# Patient Record
Sex: Male | Born: 1965 | Race: White | Hispanic: No | Marital: Married | Smoking: Current every day smoker
Health system: Southern US, Community
[De-identification: ages and names within clinical notes are randomized; demographics above are authoritative.]

## PROBLEM LIST (undated history)

## (undated) DIAGNOSIS — G8929 Other chronic pain: Secondary | ICD-10-CM

## (undated) DIAGNOSIS — Z87442 Personal history of urinary calculi: Secondary | ICD-10-CM

## (undated) DIAGNOSIS — Z720 Tobacco use: Secondary | ICD-10-CM

## (undated) DIAGNOSIS — M5432 Sciatica, left side: Secondary | ICD-10-CM

## (undated) DIAGNOSIS — I10 Essential (primary) hypertension: Secondary | ICD-10-CM

## (undated) DIAGNOSIS — E785 Hyperlipidemia, unspecified: Secondary | ICD-10-CM

## (undated) DIAGNOSIS — E669 Obesity, unspecified: Secondary | ICD-10-CM

## (undated) DIAGNOSIS — K5790 Diverticulosis of intestine, part unspecified, without perforation or abscess without bleeding: Secondary | ICD-10-CM

## (undated) DIAGNOSIS — IMO0001 Reserved for inherently not codable concepts without codable children: Secondary | ICD-10-CM

## (undated) DIAGNOSIS — M549 Dorsalgia, unspecified: Secondary | ICD-10-CM

## (undated) HISTORY — DX: Diverticulosis of intestine, part unspecified, without perforation or abscess without bleeding: K57.90

## (undated) HISTORY — PX: KNEE ARTHROSCOPY: SUR90

## (undated) HISTORY — DX: Hyperlipidemia, unspecified: E78.5

## (undated) HISTORY — PX: WISDOM TOOTH EXTRACTION: SHX21

## (undated) HISTORY — DX: Essential (primary) hypertension: I10

## (undated) HISTORY — DX: Tobacco use: Z72.0

## (undated) HISTORY — DX: Obesity, unspecified: E66.9

## (undated) HISTORY — PX: PARTIAL COLECTOMY: SHX5273

## (undated) HISTORY — PX: COLOSTOMY REVERSAL: SHX5782

---

## 2000-08-27 ENCOUNTER — Ambulatory Visit (HOSPITAL_COMMUNITY): Admission: RE | Admit: 2000-08-27 | Discharge: 2000-08-27 | Payer: Self-pay | Admitting: Family Medicine

## 2000-08-27 ENCOUNTER — Encounter: Payer: Self-pay | Admitting: Family Medicine

## 2000-10-22 ENCOUNTER — Ambulatory Visit (HOSPITAL_COMMUNITY): Admission: RE | Admit: 2000-10-22 | Discharge: 2000-10-22 | Payer: Self-pay | Admitting: Orthopaedic Surgery

## 2000-10-22 ENCOUNTER — Encounter: Payer: Self-pay | Admitting: Orthopaedic Surgery

## 2003-03-10 ENCOUNTER — Ambulatory Visit (HOSPITAL_COMMUNITY): Admission: RE | Admit: 2003-03-10 | Discharge: 2003-03-10 | Payer: Self-pay | Admitting: Internal Medicine

## 2003-04-16 ENCOUNTER — Ambulatory Visit (HOSPITAL_COMMUNITY): Admission: RE | Admit: 2003-04-16 | Discharge: 2003-04-16 | Payer: Self-pay | Admitting: Internal Medicine

## 2003-04-29 ENCOUNTER — Ambulatory Visit (HOSPITAL_COMMUNITY): Admission: RE | Admit: 2003-04-29 | Discharge: 2003-04-29 | Payer: Self-pay | Admitting: Internal Medicine

## 2003-04-30 ENCOUNTER — Inpatient Hospital Stay (HOSPITAL_COMMUNITY): Admission: AD | Admit: 2003-04-30 | Discharge: 2003-05-13 | Payer: Self-pay | Admitting: General Surgery

## 2007-07-01 ENCOUNTER — Ambulatory Visit (HOSPITAL_COMMUNITY): Admission: RE | Admit: 2007-07-01 | Discharge: 2007-07-01 | Payer: Self-pay | Admitting: Family Medicine

## 2007-07-01 ENCOUNTER — Encounter: Payer: Self-pay | Admitting: Orthopedic Surgery

## 2007-08-05 ENCOUNTER — Ambulatory Visit: Payer: Self-pay | Admitting: Orthopedic Surgery

## 2007-10-03 ENCOUNTER — Ambulatory Visit (HOSPITAL_COMMUNITY): Admission: RE | Admit: 2007-10-03 | Discharge: 2007-10-03 | Payer: Self-pay | Admitting: Family Medicine

## 2010-02-19 ENCOUNTER — Encounter (INDEPENDENT_AMBULATORY_CARE_PROVIDER_SITE_OTHER): Payer: Self-pay | Admitting: Internal Medicine

## 2010-06-17 NOTE — H&P (Signed)
NAME:  Brent Nguyen, Brent Nguyen                          ACCOUNT NO.:  [id number]   MEDICAL RECORD NO.:  [id number]                   PATIENT TYPE:  OUT   LOCATION:  RAD                                  FACILITY:  [facility]   PHYSICIAN:  Lionel December, M.D.                 DATE OF BIRTH:  11-Jun-1965   DATE OF ADMISSION:  04/16/2003  DATE OF DISCHARGE:  04/16/2003                                HISTORY & PHYSICAL   CHIEF COMPLAINT:  Follow for left lower quadrant abdominal pain and  thickened sigmoid colon.   HISTORY OF PRESENT ILLNESS:  Brent Nguyen is a 45 year old Caucasian male who was  initially seen in our office on March 12, 2003, for lower-abdominal pain.  He had an abdominal pelvic CT which showed thickening and edema to the  sigmoid colon with diverticula and a focal area of edema felt to be  contained perforation as well as thickening of adjacent bladder wall  posterosuperiorly.  He was treated with Cipro and Flagyl.  By the time I saw  him March 12, 2003, he was feeling a lot better.  He was advised to  continue antibiotic therapy for a total of two weeks.  He states that he did  fine until one week ago when he developed pain in his left lower-quadrant.  That evening, he also had multiple bowel movements.  He passed bits and  pieces of stool along with some blood.  His pain persisted for three days.  On day #3, he took Vicodin and felt better.  He states by the weekend, he  was completely pain free, and he went on a trip with his son.  As planned,  he had an abdominal CT on March 17, 200, the results of which are reviewed  below.  He is now back to having normal bowel movements.  He has a good  appetite.  He has not experienced any fever, chills, nausea or vomiting.  His weight remains unchanged.  He is on Vicodin 5/500 t.i.d. p.r.n. but he  does not take it daily.   PAST MEDICAL HISTORY:  He was evaluated in December of 1999 with colonoscopy  and ileoscopy.  At that time, he presented  with an episode of abdominal  pain, nausea, vomiting, fever, and rectal bleeding.  Colonoscopy was normal.  He had focal areas of edema and petechia of the sigmoid colon felt to be  insignificant.  Biopsy revealed mild increase in chronic inflammatory cells,  but no granulomas or crypt abscesses.  TI appeared to be unusual.  Biopsies  revealed intact villous ____, mild increase in chronic inflammatory cells.  It was decided to just watch him.  He did not have any problem until  recently.   Left knee arthroscopy.   FAMILY HISTORY:  Negative for colorectal carcinoma.  Mother had hypertension  and diabetes mellitus.  Father died of brain cancer at age 90.  SOCIAL HISTORY:  He is married.  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  He smokes 1-1/2 to 2 packs a day.  He drinks alcohol very  occasionally.  He has one daughter.   PHYSICAL EXAMINATION:  VITAL SIGNS:  Weight 213 pounds.  He is 73 inches  tall.  Pulse 88 per minute.  Blood pressure 100/72.  He is afebrile.  HEENT:  Conjunctivae is pink.  Sclerae is nonicteric.  No neck masses are  noted.  CARDIAC:  Regular rhythm, normal S1, S2.  No murmur or gallop noted.  LUNGS:  Clear to auscultation.  ABDOMEN:  Symmetrical.  Bowel sounds are normal.  Palpation reveals soft  abdomen with palpable, mildly-tender sigmoid colon.  No hepatosplenomegaly  noted.  RECTAL:  Exam is deferred.  EXTREMITIES:  He does not have clubbing or gross edema.   STUDIES:  CT from April 16, 2003, shows marked persistent bowel wall  thickening associated with sigmoid colon in the midst of diverticula.  This  has not changed since previous study of February.  The bladder wall remains  thickened.   ASSESSMENT:  Brent Nguyen is a 45 year old Caucasian male who has complicated  diverticulitis.  He is mildly tender in his left lower-quadrant, and he had  a three-day episode of left lower quadrant abdominal pain last week.  I  suspect he had wall perforation  last month.  He certainly could have Crohn's  disease in this area.  I feel he may need sigmoid colon resection unless he  has inflammatory bowel disease.  He needs to have colonoscopy in the near  future.   RECOMMENDATIONS:  We will restart him on Cipro 500 mg p.o. b.i.d. and Flagyl  500 mg p.o. b.i.d.  A prescription was given for 30 of each, along with  Vicodin 5/500 one t.i.d. p.r.n. #20, without refill.  He will undergo total  colonoscopy early part of next week.  I have reviewed the procedure and  risks with the patient, and he is agreeable.     ___________________________________________                                         Lionel December, M.D.   NR/MEDQ  D:  04/20/2003  T:  04/21/2003  Job:  [id number]   cc:   Angus G. Renard Matter, M.D.  [address]  [place]  [place] [address]  Fax: [phone]

## 2010-06-17 NOTE — Discharge Summary (Signed)
NAME:  Brent Nguyen, Brent Nguyen                          ACCOUNT NO.:  [id number]   MEDICAL RECORD NO.:  [id number]                   PATIENT TYPE:  INP   LOCATION:  IC08                                 FACILITY:  [facility]   PHYSICIAN:  Barbaraann Barthel, M.D.              DATE OF BIRTH:  Jan 09, 1966   DATE OF ADMISSION:  04/30/2003  DATE OF DISCHARGE:  05/13/2003                                 DISCHARGE SUMMARY   DIAGNOSES:  Diverticular disease.   PROCEDURE:  On May 01, 2003 sigmoid resection (low anterior resection with  end-to-end anastomosis) and on May 06, 2003 exploratory laparotomy with  sigmoid resection and colostomy and rectal stump and peritoneal lavage.   COMPLICATIONS:  Anastomotic leak.   NOTE:  This is a 45 year old white male with severe diverticular disease.  Prior to surgery he has had at least 6 episodes of recurrent diverticulitis  with bleeding and he had undergone a colonoscopy on April 29, 2003 and the  GI service had consulted [place] and we had planned for an elective sigmoid  resection due to the severity of his diverticular problems.   He was taken to surgery after appropriate antibiotic and mechanical prep and  an end-to-end anastomosis was carried out using the EEA.  This appeared  uneventful during surgery; however, on the sixth postoperative day when he  was endeavoring to have a bowel movement he became hypotensive and slightly  distended and I was fearful that he possibly had a leak.  This was confirmed  by a Gastrografin enema which showed an anastomotic leak.  He was taken to  surgery and a diverting colostomy and lavage and a resection of his area of  anastomosis was carried out.  He has essentially an end-colostomy, a  Hartmann procedure.   Postoperatively he did well after his second surgery.  His wound remained  clean without infection or any signs of dehiscence or problems of that  nature.  The stoma site was in good shape and his Jackson-Pratt drain had  minimal serosanguineous drainage.  His drains, his JP and his NG-tube were  removed on the fourth postoperative day of his second surgery.  His diet and  activity was advanced as tolerated.  I did keep him in the intensive care  unit as I wanted to watch him closely for any of these problems.  My main  worry was that he was a severe smoker and the problems of having a  dehiscence in this setting are certainly possible; however, he has not  manifested any of those clinical symptoms.  His wound has remained clean  without drainage and his stoma continues to function well and he is  tolerating p.o. well and remains afebrile with a good colostomy function.   LABORATORY DATA:  His last white count was 8.7 with an H&H of 12.8 and 36.6.  His albumin was 2.5.  His magnesium is 1.7 and he  had some mild hyponatremia  at 137, glucose 127, BUN 7 and creatinine 0.7.  His pathology from his first  surgery showed diverticulosis with an area of diverticulitis noted.  The  Gastrografin enema which was performed when we suspected the possibility of  a leak showed an anastomotic leak after which he was taken directly to  surgery.  He remained stable in the intensive care unit and has done very  well and was discharged on the seventh postoperative day from his second  surgery.   DISCHARGE INSTRUCTIONS:  He has been instructed as to colostomy care.  He is  told to clean his wound with alcohol 2 or 3 times a day.  We will follow up  with him in office.  He is told to take a multivitamin daily and to remain  well hydrated and encouraged to continue a nourishing diet.  He was given  Darvocet-N 100 one tab p.o. q.4h. p.r.n. pain.  He is off antibiotics at  present; and I see no reason to restart these as he is asymptomatic and he  had a good parenteral course of antibiotics perioperatively.  We will follow  him postoperatively with plans for a reanastomotic attempt in a couple of  months time.  We discussed this  in detail with him and his family and they  are told to contact me should they have any problems.     ___________________________________________                                         Barbaraann Barthel, M.D.   WB/MEDQ  D:  05/13/2003  T:  05/14/2003  Job:  [id number]   cc:   Barbaraann Barthel, M.D.  [address]  [place]  [place] [address]  Fax: [phone]   Lionel December, M.D.  [address]  [place]  [place] [address]  Fax: [phone]

## 2010-06-17 NOTE — Op Note (Signed)
NAME:  Brent Nguyen, Brent Nguyen                          ACCOUNT NO.:  [id number]   MEDICAL RECORD NO.:  [id number]                   PATIENT TYPE:  INP   LOCATION:  IC04                                 FACILITY:  [facility]   PHYSICIAN:  Barbaraann Barthel, M.D.              DATE OF BIRTH:  Aug 13, 1965   DATE OF PROCEDURE:  DATE OF DISCHARGE:                                 OPERATIVE REPORT   PREOPERATIVE DIAGNOSIS:  Recurrent diverticular disease.   POSTOPERATIVE DIAGNOSIS:  Recurrent diverticular disease.   PROCEDURE:  Sigmoid resection, low anterior resection with end-to-end  anastomosis EEA stapled anastomosis.   SURGEON:  Barbaraann Barthel, M.D.   NOTE:  This is a 45 year old, white male who has had at least 6 years of  problems with left lower quadrant pain with at least 2 bouts of diverticular  disease each month.  He had just undergone colonoscopy.  He was referred by  the GI service for elective sigmoid resection, after his colonoscopy, and he  came to my office.  He was somewhat tender in his left lower quadrant and  decided that he would undergo surgery after discussing complications, not  limited to, but including bleeding, infection, anastomotic leaks and the  possibility of colostomy.  Informed consent was obtained.  He was admitted,  as he had undergone a mechanical prep by Dr. Karilyn Cota the day preceding. We  did repeat that with Fleet's Phospho-Soda and gave him IV parenteral  antibiotics on his admission.  His white count was normal; he was afebrile,  we took him to surgery with the plans for an elective resection and primary  anastomosis if feasible.   GROSS OPERATIVE FINDINGS:  The patient had an area of thickened proximal  sigmoid colon; this was adhesed to the abdominal wall and it looks like, in  the past, that he had had a localized perforation in this area.  There was  no perforation at present and no active disease.  I then deemed it possible  to go ahead and attempt a  primary anastomosis.   SPECIMEN:  Proximal sigmoid colon.   DESCRIPTION OF PROCEDURE:  The patient was placed in the lithotomy position  after prepping the abdomen with Betadine solution and draping in the usual  manner and placing a Foley catheter aseptically.  A midline incision was  carried out from the pubic symphysis to the umbilicus. Again, subcutaneous  tissue was incised.  The abdomen was opened in the midline and explored with  the above findings.  The rest of the abdominal exploration was normal.   The adhesions were taken down involving the proximal sigmoid colon,  carefully and then resecting this bowel in a closed manner using a GIA  stapling device and a TA-55 stapling device to maintain no spillage.  We  freed up the left colon to give [place] more distance.  We were not able to do a  side-to-side anastomosis  as this was a low anterior resection and resected  the sigmoid almost at the pelvic brim.   After removing this specimen, and ligating the sigmoid vessels with 2-0 silk  we then placed the EEA stapling device from below in good position after  incising this to 29 mm and then placing this in the EEA device through the  rectum.  We then used the pursestring stapling device for the proximal  portion, placed the proximal cap in this pursestring, sutured the  pursestring tightly and then fired the staples with good result.  We filled  the pelvis with water, insufflated water through the rectum.  There was no  air leak noted.  Then we tacked some fat over the anastomosis, changed  gloves again, and then after irrigation closed the abdomen with #0  monofilament nonabsorbable sutures.  The subcu was irrigated and the skin  was  approximated with a staple device.  Prior to closure, all sponge,  needle, and instrument counts were found to be correct.  Estimated blood  loss was perhaps 500 cc.  The patient received 2300 cc of crystalloids.  There were no complications.       ___________________________________________                                            Barbaraann Barthel, M.D.   WB/MEDQ  D:  05/01/2003  T:  05/01/2003  Job:  [id number]   cc:   Barbaraann Barthel, M.D.  [address]  [place]  [place] [address]  Fax: [phone]   Lionel December, M.D.  [address]  [place]  [place] [address]  Fax: [phone]

## 2010-06-17 NOTE — Op Note (Signed)
NAME:  Brent Nguyen, Brent Nguyen                          ACCOUNT NO.:  [id number]   MEDICAL RECORD NO.:  [id number]                   PATIENT TYPE:  INP   LOCATION:  IC08                                 FACILITY:  [facility]   PHYSICIAN:  Barbaraann Barthel, M.D.              DATE OF BIRTH:  Aug 25, 1965   DATE OF PROCEDURE:  DATE OF DISCHARGE:                                 OPERATIVE REPORT   PREOPERATIVE DIAGNOSIS:  Status post low anterior resection for recurrent  diverticular disease and leaking anastomosis on postop day 5.   POSTOPERATIVE DIAGNOSIS:  Status post low anterior resection for recurrent  diverticular disease and leaking anastomosis on postop day 5.   PROCEDURE:  1. Exploratory laparotomy.  2. Sigmoid resection with end colostomy and abdominal and rectal stump     lavage.   SURGEON:  Barbaraann Barthel, M.D.   NOTE:  This is a 45 year old white male who was apparently doing well  postoperatively after a low anterior resection.  It was uneventful using an  EEA stapling device 5 days previously.  This morning he complained of some  testicular discomfort; did not have any particular abdominal problems; and  his abdomen was soft when I saw him and he was doing well.  Later on this  afternoon; however, he was trying to have a bowel movement, became  diaphoretic.  When I examined him this episode had passed fluid with  administration and he was afebrile; however, his abdomen appeared mildly  distended to me and it appeared a little more tender than, I thought, was  normal.  I obtained a Gastrografin enema to make sure that we did not have  an anastomotic leak; and this was indeed confirmed that we did have a leak.  We therefore reinserted the NG tube and initiated antibiotic therapy and he  was taken to surgery.   GROSS OPERATIVE FINDINGS:  The patient had a leak in the posterior part of  the EEA anastomosis site.  There was no gross fecal spillage, but there was  Gastrografin noted  emanating from this leak.  There was some mild fibrinous  exudate around the terminal ileum.  There was no abscess formation.  This  appeared to be a fairly recent leak with considerable edema and inflammation  around the area of the previous anastomosis.   TECHNIQUE:  The patient was placed in the supine position and after the  adequate administration of anesthesia he was prepped with Betadine solution  and draped in the usual manner.  The staples were removed prior to prepping  and draping.  The wound was then opened and irrigated and we opened the  abdominal cavity with the above findings.  I placed a TA 55 stapler over the  anastomosis and oversewed the rectal stump after irrigating the rectal stump  through the perforation site.   The anastomosis site was then likewise stapled with a DA-55 removing a  small  portion of sigmoid colon and then we placed this though an opening laterally  for the colostomy site.  There was considerable edema and it was somewhat  difficult to get this through the fascial opening.  We did, however, do  this.  We had had to free up the left colon, somewhat, as well.  After  irrigating and checking for hemostasis we then closed the abdomen after  copious irrigation after placing 2 JP drains along side the rectal stump.  We then closed the fascia with interrupted #0 Prolene sutures.  We loosely  stapled the skin placing saline soaked iodoform ribbon gauze between the  staple line and closing the wound with an OpSite dressing.   Then, after the wound was isolated we then matured the colostomy. We tacked  the colostomy to the fascia with 3-0 GI silk and then matured the colostomy  with 4-0 chromic.  A Hollister appliance was applied.  The drain sites were  sutured in place with 3-0 Nylon.  This small portion of sigmoid colon was  not sent for a specimen.  Prior to closure all sponge, needle, and  instrument counts were found to be correct.  The patient  received  approximately 3100 cc of crystalloids intraoperatively.  As stated, 2 drains  were placed in the pelvis.  There were no complications to endotracheal  intubation.   The patient was taken to the recovery room in satisfactory condition.  Postoperatively his H&H was 14.9 and 43.8.  His blood pressure was 141/89  and his heart rate was 116.  We will following him closely in the intensive  care unit.      ___________________________________________                                            Barbaraann Barthel, M.D.   WB/MEDQ  D:  05/06/2003  T:  05/07/2003  Job:  [id number]   cc:   Barbaraann Barthel, M.D.  [address]  [place]  [place] [address]  Fax: [phone]   Lionel December, M.D.  [address]    [place] [address]  Fax: [phone]

## 2010-06-17 NOTE — Op Note (Signed)
NAME:  Brent Nguyen, Brent Nguyen                          ACCOUNT NO.:  [id number]   MEDICAL RECORD NO.:  [id number]                   PATIENT TYPE:  AMB   LOCATION:  DAY                                  FACILITY:  [facility]   PHYSICIAN:  Lionel December, M.D.                 DATE OF BIRTH:  1965-04-24   DATE OF PROCEDURE:  04/29/2003  DATE OF DISCHARGE:                                 OPERATIVE REPORT   PROCEDURE:  Total colonoscopy.   INDICATIONS FOR PROCEDURE:  Brent Nguyen is a 45 year old Caucasian male with  recurrent left lower quadrant pain and an abnormal abdominopelvic CT.  He  was felt to have diverticulitis and treated with a two-week course of Cipro  and Flagyl.  He had a followup CT, and thickening to the sigmoid colon and  bladder wall was unchanged.  He was retreated when seen last week.  He is  undergoing colonoscopy to make sure he does not have a neoplastic process or  inflammatory bowel disease.  The procedure and risks were reviewed with the  patient, and informed consent was obtained.   PREOPERATIVE MEDICATIONS:  Demerol 50 mg IV, Versed 5 mg IV in divided dose.   FINDINGS:  The procedure was performed in the endoscopy suite.  The  patient's vital signs and O2 saturations were monitored during the procedure  and remained stable.  The patient was placed in the left lateral recumbent  position and rectal examination performed.  No abnormality noted on external  or digital exam.  The Olympus videoscope was placed into the rectum and  advanced into the region of the sigmoid colon where there were focal areas  of mucosal edema and intense erythema along with scattered diverticula.  The  rest of the mucosa was normal but was spastic.  There was no stricture or  mass.  The scope was advanced to the descending colon and advanced without  any difficulty into the cecum which was identified by the appendiceal  orifice and ileocecal valve.  A short segment of TI was also examined and  was  normal.  As the scope was withdrawn, the colonic mucosa was once again  carefully examined.  There were no other abnormalities, except in the  sigmoid colon.  Biopsy was taken from this abnormal mucosa at the sigmoid  colon.  The rectal mucosa was normal.  The scope was retroflexed to examine  the anorectal junction which was unremarkable.  The endoscope was  straightened and withdrawn.  The patient tolerated the procedure well.   FINAL DIAGNOSES:  1. Normal terminal ileum.  2. Sigmoid colon diverticulosis with spastic segment and focal areas of     colitis, i.e., mucosal edema and erythema.  This change is possibly     nonspecific.  Biopsy taken to make sure this is not Crohn's disease.   RECOMMENDATIONS:  Will leave him on Cipro and Flagyl and get a  surgical  consultation for sigmoid colon resection.      ___________________________________________                                            Lionel December, M.D.   NR/MEDQ  D:  04/29/2003  T:  04/29/2003  Job:  [id number]   cc:   Angus G. Renard Matter, M.D.  [address]  [place]  [place] [address]  Fax: [phone]

## 2012-05-02 ENCOUNTER — Ambulatory Visit (HOSPITAL_COMMUNITY)
Admission: RE | Admit: 2012-05-02 | Discharge: 2012-05-02 | Disposition: A | Discharge status: Home / Self Care | Payer: BC Managed Care – PPO | Source: Ambulatory Visit | Attending: Family Medicine | Admitting: Family Medicine

## 2012-05-02 ENCOUNTER — Other Ambulatory Visit (HOSPITAL_COMMUNITY): Payer: Self-pay | Admitting: Family Medicine

## 2012-05-02 DIAGNOSIS — M546 Pain in thoracic spine: Secondary | ICD-10-CM | POA: Insufficient documentation

## 2012-05-02 DIAGNOSIS — M545 Low back pain, unspecified: Secondary | ICD-10-CM | POA: Insufficient documentation

## 2012-05-02 DIAGNOSIS — M51379 Other intervertebral disc degeneration, lumbosacral region without mention of lumbar back pain or lower extremity pain: Secondary | ICD-10-CM | POA: Insufficient documentation

## 2012-05-02 DIAGNOSIS — M5137 Other intervertebral disc degeneration, lumbosacral region: Secondary | ICD-10-CM | POA: Insufficient documentation

## 2012-06-11 ENCOUNTER — Other Ambulatory Visit (HOSPITAL_COMMUNITY): Payer: Self-pay | Admitting: Family Medicine

## 2012-06-11 DIAGNOSIS — M545 Low back pain: Secondary | ICD-10-CM

## 2012-06-13 ENCOUNTER — Ambulatory Visit (HOSPITAL_COMMUNITY)
Admission: RE | Admit: 2012-06-13 | Discharge: 2012-06-13 | Disposition: A | Discharge status: Home / Self Care | Payer: BC Managed Care – PPO | Source: Ambulatory Visit | Attending: Family Medicine | Admitting: Family Medicine

## 2012-06-13 DIAGNOSIS — M545 Low back pain, unspecified: Secondary | ICD-10-CM | POA: Insufficient documentation

## 2012-06-13 DIAGNOSIS — M5126 Other intervertebral disc displacement, lumbar region: Secondary | ICD-10-CM | POA: Insufficient documentation

## 2012-06-13 DIAGNOSIS — M79609 Pain in unspecified limb: Secondary | ICD-10-CM | POA: Insufficient documentation

## 2012-06-19 ENCOUNTER — Other Ambulatory Visit: Payer: Self-pay | Admitting: Family Medicine

## 2012-06-19 DIAGNOSIS — M545 Low back pain: Secondary | ICD-10-CM

## 2012-07-01 ENCOUNTER — Ambulatory Visit
Admission: RE | Admit: 2012-07-01 | Discharge: 2012-07-01 | Disposition: A | Discharge status: Home / Self Care | Payer: BC Managed Care – PPO | Source: Ambulatory Visit | Attending: Family Medicine | Admitting: Family Medicine

## 2012-07-01 ENCOUNTER — Other Ambulatory Visit: Payer: Self-pay | Admitting: Family Medicine

## 2012-07-01 DIAGNOSIS — M545 Low back pain, unspecified: Secondary | ICD-10-CM

## 2012-07-01 MED ORDER — IOHEXOL 180 MG/ML  SOLN
1.0000 mL | Freq: Once | INTRAMUSCULAR | Status: AC | PRN
  Administered 2012-07-01: 1 mL via EPIDURAL

## 2012-07-01 MED ORDER — METHYLPREDNISOLONE ACETATE 40 MG/ML INJ SUSP (RADIOLOG
120.0000 mg | Freq: Once | INTRAMUSCULAR | Status: AC
  Administered 2012-07-01: 120 mg via EPIDURAL

## 2012-07-01 NOTE — Discharge Instructions (Signed)

## 2012-09-12 ENCOUNTER — Encounter: Payer: Self-pay | Admitting: Cardiovascular Disease

## 2012-09-12 ENCOUNTER — Ambulatory Visit (INDEPENDENT_AMBULATORY_CARE_PROVIDER_SITE_OTHER): Payer: BC Managed Care – PPO | Admitting: Cardiovascular Disease

## 2012-09-12 VITALS — BP 146/88 | HR 72 | Ht 73.0 in | Wt 231.3 lb

## 2012-09-12 DIAGNOSIS — Z720 Tobacco use: Secondary | ICD-10-CM

## 2012-09-12 DIAGNOSIS — R079 Chest pain, unspecified: Secondary | ICD-10-CM

## 2012-09-12 DIAGNOSIS — E669 Obesity, unspecified: Secondary | ICD-10-CM

## 2012-09-12 DIAGNOSIS — F17200 Nicotine dependence, unspecified, uncomplicated: Secondary | ICD-10-CM

## 2012-09-12 DIAGNOSIS — I10 Essential (primary) hypertension: Secondary | ICD-10-CM

## 2012-09-12 MED ORDER — CARVEDILOL 6.25 MG PO TABS: 6.2500 mg | ORAL_TABLET | Freq: Two times a day (BID) | ORAL | Status: DC

## 2012-09-12 NOTE — Patient Instructions (Addendum)
Start Carvedilol 6.25mg  twice a day.  Your physician recommends that you schedule a follow-up appointment in: *One Month

## 2012-09-13 ENCOUNTER — Telehealth: Payer: Self-pay | Admitting: Cardiovascular Disease

## 2012-09-13 NOTE — Telephone Encounter (Signed)
Call to pt.  Pt informed call received from Dr. Renard Matter' office asking for a note he was seen yesterday.  Pt stated he did call them b/c he needed a letter from them stating he saw them last week and they referred him to our office.  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  Pt informed RN will call Dr. Renard Matter' office back and inform them.  Pt verbalized understanding and agreed w/ plan.  No phone number left for Dr. Lorenso Courier office.  Call to pt and number given: [phone]  Returned call and spoke w/ Ginger.  Informed per pt.  Verbalized understanding and stated they will take care of it.

## 2012-09-13 NOTE — Telephone Encounter (Signed)
Dr. Renard Matter office needs a note stating that Brent Nguyen was seen here on 09/12/12 for his employer.

## 2012-09-17 ENCOUNTER — Encounter: Payer: Self-pay | Admitting: Cardiovascular Disease

## 2012-09-17 DIAGNOSIS — I10 Essential (primary) hypertension: Secondary | ICD-10-CM | POA: Insufficient documentation

## 2012-09-17 DIAGNOSIS — Z720 Tobacco use: Secondary | ICD-10-CM | POA: Insufficient documentation

## 2012-09-17 DIAGNOSIS — E669 Obesity, unspecified: Secondary | ICD-10-CM | POA: Insufficient documentation

## 2012-09-17 NOTE — Progress Notes (Signed)
Patient ID: Brent Nguyen, male   DOB: November 24, 1965, 47 y.o.   MRN: [id number]     Reason for office visit Hypertension  Brent Nguyen is a previously healthy but obese 47 year old man was recently had a variety of new complaints. He has been feeling lightheaded with a strange tingling sensation in his lower lip and tongue, has had blurred vision and on at least one occasion had chest pain during arguments. His symptoms most commonly occur when he is at work and he is very upset. They have a number of changes in the workplace that he feels are wrong and he has not receiving the support that he expects from his managers.  One day when he was feeling particularly lightheaded and ill he had his blood pressure checked by the facility nurse and his diastolic blood pressure was well over 100 mm Hg. Subsequently at home after taking a bath he felt extremely hot and flushed and done well and checked his blood pressure. He found to be 157/113 mm Hg. Only in one occasion last several months once his blood pressure recorded as normal.  His mother has high blood pressure but otherwise the family history is not particularly prominent for this disorder. There is no family history of premature cardiovascular illness.  Review of systemsdoes not have features to suggest obstructive sleep apnea. His diet generally has been unhealthy until recently when he began paying more attention to portion sizes, fat and sodium intake. He has actually managed to lose about 30 pounds of weight in the last few months. He remains obese with a BMI just over 30.  Other than the one episode at work when he had chest pain argument has not control by chest pain. He can perform fairly intense physical activity without chest discomfort or shortness of breath. Not engage in regular exercise for his health but does have to walk very long distances at work. He does not feel in any way limited.  He is an active smoker with an approximately  25-pack-year history of smoking. He is seriously considering smoking cessation has been thinking about using any cigarettes as a way to wean himself off this habit.   Allergies  Allergen Reactions  . Penicillins     Current Outpatient Prescriptions  Medication Sig Dispense Refill  . carvedilol (COREG) 6.25 MG tablet Take 1 tablet (6.25 mg total) by mouth 2 (two) times daily.  60 tablet  3   No current facility-administered medications for this visit.    Past Medical History  Diagnosis Date  . Stomach problems   . Hyperlipidemia   . Hypertension     Surgical history: He had a partial colectomy secondary to a ruptured colon from diverticulitis in 2005. He subsequently had 2 surgeries to reverse his colostomy. Has also had remote knee surgery and wisdom tooth extraction.  Family History  Problem Relation Age of Onset  . Hypertension Mother   . Diverticulosis Mother   . Sleep apnea Mother   . Celiac disease Mother   . Ulcers Mother   . Cancer Father     Brain  . Diverticulosis Brother   . Ulcers Brother   . Celiac disease Brother   . Hypotension Brother   . Cancer Maternal Grandmother     Breast  . Ulcers Maternal Grandfather   . ALS Maternal Grandfather   . Diabetes Maternal Grandfather   . Heart attack Paternal Grandmother   . Hypertension Paternal Grandmother   . Cancer Paternal Grandfather  Melanoma  . Diverticulosis Daughter     History   Social History  . Marital Status: Married    Spouse Name: N/A    Number of Children: N/A  . Years of Education: N/A   Occupational History  . Not on file.   Social History Main Topics  . Smoking status: Current Every Day Smoker -- 1.00 packs/day for 25 years    Types: Cigarettes  . Smokeless tobacco: Former Neurosurgeon  . Alcohol Use: Yes     Comment: On occasion, 1 or 2 beers per month  . Drug Use: No  . Sexual Activity: Not on file   Other Topics Concern  . Not on file   Social History Narrative  . No narrative  on file    Review of systems: The patient specifically denies any chest painwith exertion, dyspnea at rest or with exertion, orthopnea, paroxysmal nocturnal dyspnea, syncope, palpitations, focal neurological deficits, intermittent claudication, lower extremity edema, unexplained weight gain, cough, hemoptysis or wheezing. He denies erectile dysfunction.  The patient also denies abdominal pain, nausea, vomiting, dysphagia, diarrhea, constipation, polyuria, polydipsia, dysuria, hematuria, frequency, urgency, abnormal bleeding or bruising, fever, chills, unexpected weight changes, mood swings, change in skin or hair texture, change in voice quality, auditory or visual problems, allergic reactions or rashes, new musculoskeletal complaints other than usual "aches and pains".   PHYSICAL EXAM BP 146/88  Pulse 72  Ht 6\' 1"  (1.854 m)  Wt 231 lb 4.8 oz (104.917 kg)  BMI 30.52 kg/m2 When I rechecked his blood pressure it was much worse at 163/124 mm Hg. I waited another 10 minutes and his blood pressure was essentially the same. There is no difference in blood pressure recordings in his left and right upper tremor  General: Alert, oriented x3, no distress Head: no evidence of trauma, PERRL, EOMI, no exophtalmos or lid lag, no myxedema, no xanthelasma; normal ears, nose and oropharynx Neck: normal jugular venous pulsations and no hepatojugular reflux; brisk carotid pulses without delay and no carotid bruits Chest: clear to auscultation, no signs of consolidation by percussion or palpation, normal fremitus, symmetrical and full respiratory excursions Cardiovascular: normal position and quality of the apical impulse, regular rhythm, normal first and second heart sounds, no murmurs, rubs or gallops Abdomen: no tenderness or distention, no masses by palpation, no abnormal pulsatility or arterial bruits, normal bowel sounds, no hepatosplenomegaly Extremities: no clubbing, cyanosis or edema; 2+ radial, ulnar  and brachial pulses bilaterally; 2+ right femoral, posterior tibial and dorsalis pedis pulses; 2+ left femoral, posterior tibial and dorsalis pedis pulses; no subclavian or femoral bruits Neurological: grossly nonfocal  EKG: Sinus rhythm, no evidence of left ventricular hypertrophy    ASSESSMENT AND PLAN No problem-specific assessment & plan notes found for this encounter.  Brent Nguyen has severe systemic hypertension. This appears to be partly situational related to anger and workplace conflict. However he has severely elevated diastolic blood pressure which is likely to be very deleterious in the long run.  Start a beta blocker. Retrieve his lab tests for renal function primarily. Consider renal duplex ultrasound of his blood pressure becomes difficult to manage. Discussed sodium restriction at length. Reviewed need to exercise on a regular basis and continue his efforts at weight loss.   Encouraged him in his decision to try to quit smoking. We discussed several modalities to achieve this at length.   Orders Placed This Encounter  Procedures  . EKG 12-Lead   Meds ordered this encounter  Medications  . DISCONTD:  carvedilol (COREG) 6.25 MG tablet    Sig: Take 1 tablet (6.25 mg total) by mouth 2 (two) times daily.    Dispense:  180 tablet    Refill:  3  . carvedilol (COREG) 6.25 MG tablet    Sig: Take 1 tablet (6.25 mg total) by mouth 2 (two) times daily.    Dispense:  60 tablet    Refill:  3    Avry Roedl  Thurmon Fair, MD, [facility] [phone] office [phone] pager

## 2012-10-15 ENCOUNTER — Ambulatory Visit (INDEPENDENT_AMBULATORY_CARE_PROVIDER_SITE_OTHER): Payer: BC Managed Care – PPO | Admitting: Cardiovascular Disease

## 2012-10-15 ENCOUNTER — Encounter: Payer: Self-pay | Admitting: Cardiovascular Disease

## 2012-10-15 VITALS — BP 138/86 | HR 68 | Resp 16 | Ht 73.0 in | Wt 240.3 lb

## 2012-10-15 DIAGNOSIS — I10 Essential (primary) hypertension: Secondary | ICD-10-CM

## 2012-10-15 DIAGNOSIS — F17200 Nicotine dependence, unspecified, uncomplicated: Secondary | ICD-10-CM

## 2012-10-15 DIAGNOSIS — Z720 Tobacco use: Secondary | ICD-10-CM

## 2012-10-15 NOTE — Assessment & Plan Note (Signed)
He feels substantially better. He seems to be tolerating the carvedilol without any side effects. I think this medication is a good long-term choice for him. We did again review the fact that he would also benefit from sodium restriction and weight loss.

## 2012-10-15 NOTE — Patient Instructions (Addendum)
Your physician recommends that you schedule a follow-up appointment in: One year.  Your physician discussed the hazards of tobacco use. Tobacco use cessation is recommended and techniques and options to help you quit were discussed.

## 2012-10-15 NOTE — Assessment & Plan Note (Addendum)
Smoking cessation should be a very important short and long-term goal for him. I advised him again to set a date for smoking cessation and work steadily towards achieving this. He politely agreed but did not really commit.

## 2012-10-15 NOTE — Progress Notes (Signed)
Patient ID: Brent Nguyen, male   DOB: [birth year], 47 y.o.   MRN: [id number]    Reason for office visit Followup hypertension  Brent Nguyen feels a lot better. He states that after starting treatment with a beta blocker he no longer has rapid palpitations and the sensation of a surgeons at his chest but he did before. He has also been making an effort to avoid arguments at work. While he was at the beach she felt great. He checked his blood pressure repeatedly with his mothers blood pressure monitor and routinely had readings in the normal range. He has not had problems with fatigue, sleep disturbance, dizziness or erectile dysfunction.    Allergies  Allergen Reactions  . Penicillins     Current Outpatient Prescriptions  Medication Sig Dispense Refill  . carvedilol (COREG) 6.25 MG tablet Take 1 tablet (6.25 mg total) by mouth 2 (two) times daily.  60 tablet  3  . cyclobenzaprine (FLEXERIL) 10 MG tablet Take 10 mg by mouth 3 (three) times daily as needed for muscle spasms.      Marland Kitchen HYDROcodone-acetaminophen (LORTAB) 10-500 MG per tablet Take 1 tablet by mouth.       No current facility-administered medications for this visit.    Past Medical History  Diagnosis Date  . Stomach problems   . Hyperlipidemia   . Hypertension     Past Surgical History  Procedure Laterality Date  . Partial colectomy      2 subsequent surgeries to reverse his colostomy    Family History  Problem Relation Age of Onset  . Hypertension Mother   . Diverticulosis Mother   . Sleep apnea Mother   . Celiac disease Mother   . Ulcers Mother   . Cancer Father     Brain  . Diverticulosis Brother   . Ulcers Brother   . Celiac disease Brother   . Hypotension Brother   . Cancer Maternal Grandmother     Breast  . Ulcers Maternal Grandfather   . ALS Maternal Grandfather   . Diabetes Maternal Grandfather   . Heart attack Paternal Grandmother   . Hypertension Paternal Grandmother   . Cancer Paternal Grandfather      Melanoma  . Diverticulosis Daughter     History   Social History  . Marital Status: Married    Spouse Name: N/A    Number of Children: N/A  . Years of Education: N/A   Occupational History  . Not on file.   Social History Main Topics  . Smoking status: Current Every Day Smoker -- 1.00 packs/day for 25 years    Types: Cigarettes  . Smokeless tobacco: Former Neurosurgeon  . Alcohol Use: Yes     Comment: On occasion, 1 or 2 beers per month  . Drug Use: No  . Sexual Activity: Not on file   Other Topics Concern  . Not on file   Social History Narrative  . No narrative on file    Review of systems: The patient specifically denies any chest pain at rest or with exertion, dyspnea at rest or with exertion, orthopnea, paroxysmal nocturnal dyspnea, syncope, palpitations, focal neurological deficits, intermittent claudication, lower extremity edema, unexplained weight gain, cough, hemoptysis or wheezing.  The patient also denies abdominal pain, nausea, vomiting, dysphagia, diarrhea, constipation, polyuria, polydipsia, dysuria, hematuria, frequency, urgency, abnormal bleeding or bruising, fever, chills, unexpected weight changes, mood swings, change in skin or hair texture, change in voice quality, auditory or visual problems, allergic reactions or rashes, new  musculoskeletal complaints other than usual "aches and pains".   PHYSICAL EXAM BP 138/86  Pulse 68  Resp 16  Ht 6\' 1"  (1.854 m)  Wt 240 lb 4.8 oz (108.999 kg)  BMI 31.71 kg/m2  General: Alert, oriented x3, no distress Head: no evidence of trauma, PERRL, EOMI, no exophtalmos or lid lag, no myxedema, no xanthelasma; normal ears, nose and oropharynx Neck: normal jugular venous pulsations and no hepatojugular reflux; brisk carotid pulses without delay and no carotid bruits Chest: clear to auscultation, no signs of consolidation by percussion or palpation, normal fremitus, symmetrical and full respiratory excursions Cardiovascular:  normal position and quality of the apical impulse, regular rhythm, normal first and second heart sounds, no murmurs, rubs or gallops Abdomen: no tenderness or distention, no masses by palpation, no abnormal pulsatility or arterial bruits, normal bowel sounds, no hepatosplenomegaly Extremities: no clubbing, cyanosis or edema; 2+ radial, ulnar and brachial pulses bilaterally; 2+ right femoral, posterior tibial and dorsalis pedis pulses; 2+ left femoral, posterior tibial and dorsalis pedis pulses; no subclavian or femoral bruits Neurological: grossly nonfocal   ASSESSMENT AND PLAN HTN (hypertension), severe He feels substantially better. He seems to be tolerating the carvedilol without any side effects. I think this medication is a good long-term choice for him. We did again review the fact that he would also benefit from sodium restriction and weight loss.  Tobacco abuse Smoking cessation should be a very important short and long-term goal for him. I advised him again to set a date for smoking cessation and work steadily towards achieving this. He politely agreed but did not really commit.   No orders of the defined types were placed in this encounter.   Meds ordered this encounter  Medications  . HYDROcodone-acetaminophen (LORTAB) 10-500 MG per tablet    Sig: Take 1 tablet by mouth.  . cyclobenzaprine (FLEXERIL) 10 MG tablet    Sig: Take 10 mg by mouth 3 (three) times daily as needed for muscle spasms.    Junious Silk, MD, [facility] [phone] office [phone] pager

## 2012-12-31 ENCOUNTER — Ambulatory Visit (HOSPITAL_COMMUNITY)
Admission: RE | Admit: 2012-12-31 | Discharge: 2012-12-31 | Disposition: A | Discharge status: Home / Self Care | Payer: BC Managed Care – PPO | Source: Ambulatory Visit | Attending: Family Medicine | Admitting: Family Medicine

## 2012-12-31 ENCOUNTER — Other Ambulatory Visit (HOSPITAL_COMMUNITY): Payer: Self-pay | Admitting: Family Medicine

## 2012-12-31 DIAGNOSIS — S8991XA Unspecified injury of right lower leg, initial encounter: Secondary | ICD-10-CM

## 2012-12-31 DIAGNOSIS — W010XXA Fall on same level from slipping, tripping and stumbling without subsequent striking against object, initial encounter: Secondary | ICD-10-CM | POA: Insufficient documentation

## 2012-12-31 DIAGNOSIS — M25569 Pain in unspecified knee: Secondary | ICD-10-CM | POA: Insufficient documentation

## 2012-12-31 DIAGNOSIS — R609 Edema, unspecified: Secondary | ICD-10-CM

## 2012-12-31 DIAGNOSIS — S8990XA Unspecified injury of unspecified lower leg, initial encounter: Secondary | ICD-10-CM | POA: Insufficient documentation

## 2013-01-01 ENCOUNTER — Other Ambulatory Visit (HOSPITAL_COMMUNITY): Payer: Self-pay | Admitting: Orthopaedic Surgery

## 2013-01-01 ENCOUNTER — Ambulatory Visit (HOSPITAL_COMMUNITY)
Admission: RE | Admit: 2013-01-01 | Discharge: 2013-01-01 | Disposition: A | Discharge status: Home / Self Care | Payer: BC Managed Care – PPO | Source: Ambulatory Visit | Attending: Orthopaedic Surgery | Admitting: Orthopaedic Surgery

## 2013-01-01 ENCOUNTER — Encounter (HOSPITAL_COMMUNITY): Payer: Self-pay

## 2013-01-01 DIAGNOSIS — M7120 Synovial cyst of popliteal space [Baker], unspecified knee: Secondary | ICD-10-CM | POA: Insufficient documentation

## 2013-01-01 DIAGNOSIS — R52 Pain, unspecified: Secondary | ICD-10-CM

## 2013-01-01 DIAGNOSIS — R609 Edema, unspecified: Secondary | ICD-10-CM

## 2013-01-01 DIAGNOSIS — IMO0002 Reserved for concepts with insufficient information to code with codable children: Secondary | ICD-10-CM | POA: Insufficient documentation

## 2013-01-01 DIAGNOSIS — X58XXXA Exposure to other specified factors, initial encounter: Secondary | ICD-10-CM | POA: Insufficient documentation

## 2013-01-01 DIAGNOSIS — M7989 Other specified soft tissue disorders: Secondary | ICD-10-CM | POA: Insufficient documentation

## 2013-01-01 DIAGNOSIS — M25569 Pain in unspecified knee: Secondary | ICD-10-CM | POA: Insufficient documentation

## 2013-01-01 DIAGNOSIS — M79609 Pain in unspecified limb: Secondary | ICD-10-CM | POA: Insufficient documentation

## 2013-01-17 ENCOUNTER — Other Ambulatory Visit: Payer: Self-pay | Admitting: Radiology

## 2013-01-17 ENCOUNTER — Encounter (HOSPITAL_COMMUNITY): Payer: Self-pay

## 2013-01-17 ENCOUNTER — Encounter (HOSPITAL_COMMUNITY)
Admission: RE | Admit: 2013-01-17 | Discharge: 2013-01-17 | Disposition: A | Discharge status: Home / Self Care | Payer: BC Managed Care – PPO | Source: Ambulatory Visit | Attending: Orthopaedic Surgery | Admitting: Orthopaedic Surgery

## 2013-01-17 ENCOUNTER — Encounter (HOSPITAL_COMMUNITY): Payer: Self-pay | Admitting: Pharmacy Technician

## 2013-01-17 LAB — CBC WITH DIFFERENTIAL/PLATELET
Eosinophils Absolute: 0.4 10*3/uL (ref 0.0–0.7)
Eosinophils Relative: 4 % (ref 0–5)
HCT: 45.8 % (ref 39.0–52.0)
Hemoglobin: 15.9 g/dL (ref 13.0–17.0)
Lymphocytes Relative: 26 % (ref 12–46)
Lymphs Abs: 2.4 10*3/uL (ref 0.7–4.0)
MCH: 30.9 pg (ref 26.0–34.0)
MCV: 88.9 fL (ref 78.0–100.0)
Monocytes Absolute: 0.6 10*3/uL (ref 0.1–1.0)
Monocytes Relative: 6 % (ref 3–12)
Platelets: 252 10*3/uL (ref 150–400)
RBC: 5.15 MIL/uL (ref 4.22–5.81)
WBC: 9.3 10*3/uL (ref 4.0–10.5)

## 2013-01-17 LAB — COMPREHENSIVE METABOLIC PANEL
ALT: 24 U/L (ref 0–53)
BUN: 12 mg/dL (ref 6–23)
CO2: 26 mEq/L (ref 19–32)
Calcium: 9.8 mg/dL (ref 8.4–10.5)
Creatinine, Ser: 0.82 mg/dL (ref 0.50–1.35)
GFR calc Af Amer: >90 mL/min (ref >90)
GFR calc non Af Amer: >90 mL/min (ref >90)
Glucose, Bld: 93 mg/dL (ref 70–99)
Sodium: 138 mEq/L (ref 135–145)
Total Protein: 7.4 g/dL (ref 6.0–8.3)

## 2013-01-17 LAB — URINALYSIS, ROUTINE W REFLEX MICROSCOPIC
Bilirubin Urine: NEGATIVE
Leukocytes, UA: NEGATIVE
Nitrite: NEGATIVE
Specific Gravity, Urine: 1.02 (ref 1.005–1.030)
Urobilinogen, UA: 0.2 mg/dL (ref 0.0–1.0)
pH: 6 (ref 5.0–8.0)

## 2013-01-17 NOTE — H&P (Signed)
Brent Nguyen is an 47 y.o. male.   Chief Complaint: Right knee pain  HPI: He has had right knee pain since early November.  It has gotten worse.  He has swelling, popping and giving way.  He did not improve with NSAIDs. He saw Dr. Renard Matter.  He had swelling of the calf as well.  I had a Doppler done to rule out clot but Doppler was negative.   MRI of the knee on 01/01/13 showed radial tear of the medial meniscus and large Baker's cyst with fluid extruding into the posterior calf.  He was advised to have arthroscopy of the right knee.  He wanted to wait until now.    I have gone over the risks and imponderables of this elective outpatient procedure.  He understands and asked appropriate questions.  He will need PT post operative.  Past Medical History  Diagnosis Date  . Stomach problems   . Hyperlipidemia   . Hypertension     Past Surgical History  Procedure Laterality Date  . Partial colectomy      2 subsequent surgeries to reverse his colostomy    Family History  Problem Relation Age of Onset  . Hypertension Mother   . Diverticulosis Mother   . Sleep apnea Mother   . Celiac disease Mother   . Ulcers Mother   . Cancer Father     Brain  . Diverticulosis Brother   . Ulcers Brother   . Celiac disease Brother   . Hypotension Brother   . Cancer Maternal Grandmother     Breast  . Ulcers Maternal Grandfather   . ALS Maternal Grandfather   . Diabetes Maternal Grandfather   . Heart attack Paternal Grandmother   . Hypertension Paternal Grandmother   . Cancer Paternal Grandfather     Melanoma  . Diverticulosis Daughter    Social History:  reports that he has been smoking Cigarettes.  He has a 25 pack-year smoking history. He has quit using smokeless tobacco. He reports that he drinks alcohol. He reports that he does not use illicit drugs.  Allergies:  Allergies  Allergen Reactions  . Penicillins     No prescriptions prior to admission    No results found for this or any  previous visit (from the past 48 hour(s)). No results found.  Review of Systems  Respiratory:       Smokes  Musculoskeletal: Positive for joint pain (Right knee pain with swelling, popping and giving way.).  All other systems reviewed and are negative.    There were no vitals taken for this visit. Physical Exam  Constitutional: He is oriented to person, place, and time. He appears well-developed and well-nourished.  HENT:  Head: Normocephalic and atraumatic.  Eyes: Conjunctivae and EOM are normal. Pupils are equal, round, and reactive to light.  Neck: Normal range of motion. Neck supple.  Cardiovascular: Normal rate, regular rhythm, normal heart sounds and intact distal pulses.   Respiratory: Effort normal. He has wheezes (Few wheezes).  GI: Soft. Bowel sounds are normal.  Musculoskeletal: He exhibits tenderness (Pain right knee with effusion and medial joint line pain.).       Right knee: He exhibits decreased range of motion, swelling and effusion. Tenderness found. Medial joint line tenderness noted.       Legs: Neurological: He is alert and oriented to person, place, and time. He has normal reflexes.  Skin: Skin is warm and dry.  Psychiatric: He has a normal mood and affect. His  behavior is normal. Judgment and thought content normal.     Assessment/Plan Tear of the right knee medial meniscus.  For outpatient arthroscopy of the right knee.  Shandora Koogler 01/17/2013, 11:07 AM

## 2013-01-17 NOTE — Patient Instructions (Signed)
Quanell Loughney Alber  01/17/2013   Your procedure is scheduled on:  01/20/2013  Report to [facility] at  615  AM.  Call this number if you have problems the morning of surgery: [phone]   Remember:   Do not eat food or drink liquids after midnight.   Take these medicines the morning of surgery with A SIP OF WATER: coreg, lortab, flexaril   Do not wear jewelry, make-up or nail polish.  Do not wear lotions, powders, or perfumes.   Do not shave 48 hours prior to surgery. Men may shave face and neck.  Do not bring valuables to the hospital.  [facility] is not responsible for any belongings or valuables.               Contacts, dentures or bridgework may not be worn into surgery.  Leave suitcase in the car. After surgery it may be brought to your room.  For patients admitted to the hospital, discharge time is determined by your treatment team.               Patients discharged the day of surgery will not be allowed to drive home.  Name and phone number of your driver: family  Special Instructions: Shower using CHG 2 nights before surgery and the night before surgery.  If you shower the day of surgery use CHG.  Use special wash - you have one bottle of CHG for all showers.  You should use approximately 1/3 of the bottle for each shower.   Please read over the following fact sheets that you were given: Pain Booklet, Coughing and Deep Breathing, Surgical Site Infection Prevention, Anesthesia Post-op Instructions and Care and Recovery After Surgery Arthroscopic Procedure, Knee An arthroscopic procedure can find what is wrong with your knee. PROCEDURE Arthroscopy is a surgical technique that allows your orthopedic surgeon to diagnose and treat your knee injury with accuracy. They will look into your knee through a small instrument. This is almost like a small (pencil sized) telescope. Because arthroscopy affects your knee less than open knee surgery, you can anticipate a more rapid  recovery. Taking an active role by following your caregiver's instructions will help with rapid and complete recovery. Use crutches, rest, elevation, ice, and knee exercises as instructed. The length of recovery depends on various factors including type of injury, age, physical condition, medical conditions, and your rehabilitation. Your knee is the joint between the large bones (femur and tibia) in your leg. Cartilage covers these bone ends which are smooth and slippery and allow your knee to bend and move smoothly. Two menisci, thick, semi-lunar shaped pads of cartilage which form a rim inside the joint, help absorb shock and stabilize your knee. Ligaments bind the bones together and support your knee joint. Muscles move the joint, help support your knee, and take stress off the joint itself. Because of this all programs and physical therapy to rehabilitate an injured or repaired knee require rebuilding and strengthening your muscles. AFTER THE PROCEDURE  After the procedure, you will be moved to a recovery area until most of the effects of the medication have worn off. Your caregiver will discuss the test results with you.  Only take over-the-counter or prescription medicines for pain, discomfort, or fever as directed by your caregiver. SEEK MEDICAL CARE IF:   You have increased bleeding from your wounds.  You see redness, swelling, or have increasing pain in your wounds.  You have pus coming from  your wound.  You have an oral temperature above 102 F (38.9 C).  You notice a bad smell coming from the wound or dressing.  You have severe pain with any motion of your knee. SEEK IMMEDIATE MEDICAL CARE IF:   You develop a rash.  You have difficulty breathing.  You have any allergic problems. Document Released: 01/14/2000 Document Revised: 04/10/2011 Document Reviewed: 08/07/2007 [facility] Patient Information 2014 [place]. PATIENT INSTRUCTIONS POST-ANESTHESIA  IMMEDIATELY  FOLLOWING SURGERY:  Do not drive or operate machinery for the first twenty four hours after surgery.  Do not make any important decisions for twenty four hours after surgery or while taking narcotic pain medications or sedatives.  If you develop intractable nausea and vomiting or a severe headache please notify your doctor immediately.  FOLLOW-UP:  Please make an appointment with your surgeon as instructed. You do not need to follow up with anesthesia unless specifically instructed to do so.  WOUND CARE INSTRUCTIONS (if applicable):  Keep a dry clean dressing on the anesthesia/puncture wound site if there is drainage.  Once the wound has quit draining you may leave it open to air.  Generally you should leave the bandage intact for twenty four hours unless there is drainage.  If the epidural site drains for more than 36-48 hours please call the anesthesia department.  QUESTIONS?:  Please feel free to call your physician or the hospital operator if you have any questions, and they will be happy to assist you.

## 2013-01-20 ENCOUNTER — Ambulatory Visit (HOSPITAL_COMMUNITY)
Admission: RE | Admit: 2013-01-20 | Discharge: 2013-01-20 | Disposition: A | Discharge status: Home / Self Care | Payer: BC Managed Care – PPO | Source: Ambulatory Visit | Attending: Orthopaedic Surgery | Admitting: Orthopaedic Surgery

## 2013-01-20 ENCOUNTER — Ambulatory Visit (HOSPITAL_COMMUNITY): Payer: BC Managed Care – PPO | Admitting: Anesthesiology

## 2013-01-20 ENCOUNTER — Encounter (HOSPITAL_COMMUNITY): Payer: Self-pay | Admitting: *Deleted

## 2013-01-20 ENCOUNTER — Encounter (HOSPITAL_COMMUNITY): Payer: BC Managed Care – PPO | Admitting: Anesthesiology

## 2013-01-20 ENCOUNTER — Encounter (HOSPITAL_COMMUNITY)
Admission: RE | Disposition: A | Discharge status: Home / Self Care | Payer: Self-pay | Source: Ambulatory Visit | Attending: Orthopaedic Surgery

## 2013-01-20 DIAGNOSIS — M659 Unspecified synovitis and tenosynovitis, unspecified site: Secondary | ICD-10-CM | POA: Insufficient documentation

## 2013-01-20 DIAGNOSIS — E785 Hyperlipidemia, unspecified: Secondary | ICD-10-CM | POA: Insufficient documentation

## 2013-01-20 DIAGNOSIS — IMO0002 Reserved for concepts with insufficient information to code with codable children: Secondary | ICD-10-CM | POA: Insufficient documentation

## 2013-01-20 DIAGNOSIS — I10 Essential (primary) hypertension: Secondary | ICD-10-CM | POA: Insufficient documentation

## 2013-01-20 HISTORY — PX: KNEE ARTHROSCOPY: SHX127

## 2013-01-20 HISTORY — PX: MENISECTOMY: SHX5181

## 2013-01-20 SURGERY — ARTHROSCOPY, KNEE
Anesthesia: General | Site: Knee | Laterality: Right

## 2013-01-20 MED ORDER — MIDAZOLAM HCL 2 MG/2ML IJ SOLN
1.0000 mg | INTRAMUSCULAR | Status: DC | PRN
  Administered 2013-01-20: 2 mg via INTRAVENOUS

## 2013-01-20 MED ORDER — LACTATED RINGERS IR SOLN
Status: DC | PRN
  Administered 2013-01-20 (×4): 3000 mL

## 2013-01-20 MED ORDER — CHLORHEXIDINE GLUCONATE 4 % EX LIQD: 60.0000 mL | Freq: Once | CUTANEOUS | Status: DC

## 2013-01-20 MED ORDER — LABETALOL HCL 5 MG/ML IV SOLN
INTRAVENOUS | Status: DC | PRN
  Administered 2013-01-20: 5 mg via INTRAVENOUS

## 2013-01-20 MED ORDER — SODIUM CHLORIDE 0.9 % IR SOLN
Status: DC | PRN
  Administered 2013-01-20: 250 mL

## 2013-01-20 MED ORDER — PROPOFOL 10 MG/ML IV BOLUS
INTRAVENOUS | Status: DC | PRN
  Administered 2013-01-20: 160 mg via INTRAVENOUS

## 2013-01-20 MED ORDER — ONDANSETRON HCL 4 MG/2ML IJ SOLN
INTRAMUSCULAR | Status: AC
  Filled 2013-01-20: qty 2

## 2013-01-20 MED ORDER — BUPIVACAINE HCL (PF) 0.25 % IJ SOLN
INTRAMUSCULAR | Status: DC | PRN
  Administered 2013-01-20: 30 mL

## 2013-01-20 MED ORDER — LABETALOL HCL 5 MG/ML IV SOLN
10.0000 mg | Freq: Once | INTRAVENOUS | Status: AC
  Administered 2013-01-20: 10 mg via INTRAVENOUS

## 2013-01-20 MED ORDER — LABETALOL HCL 5 MG/ML IV SOLN
5.0000 mg | Freq: Once | INTRAVENOUS | Status: AC
  Administered 2013-01-20: 5 mg via INTRAVENOUS

## 2013-01-20 MED ORDER — MIDAZOLAM HCL 2 MG/2ML IJ SOLN
INTRAMUSCULAR | Status: AC
  Filled 2013-01-20: qty 2

## 2013-01-20 MED ORDER — FENTANYL CITRATE 0.05 MG/ML IJ SOLN
INTRAMUSCULAR | Status: DC | PRN
  Administered 2013-01-20: 50 ug via INTRAVENOUS
  Administered 2013-01-20 (×2): 25 ug via INTRAVENOUS

## 2013-01-20 MED ORDER — FENTANYL CITRATE 0.05 MG/ML IJ SOLN
INTRAMUSCULAR | Status: AC
  Filled 2013-01-20: qty 2

## 2013-01-20 MED ORDER — FENTANYL CITRATE 0.05 MG/ML IJ SOLN
25.0000 ug | INTRAMUSCULAR | Status: DC | PRN
  Administered 2013-01-20: 50 ug via INTRAVENOUS

## 2013-01-20 MED ORDER — LACTATED RINGERS IV SOLN
INTRAVENOUS | Status: DC
  Administered 2013-01-20: 1000 mL via INTRAVENOUS

## 2013-01-20 MED ORDER — HYDRALAZINE HCL 20 MG/ML IJ SOLN
5.0000 mg | Freq: Once | INTRAMUSCULAR | Status: AC
  Administered 2013-01-20: 5 mg via INTRAVENOUS

## 2013-01-20 MED ORDER — PROPOFOL 10 MG/ML IV BOLUS
INTRAVENOUS | Status: AC
  Filled 2013-01-20: qty 20

## 2013-01-20 MED ORDER — LIDOCAINE HCL (PF) 1 % IJ SOLN
INTRAMUSCULAR | Status: AC
  Filled 2013-01-20: qty 5

## 2013-01-20 MED ORDER — LIDOCAINE HCL 1 % IJ SOLN
INTRAMUSCULAR | Status: DC | PRN
  Administered 2013-01-20: 50 mg via INTRADERMAL

## 2013-01-20 MED ORDER — MIDAZOLAM HCL 5 MG/5ML IJ SOLN
INTRAMUSCULAR | Status: DC | PRN
  Administered 2013-01-20: 2 mg via INTRAVENOUS

## 2013-01-20 MED ORDER — FENTANYL CITRATE 0.05 MG/ML IJ SOLN
25.0000 ug | INTRAMUSCULAR | Status: AC
  Administered 2013-01-20 (×2): 25 ug via INTRAVENOUS

## 2013-01-20 MED ORDER — BUPIVACAINE HCL (PF) 0.25 % IJ SOLN
INTRAMUSCULAR | Status: AC
  Filled 2013-01-20: qty 30

## 2013-01-20 MED ORDER — HYDRALAZINE HCL 20 MG/ML IJ SOLN
INTRAMUSCULAR | Status: AC
  Filled 2013-01-20: qty 1

## 2013-01-20 MED ORDER — ONDANSETRON HCL 4 MG/2ML IJ SOLN: 4.0000 mg | Freq: Once | INTRAMUSCULAR | Status: DC | PRN

## 2013-01-20 MED ORDER — LABETALOL HCL 5 MG/ML IV SOLN
INTRAVENOUS | Status: AC
  Filled 2013-01-20: qty 4

## 2013-01-20 SURGICAL SUPPLY — 54 items
BAG HAMPER (MISCELLANEOUS) ×2 IMPLANT
BANDAGE ELASTIC 6 VELCRO NS (GAUZE/BANDAGES/DRESSINGS) ×2 IMPLANT
BANDAGE ESMARK 4X12 BL STRL LF (DISPOSABLE) ×1 IMPLANT
BLADE SURG 15 STRL LF DISP TIS (BLADE) ×1 IMPLANT
BLADE SURG 15 STRL SS (BLADE) ×1
BLADE SURG SZ11 CARB STEEL (BLADE) ×2 IMPLANT
BNDG ESMARK 4X12 BLUE STRL LF (DISPOSABLE) ×2
CLOTH BEACON ORANGE TIMEOUT ST (SAFETY) ×2 IMPLANT
CUFF TOURNIQUET SINGLE 34IN LL (TOURNIQUET CUFF) ×2 IMPLANT
CUFF TOURNIQUET SINGLE 44IN (TOURNIQUET CUFF) IMPLANT
CUTTER TOMCAT 4.0M (BURR) ×2 IMPLANT
DECANTER SPIKE VIAL GLASS SM (MISCELLANEOUS) ×2 IMPLANT
DRAPE PROXIMA HALF (DRAPES) ×2 IMPLANT
DRSG XEROFORM 1X8 (GAUZE/BANDAGES/DRESSINGS) ×2 IMPLANT
DURAPREP 26ML APPLICATOR (WOUND CARE) ×2 IMPLANT
ELECT MENISCUS 165MM 90D (ELECTRODE) IMPLANT
FORMALIN 10 PREFIL 480ML (MISCELLANEOUS) ×2 IMPLANT
GAUZE SPONGE 4X4 16PLY XRAY LF (GAUZE/BANDAGES/DRESSINGS) ×2 IMPLANT
GLOVE BIO SURGEON STRL SZ8 (GLOVE) ×2 IMPLANT
GLOVE BIO SURGEON STRL SZ8.5 (GLOVE) ×2 IMPLANT
GLOVE EXAM NITRILE MD LF STRL (GLOVE) ×2 IMPLANT
GLOVE INDICATOR 7.0 STRL GRN (GLOVE) ×2 IMPLANT
GLOVE SS BIOGEL STRL SZ 6.5 (GLOVE) ×1 IMPLANT
GLOVE SUPERSENSE BIOGEL SZ 6.5 (GLOVE) ×1
GOWN STRL REIN XL XLG (GOWN DISPOSABLE) ×4 IMPLANT
HANDPIECE (MISCELLANEOUS) IMPLANT
HLDR LEG FOAM (MISCELLANEOUS) ×1 IMPLANT
IV LACTATED RINGER IRRG 3000ML (IV SOLUTION) ×4
IV LR IRRIG 3000ML ARTHROMATIC (IV SOLUTION) ×4 IMPLANT
KIT BLADEGUARD II DBL (SET/KITS/TRAYS/PACK) ×2 IMPLANT
KIT ROOM TURNOVER AP CYSTO (KITS) ×2 IMPLANT
KNIFE HOOK (BLADE) IMPLANT
KNIFE MENISECTOMY (BLADE) IMPLANT
LEG HOLDER FOAM (MISCELLANEOUS) ×1
MANIFOLD NEPTUNE II (INSTRUMENTS) ×2 IMPLANT
MARKER SKIN DUAL TIP RULER LAB (MISCELLANEOUS) ×2 IMPLANT
NEEDLE HYPO 21X1.5 SAFETY (NEEDLE) ×2 IMPLANT
NEEDLE SPNL 18GX3.5 QUINCKE PK (NEEDLE) ×2 IMPLANT
NS IRRIG 1000ML POUR BTL (IV SOLUTION) ×2 IMPLANT
PACK ARTHRO LIMB DRAPE STRL (MISCELLANEOUS) ×2 IMPLANT
PAD ABD 5X9 TENDERSORB (GAUZE/BANDAGES/DRESSINGS) ×2 IMPLANT
PAD ARMBOARD 7.5X6 YLW CONV (MISCELLANEOUS) ×2 IMPLANT
PADDING CAST COTTON 6X4 STRL (CAST SUPPLIES) ×2 IMPLANT
PADDING WEBRIL 6 STERILE (GAUZE/BANDAGES/DRESSINGS) ×2 IMPLANT
SET ARTHROSCOPY INST (INSTRUMENTS) ×2 IMPLANT
SET BASIN LINEN APH (SET/KITS/TRAYS/PACK) ×2 IMPLANT
SPONGE GAUZE 4X4 12PLY (GAUZE/BANDAGES/DRESSINGS) ×2 IMPLANT
SUT ETHILON 3 0 FSL (SUTURE) ×2 IMPLANT
SYR 30ML LL (SYRINGE) ×2 IMPLANT
TIP 0 DEGREE (MISCELLANEOUS) IMPLANT
TIP 30 DEGREE (MISCELLANEOUS) IMPLANT
TIP 70 DEGREE (MISCELLANEOUS) IMPLANT
TUBING FLOSTEADY ARTHRO PUMP (IRRIGATION / IRRIGATOR) ×2 IMPLANT
YANKAUER SUCT BULB TIP 10FT TU (MISCELLANEOUS) ×4 IMPLANT

## 2013-01-20 NOTE — Progress Notes (Signed)
The History and Physical is unchanged. I have examined the patient. The patient is medically able to have surgery on the right knee . Brent Nguyen 

## 2013-01-20 NOTE — Anesthesia Preprocedure Evaluation (Signed)
Anesthesia Evaluation  Patient identified by MRN, date of birth, ID band Patient awake    Reviewed: Allergy & Precautions, H&P , NPO status , Patient's Chart, lab work & pertinent test results, reviewed documented beta blocker date and time   Airway Mallampati: II TM Distance: >3 FB Neck ROM: Full    Dental  (+) Poor Dentition, Missing, Chipped and Dental Advisory Given   Pulmonary Current Smoker,  breath sounds clear to auscultation        Cardiovascular hypertension, Pt. on medications and Pt. on home beta blockers Rhythm:Regular Rate:Normal     Neuro/Psych    GI/Hepatic   Endo/Other    Renal/GU      Musculoskeletal   Abdominal   Peds  Hematology   Anesthesia Other Findings   Reproductive/Obstetrics                           Anesthesia Physical Anesthesia Plan  ASA: II  Anesthesia Plan: General   Post-op Pain Management:    Induction: Intravenous  Airway Management Planned: LMA  Additional Equipment:   Intra-op Plan:   Post-operative Plan: Extubation in OR  Informed Consent: I have reviewed the patients History and Physical, chart, labs and discussed the procedure including the risks, benefits and alternatives for the proposed anesthesia with the patient or authorized representative who has indicated his/her understanding and acceptance.     Plan Discussed with:   Anesthesia Plan Comments:         Anesthesia Quick Evaluation

## 2013-01-20 NOTE — Transfer of Care (Signed)
Immediate Anesthesia Transfer of Care Note  Patient: Brent Nguyen  Procedure(s) Performed: Procedure(s): ARTHROSCOPY KNEE (Right) partial medial menisectomy (Right)  Patient Location: PACU  Anesthesia Type:General  Level of Consciousness: awake and patient cooperative  Airway & Oxygen Therapy: Patient Spontanous Breathing and Patient connected to face mask oxygen  Post-op Assessment: Report given to PACU RN, Post -op Vital signs reviewed and stable and Patient moving all extremities  Post vital signs: Reviewed and stable  Complications: No apparent anesthesia complications

## 2013-01-20 NOTE — Anesthesia Postprocedure Evaluation (Signed)
  Anesthesia Post-op Note  Patient: Brent Nguyen  Procedure(s) Performed: Procedure(s): ARTHROSCOPY KNEE (Right) partial medial menisectomy (Right)  Patient Location: PACU  Anesthesia Type:General  Level of Consciousness: awake, alert , oriented and patient cooperative  Airway and Oxygen Therapy: Patient Spontanous Breathing  Post-op Pain: 3 /10, mild  Post-op Assessment: Post-op Vital signs reviewed, Patient's Cardiovascular Status Stable, Respiratory Function Stable, Patent Airway, No signs of Nausea or vomiting and Pain level controlled  Post-op Vital Signs: Reviewed and stable  Complications: No apparent anesthesia complications

## 2013-01-20 NOTE — Op Note (Signed)
NAME:  Brent Nguyen, Brent Nguyen                ACCOUNT NO.:  [id number]  MEDICAL RECORD NO.:  [id number]  LOCATION:  [facility]                          FACILITY:  [facility]  PHYSICIAN:  J. Darreld Mclean, M.D. DATE OF BIRTH:  1965/08/05  DATE OF PROCEDURE:  01/20/2013 DATE OF DISCHARGE:                              OPERATIVE REPORT   PREOPERATIVE DIAGNOSIS:  Tear of right knee medial meniscus.  POSTOPERATIVE DIAGNOSIS:  Tear of right knee medial meniscus.  PROCEDURE:  Operative arthroscopy of the right knee partial medial meniscectomy.  ANESTHESIA:  General.  TOURNIQUET TIME:  21 minute.  DRAINS:  None.  SURGEON:  J. Darreld Mclean, M.D.  INDICATIONS:  The patient is a 47 year old male with spine pain and tenderness in his right knee several months progressively getting worse with giving way.  MRI shows tear of the posterior horn of the medial meniscus.  Surgery was recommended.  The patient put it off to do some personal business and elected to have the surgery at this time.  I have gone over the risks and imponderables at the procedure and he appeared to understand and agreed to procedure as outlined.  DESCRIPTION OF PROCEDURE:  The patient was seen in the holding area, and the right knee was identified as a correct surgical site.  He placed a mark on the right knee.  I placed a mark on the right knee.  He was brought to the operating room, was given general anesthesia while supine.  Tourniquet and leg holder placed deflated in right upper thigh. He was prepped and draped in usual manner.  We had a generalized time- out identifying the patient as [name].  We are doing his right knee for arthroscopy with a medial side up.  All instrumentation were properly positioned and working.  Everyone in the OR knew each other.  Leg was elevated and wrapped circumferentially with an Esmarch bandage after the prep and drape.  Tourniquet inflated to 300 mmHg.  Esmarch bandage removed.  Inflow cannula  inserted medially and lactated Ringer's instilled into the knee by an infusion pump.  Arthroscope inserted laterally.  The knee was systematically examined.  Apparent pictures were taken.  Suprapatellar pouch had mild synovitis.  Undersurface patella had grade 2 changes.  Medially, there was grade 2-3 changes much more know what I thought, particularly the femoral condyle.  A tear in the posterior horn of the medial meniscus.  No loose fragments or loose bodies.  Anterior cruciate was intact.  Laterally, looked very good with no tear of the meniscus.  Meniscus looked good.  No loose bodies.  Attention directed to medial side using meniscal shaver, meniscal punch. The tear was removed and the meniscus remaining was smoothed with the shaver.  Good smooth contour was obtained.  Specimens sent to pathology. Knee was systematically reexamined and no pathology found.  No loose fragments.  The wound was reapproximated using 3-0 nylon interrupted vertical mattress manner.  Marcaine 0.25% instilled in each portal. Tourniquet deflated after 21 minutes.  Sterile dressing applied.  Bulky dressing applied.  Given Norco 7.5 for pain.  I will see him in the office in approximately 10 days to 2 weeks.  Physical therapy has been arranged.  If any difficulties, contact me through the office or the hospital beeper system.          ______________________________ Shela Commons. Darreld Mclean, M.D.     JWK/MEDQ  D:  01/20/2013  T:  01/20/2013  Job:  [id number]

## 2013-01-20 NOTE — Anesthesia Procedure Notes (Signed)
Procedure Name: LMA Insertion Date/Time: 01/20/2013 7:43 AM Performed by: Despina Hidden Pre-anesthesia Checklist: Patient being monitored, Suction available, Emergency Drugs available and Patient identified Patient Re-evaluated:Patient Re-evaluated prior to inductionOxygen Delivery Method: Circle system utilized Preoxygenation: Pre-oxygenation with 100% oxygen Intubation Type: IV induction Ventilation: Mask ventilation without difficulty LMA: LMA inserted LMA Size: 4.0 Tube type: Oral Number of attempts: 1 Placement Confirmation: positive ETCO2 and breath sounds checked- equal and bilateral Tube secured with: Tape Dental Injury: Teeth and Oropharynx as per pre-operative assessment

## 2013-01-20 NOTE — Brief Op Note (Signed)
01/20/2013  8:21 AM  PATIENT:  Brent Nguyen  47 y.o. male  PRE-OPERATIVE DIAGNOSIS:  tear medial meniscus right knee  POST-OPERATIVE DIAGNOSIS:  tear medial meniscus right knee  PROCEDURE:  Procedure(s): ARTHROSCOPY KNEE (Right) partial medial menisectomy (Right)  SURGEON:  Surgeon(s) and Role:    * Darreld Mclean, MD - Primary  PHYSICIAN ASSISTANT:   ASSISTANTS: none   ANESTHESIA:   general  EBL:  Total I/O In: 500 [I.V.:500] Out: -   BLOOD ADMINISTERED:none  DRAINS: none   LOCAL MEDICATIONS USED:  MARCAINE     SPECIMEN:  Source of Specimen:  right knee meniscus shavings  DISPOSITION OF SPECIMEN:  PATHOLOGY  COUNTS:  YES  TOURNIQUET: 21 minutes  DICTATION: .Other Dictation: Dictation Number [id number]  PLAN OF CARE: Discharge to home after PACU  PATIENT DISPOSITION:  PACU - hemodynamically stable.   Delay start of Pharmacological VTE agent (>24hrs) due to surgical blood loss or risk of bleeding: not applicable

## 2013-01-21 ENCOUNTER — Encounter (HOSPITAL_COMMUNITY): Payer: Self-pay | Admitting: Orthopaedic Surgery

## 2013-01-30 DIAGNOSIS — IMO0001 Reserved for inherently not codable concepts without codable children: Secondary | ICD-10-CM

## 2013-01-30 HISTORY — DX: Reserved for inherently not codable concepts without codable children: IMO0001

## 2013-03-13 ENCOUNTER — Encounter: Payer: Self-pay | Admitting: Cardiovascular Disease

## 2013-03-13 ENCOUNTER — Ambulatory Visit (INDEPENDENT_AMBULATORY_CARE_PROVIDER_SITE_OTHER): Payer: BC Managed Care – PPO | Admitting: Cardiovascular Disease

## 2013-03-13 VITALS — BP 138/110 | HR 71 | Resp 16 | Ht 73.0 in | Wt 241.6 lb

## 2013-03-13 DIAGNOSIS — I10 Essential (primary) hypertension: Secondary | ICD-10-CM

## 2013-03-13 MED ORDER — CARVEDILOL 12.5 MG PO TABS: 12.5000 mg | ORAL_TABLET | Freq: Two times a day (BID) | ORAL | Status: DC

## 2013-03-13 MED ORDER — AMLODIPINE BESYLATE 5 MG PO TABS
5.0000 mg | ORAL_TABLET | Freq: Every day | ORAL | Status: DC
Start: 2013-03-13 — End: 2013-04-29

## 2013-03-13 NOTE — Progress Notes (Signed)
Patient ID: Brent Nguyen, male   DOB: 1965-05-30, 48 y.o.   MRN: [id number]      Reason for office visit HTN  Tyrann is having increasing problems with high blood pressure. His systolic blood pressure has been consistently around 160 and his diastolic blood pressures often as high as 110. One day he felt poorly after an argument and his blood pressure was 160/124 mm Hg. He has not been exercising although he Smyers tries to avoid salt rich foods. He has a fairly physical job and does not feel limited by shortness of breath, dizziness, headaches or chest discomfort.   Allergies  Allergen Reactions  . Penicillins Other (See Comments)    Allergy Test Confirmed     Current Outpatient Prescriptions  Medication Sig Dispense Refill  . HYDROcodone-acetaminophen (LORTAB) 10-500 MG per tablet Take 1 tablet by mouth every 4 (four) hours as needed for pain.       Marland Kitchen amLODipine (NORVASC) 5 MG tablet Take 1 tablet (5 mg total) by mouth daily.  30 tablet  6  . carvedilol (COREG) 12.5 MG tablet Take 1 tablet (12.5 mg total) by mouth 2 (two) times daily.  60 tablet  6   No current facility-administered medications for this visit.    Past Medical History  Diagnosis Date  . Stomach problems   . Hyperlipidemia   . Hypertension     Past Surgical History  Procedure Laterality Date  . 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 Colostomy reversal    . Knee arthroscopy Right   . Wisdom tooth extraction    . Knee arthroscopy Right 01/20/2013    Procedure: ARTHROSCOPY KNEE;  Surgeon: Darreld Mclean, MD;  Location: [facility];  Service: Orthopedics;  Laterality: Right;  . Menisectomy Right 01/20/2013    Procedure: partial medial menisectomy;  Surgeon: Darreld Mclean, MD;  Location: [facility];  Service: Orthopedics;  Laterality: Right;    Family History  Problem Relation Age of Onset  . Hypertension Mother   . Diverticulosis Mother   . Sleep apnea Mother   .  Celiac disease Mother   . Ulcers Mother   . Cancer Father     Brain  . Diverticulosis Brother   . Ulcers Brother   . Celiac disease Brother   . Hypotension Brother   . Cancer Maternal Grandmother     Breast  . Ulcers Maternal Grandfather   . ALS Maternal Grandfather   . Diabetes Maternal Grandfather   . Heart attack Paternal Grandmother   . Hypertension Paternal Grandmother   . Cancer Paternal Grandfather     Melanoma  . Diverticulosis Daughter     History   Social History  . Marital Status: Married    Spouse Name: N/A    Number of Children: N/A  . Years of Education: N/A   Occupational History  . Not on file.   Social History Main Topics  . Smoking status: Current Every Day Smoker -- 1.00 packs/day for 25 years    Types: Cigarettes  . Smokeless tobacco: Former Neurosurgeon  . Alcohol Use: Yes     Comment: On occasion, 1 or 2 beers per month  . Drug Use: No  . Sexual Activity: Yes    Birth Control/ Protection: None   Other Topics Concern  . Not on file   Social History Narrative  . No narrative on file    Review of systems: The patient specifically denies any  chest pain at rest or with exertion, dyspnea at rest or with exertion, orthopnea, paroxysmal nocturnal dyspnea, syncope, palpitations, focal neurological deficits, intermittent claudication, lower extremity edema, unexplained weight gain, cough, hemoptysis or wheezing.  The patient also denies abdominal pain, nausea, vomiting, dysphagia, diarrhea, constipation, polyuria, polydipsia, dysuria, hematuria, frequency, urgency, abnormal bleeding or bruising, fever, chills, unexpected weight changes, mood swings, change in skin or hair texture, change in voice quality, auditory or visual problems, allergic reactions or rashes, new musculoskeletal complaints other than usual "aches and pains".   PHYSICAL EXAM BP 138/110  Pulse 71  Resp 16  Ht 6\' 1"  (1.854 m)  Wt 109.589 kg (241 lb 9.6 oz)  BMI 31.88 kg/m2  General:  Alert, oriented x3, no distress Head: no evidence of trauma, PERRL, EOMI, no exophtalmos or lid lag, no myxedema, no xanthelasma; normal ears, nose and oropharynx Neck: normal jugular venous pulsations and no hepatojugular reflux; brisk carotid pulses without delay and no carotid bruits Chest: clear to auscultation, no signs of consolidation by percussion or palpation, normal fremitus, symmetrical and full respiratory excursions Cardiovascular: normal position and quality of the apical impulse, regular rhythm, normal first and second heart sounds, no murmurs, rubs or gallops Abdomen: no tenderness or distention, no masses by palpation, no abnormal pulsatility or arterial bruits, normal bowel sounds, no hepatosplenomegaly Extremities: no clubbing, cyanosis or edema; 2+ radial, ulnar and brachial pulses bilaterally; 2+ right femoral, posterior tibial and dorsalis pedis pulses; 2+ left femoral, posterior tibial and dorsalis pedis pulses; no subclavian or femoral bruits Neurological: grossly nonfocal   EKG: NSR  BMET    Component Value Date/Time   NA 138 01/17/2013 1155   K 4.0 01/17/2013 1155   CL 100 01/17/2013 1155   CO2 26 01/17/2013 1155   GLUCOSE 93 01/17/2013 1155   BUN 12 01/17/2013 1155   CREATININE 0.82 01/17/2013 1155   CALCIUM 9.8 01/17/2013 1155   GFRNONAA >90 01/17/2013 1155   GFRAA >90 01/17/2013 1155     ASSESSMENT AND PLAN I recommended that he increase the carvedilol to 12.5 mg twice a day since his bouts of hypertension appeared to be at least in part adrenergically driven. I don't pick this will be enough to control his high blood pressure and have advised that he start taking amlodipine 5 mg daily one week later. He'll followup in a couple of weeks with our clinical pharmacist, K. Alvstad further titration of his medications and I will see him back in about a month. I congratulated him on compliance with a sodium restricted diet and encourage him to become more  physically active and lose weight. Smoking cessation remains an important long-term goal. Orders Placed This Encounter  Procedures  . EKG 12-Lead   Meds ordered this encounter  Medications  . carvedilol (COREG) 12.5 MG tablet    Sig: Take 1 tablet (12.5 mg total) by mouth 2 (two) times daily.    Dispense:  60 tablet    Refill:  6  . amLODipine (NORVASC) 5 MG tablet    Sig: Take 1 tablet (5 mg total) by mouth daily.    Dispense:  30 tablet    Refill:  6    Rosanna Bickle  Thurmon FairMihai Jazon Jipson, MD, [facility] MemphisFACC [facility] [phone] office [phone] pager

## 2013-03-13 NOTE — Patient Instructions (Signed)
INCREASE Carvedilol to 12.5mg  twice a day.  A new prescription has been sent to your pharmacy.  In one week add Amlodipine 5mg  once a day. This prescription has been sent to your pharmacy.  Your physician recommends that you schedule a follow-up appointment in: with our pharmacist Belenda CruiseKristin in 2 weeks.  Your physician recommends that you schedule a follow-up appointment in: one month with Dr. Royann Shiversroitoru.

## 2013-03-27 ENCOUNTER — Ambulatory Visit: Payer: BC Managed Care – PPO | Admitting: Pharmacist Clinician (PhC)/ Clinical Pharmacy Specialist

## 2013-04-02 ENCOUNTER — Encounter: Payer: Self-pay | Admitting: Pharmacist Clinician (PhC)/ Clinical Pharmacy Specialist

## 2013-04-02 ENCOUNTER — Ambulatory Visit (INDEPENDENT_AMBULATORY_CARE_PROVIDER_SITE_OTHER): Payer: BC Managed Care – PPO | Admitting: Pharmacist Clinician (PhC)/ Clinical Pharmacy Specialist

## 2013-04-02 VITALS — BP 124/88 | HR 84 | Ht 73.0 in | Wt 241.9 lb

## 2013-04-02 DIAGNOSIS — I10 Essential (primary) hypertension: Secondary | ICD-10-CM

## 2013-04-02 NOTE — Progress Notes (Signed)
04/02/2013 Doris CheadleRobert L Bath March 19, 1965 [id number]   HPI:  Doris CheadleRobert L Mangal is a 48 y.o. male patient of Dr Royann Shiversroitoru, with a PMH below who presents today for a blood pressure check.  He saw Dr. Royann Shiversroitoru on 2/12 and at that time was 138/110.  Looking at past recorded readings, he appears to have more trouble with diastolic than systolic readings.  He does admit that when he gets angry or frustrated at work his pressure goes up.  Recently had a situation at work that agitated him and he noticed that his lips started tingling.  He was checked by the RN at his company and only remembers that the diastolic reading was 124.  He also notes on another occasion he woke up to l his heart so much his upper torso shook.  He stated that episode lasted 1+ minutes and resolved with deep breathing.  He smokes between 1/2-2 packs per day, depending on his stress levels, drinks 40+ oz of diet [facility] each day, but does not drink much alcohol.  He tries to keep his salt intake low and does not do any regular exercise.  He works second shift at a beer plant.  Other history notable for arthroscopic knee surgery in late December, and he states that this Vanroekel bothers him.  He mostly takes acetaminophen to help with the pain, which he describes as arthritis-like, but only takes 1-2 tablets maximum per day, if any.  When he saw Dr. Royann Shiversroitoru his carvedilol was increased to 12.5mg  bid and amlodipine 5mg  once daily was added on.  He has no complaints or side effects from either medication and states compliance is not a problem, as the family dog has a twice daily med as well, they take them at the same time.     Current Outpatient Prescriptions  Medication Sig Dispense Refill  . amLODipine (NORVASC) 5 MG tablet Take 1 tablet (5 mg total) by mouth daily.  30 tablet  6  . carvedilol (COREG) 12.5 MG tablet Take 1 tablet (12.5 mg total) by mouth 2 (two) times daily.  60 tablet  6  . HYDROcodone-acetaminophen (LORTAB) 10-500 MG  per tablet Take 1 tablet by mouth every 4 (four) hours as needed for pain.        No current facility-administered medications for this visit.    Allergies  Allergen Reactions  . Penicillins Other (See Comments)    Allergy Test Confirmed     Past Medical History  Diagnosis Date  . Stomach problems   . Hyperlipidemia   . Hypertension     Blood pressure 124/88, pulse 84, height 6\' 1"  (1.854 m), weight 241 lb 14.4 oz (109.725 kg).   ASSESSMENT AND PLAN:  Today Mr. Romero LinerStill's blood pressure is within the JNC guideline of <140/90.  While the amlodipine and carvedilol do seem to make a difference, I suspect that with any agitation/anger, his pressure will continue to spike.  He does not have a home BP cuff, but they have a health office at work, so I have asked him to check his BP daily at work and be sure to sit calmly for several minutes before having it checked.  Also, if he feels any more dizziness, palpitations or tingling of his face he should get a reading at that time.  He is scheduled to see Dr. Royann Shiversroitoru next Thursday and I have asked him to keep that appointment and bring in a record of his BP from work.  I  will see him afterward if there is any need.  Phillips Hay PharmD CPP [facility]

## 2013-04-02 NOTE — Patient Instructions (Signed)
Return for a a follow up appointment with Dr. Salena Saner next Thursday  Your blood pressure today is 124/88  Check your blood pressure at home daily (if able) and keep record of the readings.  Take your BP meds as follows: carvedilol twice daily and amlodipine once daily  Bring all of your meds, your BP cuff and your record of home blood pressures to your next appointment.  Exercise as you're able, try to walk approximately 30 minutes per day.  Keep salt intake to a minimum, especially watch canned and prepared boxed foods.  Eat more fresh fruits and vegetables and fewer canned items.  Avoid eating in fast food restaurants.    HOW TO TAKE YOUR BLOOD PRESSURE:   Rest 5 minutes before taking your blood pressure.    Don't smoke or drink caffeinated beverages for at least 30 minutes before.   Take your blood pressure before (not after) you eat.   Sit comfortably with your back supported and both feet on the floor (don't cross your legs).   Elevate your arm to heart level on a table or a desk.   Use the proper sized cuff. It should fit smoothly and snugly around your bare upper arm. There should be enough room to slip a fingertip under the cuff. The bottom edge of the cuff should be 1 inch above the crease of the elbow.   Ideally, take 3 measurements at one sitting and record the average.

## 2013-04-10 ENCOUNTER — Ambulatory Visit: Payer: BC Managed Care – PPO | Admitting: Cardiovascular Disease

## 2013-04-24 ENCOUNTER — Encounter: Payer: Self-pay | Admitting: Physician Assistant

## 2013-04-25 ENCOUNTER — Other Ambulatory Visit: Payer: Self-pay | Admitting: *Deleted

## 2013-04-25 ENCOUNTER — Ambulatory Visit (INDEPENDENT_AMBULATORY_CARE_PROVIDER_SITE_OTHER): Payer: BC Managed Care – PPO | Admitting: Physician Assistant

## 2013-04-25 ENCOUNTER — Encounter: Payer: Self-pay | Admitting: Physician Assistant

## 2013-04-25 VITALS — BP 130/80 | HR 76 | Ht 73.0 in | Wt 239.0 lb

## 2013-04-25 DIAGNOSIS — E785 Hyperlipidemia, unspecified: Secondary | ICD-10-CM | POA: Insufficient documentation

## 2013-04-25 DIAGNOSIS — R002 Palpitations: Secondary | ICD-10-CM

## 2013-04-25 DIAGNOSIS — K5790 Diverticulosis of intestine, part unspecified, without perforation or abscess without bleeding: Secondary | ICD-10-CM | POA: Insufficient documentation

## 2013-04-25 DIAGNOSIS — Z720 Tobacco use: Secondary | ICD-10-CM

## 2013-04-25 DIAGNOSIS — I10 Essential (primary) hypertension: Secondary | ICD-10-CM

## 2013-04-25 DIAGNOSIS — F17200 Nicotine dependence, unspecified, uncomplicated: Secondary | ICD-10-CM

## 2013-04-25 DIAGNOSIS — R079 Chest pain, unspecified: Secondary | ICD-10-CM

## 2013-04-25 DIAGNOSIS — R42 Dizziness and giddiness: Secondary | ICD-10-CM

## 2013-04-25 MED ORDER — ASPIRIN EC 81 MG PO TBEC: 81.0000 mg | DELAYED_RELEASE_TABLET | Freq: Every day | ORAL | Status: DC

## 2013-04-25 NOTE — Patient Instructions (Signed)
Your physician recommends that you schedule a follow-up appointment in: 2 weeks with an extender on a day that Dr. Royann Shiversroitoru is in the office.  Start taking Aspirin 81 mg Daily  Wear the Cardiac event monitor for 21 days  We will schedule a Exercise Stress test as soon as possible

## 2013-04-25 NOTE — Progress Notes (Signed)
[address] [place], [place]  [address] Phone: [phone] Fax:  [phone]  Date:  04/25/2013   Patient ID:  Brent Nguyen, DOB 12-27-1965, MRN [id number]   PCP:  Alice Reichert, MD  Cardiologist:  Croitoru  History of Present Illness: Brent Nguyen is a 48 y.o. male followed by Dr. Royann Shivers for management of hypertension. He also has a history of tobacco abuse, obesity (working on weight loss), diverticulosis s/p surgery, and hyperlipidemia followed by PCP. He presents to clinic today for followup of blood pressure. At last visit with Dr. Royann Shivers on 03/13/13, Coreg was titrated to 12.5mg  BID with instructions to start amlodipine 5mg  a week later. He saw our pharmacist Belenda Cruise for a followup BP check 04/02/13 at which time BP was 124/88.   He presents back to clinic with overall well controlled BP - generally running 120-130s/80s. Today it is 130/80. He has been doing well except reports an episode of chest pain and episode of dizziness. On Saturday and Sunday (3/21-3/22), he had the days off and spent all day at a dusty [employer] track. When he came home he was covered in red dirt. On Sunday evening lasting constantly through Monday afternoon he had a constant low grade chest tightness like congestion. It was associated with mild dyspnea and slightly worse when he walked very fast. He felt like he was coming down with something. This resolved and he has not had this since. On Wednesday afternoon (3/25) he ate a meal and began to have an episode of lightheadedness and slightly blurred vision. He had tingling sensation in his tongue similar to when he's gotten in arguments with people at work in the past. This resolved after 15 minutes.  It happened again after he took a shower. He went onto work and about 2 hours into it had another episode. He had a coworker check his BP and it was 158/113, recheck several minutes later was 103/87. No associated palpitations with these episodes, facial  droop, slurred speech, numbness/weakness on one side of the body, focal visual changes, presyncope, syncope. That day he also felt sneezing, nasal congestion and sinus pressure. He was also suffering from lack of sleep due to working long 12-13hr shifts over the previous days. All of these symptoms have resolved. He has been back to work the last 2 days without any events - he works an active job and says he walks a tremendous amount during his shifts without any recurrent symptoms. He does state that 2-3x a week he has episodes of tachypalpitations, not associated with any other symptoms. This happens most frequently when he wakes up from a nap. He works 3rd shift so sleep schedule is variable. No LEE, orthopnea, PND, BRBPR, melena, hematemesis. He had bleeding issues before diverticular surgery but not since.  Recent Labs: 01/17/2013: ALT 24; Creatinine 0.82; Hemoglobin 15.9; Potassium 4.0   Wt Readings from Last 3 Encounters:  04/25/13 239 lb (108.41 kg)  04/02/13 241 lb 14.4 oz (109.725 kg)  03/13/13 241 lb 9.6 oz (109.589 kg)     Past Medical History  Diagnosis Date  . Diverticulosis     a. s/p partial colectomy 2005 for anastamotic leak with reversal.  . Hyperlipidemia   . Hypertension   . Tobacco abuse   . Obesity     Current Outpatient Prescriptions  Medication Sig Dispense Refill  . amLODipine (NORVASC) 5 MG tablet Take 1 tablet (5 mg total) by mouth daily.  30 tablet  6  . carvedilol (COREG) 12.5 MG tablet Take 1 tablet (12.5 mg total) by mouth 2 (two) times daily.  60 tablet  6   No current facility-administered medications for this visit.    Allergies:   Penicillins   Social History:  The patient  reports that he has been smoking Cigarettes.  He has a 25 pack-year smoking history. He has quit using smokeless tobacco. He reports that he drinks alcohol. He reports that he does not use illicit drugs.   Family History:  The patient's family history includes ALS in his  maternal grandfather; Cancer in his father, maternal grandmother, and paternal grandfather; Celiac disease in his brother and mother; Diabetes in his maternal grandfather; Diverticulosis in his brother, daughter, and mother; Heart attack in his paternal grandmother; Hypertension in his mother and paternal grandmother; Hypotension in his brother; Sleep apnea in his mother; Ulcers in his brother, maternal grandfather, and mother.   ROS:  Please see the history of present illness.   All other systems reviewed and negative.   PHYSICAL EXAM:  VS:  BP 130/80  Pulse 76  Ht 6\' 1"  (1.854 m)  Wt 239 lb (108.41 kg)  BMI 31.54 kg/m2 Well nourished, well developed WM, in no acute distress HEENT: normal Neck: no JVD, no carotid bruit Cardiac:  normal S1, S2; RRR; no murmur Lungs:  clear to auscultation bilaterally, no wheezing, rhonchi or rales Abd: soft, nontender, no hepatomegaly Ext: no edema Skin: warm and dry Neuro:  moves all extremities spontaneously, strength 5/5 equal bilaterally, no facial droop, speech is clear, PERRLA, no focal abnormalities noted  EKG:  NSR 76bpm no acute ST-T changes, unchanged from prior  ASSESSMENT AND PLAN:  1. Hypertension - well controlled for the most part. Continue current regimen. 2. Chest pain - typical and atypical features. Start aspirin 81mg  daily. EKG is unremarkable. D/w Dr. Rennis GoldenHilty. Will plan exercise nuclear stress test to further evaluate given cardiac risk factors of hypertension, hyperlipidemia, and tobacco abuse. Will see if we can obtain recent lipid panel from PCP - pt states this was done a month ago. If EF abnormal will need echocardiogram. With h/o diverticular disease he will watch for any bleeding on aspirin but suspect he will tolerate this fine. 3. Palpitations - he reports intermittent tachypalpitations (have been up to 2-3x/week at times). NSR in office. Will plan 21 day event monitor, also may help exclude arrythmia in setting of  #4. 4. Dizziness - no focal neurologic abnormalities. Possibly related to blood pressure fluctuations or anxiety. This episode was similar to when he got into an argument with someone at work in the past when his blood pressure spiked. As above, starting aspirin. Nuc as above, will also help us assess EF. Cardiac physical exam was normal. If this recurs will need to consider further neuro workup including carotid duplex. Warning signs reviewed with patient. 5. Hyperlipidemia - obtain lipid panel from PCP as above. 6. Tobacco abuse - we spent a lot of time discussing this. I told him I am worried about seeing him back as a bypass/PVD patient with a markedly diminished life expectancy. He acknowledges the need to quit but does not seem convincingly ready to do so.  Dispo: f/u 2 weeks with Dr. Royann Shiversroitoru or APP on a day when Dr. Royann Shiversroitoru is in the office  Signed, Ronie SpiesDayna Dunn, PA-C  04/25/2013 4:25 PM

## 2013-04-28 ENCOUNTER — Ambulatory Visit: Payer: BC Managed Care – PPO | Admitting: Cardiovascular Disease

## 2013-04-29 ENCOUNTER — Other Ambulatory Visit: Payer: Self-pay | Admitting: *Deleted

## 2013-04-29 MED ORDER — AMLODIPINE BESYLATE 5 MG PO TABS: 5.0000 mg | ORAL_TABLET | Freq: Every day | ORAL | Status: DC

## 2013-04-29 NOTE — Telephone Encounter (Signed)
pts 30 day supply was changed to 90 day supply and sent to pharmacy

## 2013-04-30 ENCOUNTER — Other Ambulatory Visit: Payer: Self-pay

## 2013-04-30 MED ORDER — CARVEDILOL 12.5 MG PO TABS: 12.5000 mg | ORAL_TABLET | Freq: Two times a day (BID) | ORAL | Status: DC

## 2013-04-30 NOTE — Telephone Encounter (Signed)
Rx was sent to pharmacy electronically. 

## 2013-05-01 ENCOUNTER — Telehealth (HOSPITAL_COMMUNITY): Payer: Self-pay

## 2013-05-06 ENCOUNTER — Ambulatory Visit (HOSPITAL_COMMUNITY)
Admission: RE | Admit: 2013-05-06 | Discharge: 2013-05-06 | Disposition: A | Discharge status: Home / Self Care | Payer: BC Managed Care – PPO | Source: Ambulatory Visit | Attending: Internal Medicine | Admitting: Internal Medicine

## 2013-05-06 DIAGNOSIS — E669 Obesity, unspecified: Secondary | ICD-10-CM | POA: Insufficient documentation

## 2013-05-06 DIAGNOSIS — R079 Chest pain, unspecified: Secondary | ICD-10-CM | POA: Insufficient documentation

## 2013-05-06 DIAGNOSIS — R0989 Other specified symptoms and signs involving the circulatory and respiratory systems: Secondary | ICD-10-CM | POA: Insufficient documentation

## 2013-05-06 DIAGNOSIS — R002 Palpitations: Secondary | ICD-10-CM | POA: Insufficient documentation

## 2013-05-06 DIAGNOSIS — I10 Essential (primary) hypertension: Secondary | ICD-10-CM | POA: Insufficient documentation

## 2013-05-06 DIAGNOSIS — R0609 Other forms of dyspnea: Secondary | ICD-10-CM | POA: Insufficient documentation

## 2013-05-06 DIAGNOSIS — F17200 Nicotine dependence, unspecified, uncomplicated: Secondary | ICD-10-CM | POA: Insufficient documentation

## 2013-05-06 DIAGNOSIS — R42 Dizziness and giddiness: Secondary | ICD-10-CM | POA: Insufficient documentation

## 2013-05-06 MED ORDER — TECHNETIUM TC 99M SESTAMIBI GENERIC - CARDIOLITE
29.4000 | Freq: Once | INTRAVENOUS | Status: AC | PRN
Start: 2013-05-06 — End: 2013-05-06
  Administered 2013-05-06: 29 via INTRAVENOUS

## 2013-05-06 MED ORDER — TECHNETIUM TC 99M SESTAMIBI GENERIC - CARDIOLITE
10.5000 | Freq: Once | INTRAVENOUS | Status: AC | PRN
  Administered 2013-05-06: 11 via INTRAVENOUS

## 2013-05-06 NOTE — Procedures (Addendum)
[facility] [address] [place] [address] [phone]  Cardiology Nuclear Med Cristi LoronStudy  Brent Nguyen is a 48 y.o. male     MRN : [id number]     DOB: 09/05/1965  Procedure Date: 05/06/2013  Nuclear Med Background Indication for Stress Test:  Evaluation for Ischemia History:  No prior cardiac or respiratory history reported. No prior NUC study for comparrison. Cardiac Risk Factors: History of Smoking, Hypertension, Lipids, Obesity and Smoker  Symptoms:  Chest Pain, Dizziness, DOE, Light-Headedness and Palpitations   Nuclear Pre-Procedure Caffeine/Decaff Intake:  1:00am NPO After: 11am   IV Site: R Forearm  IV 0.9% NS with Angio Cath:  22g  Chest Size (in):  44"  IV Started by: Emmit PomfretAmanda Hicks, RN  Height: 6\' 1"  (1.854 m)  Cup Size: n/a  BMI:  Body mass index is 31.54 kg/(m^2). Weight:  239 lb (108.41 kg)   Tech Comments:  n/a    Nuclear Med Study 1 or 2 day study: 1 day  Stress Test Type:  Stress  Order Authorizing Provider:  Mihai Croitoru,MD   Resting Radionuclide: Technetium 489m Sestamibi  Resting Radionuclide Dose: 10.5 mCi   Stress Radionuclide:  Technetium 8089m Sestamibi  Stress Radionuclide Dose: 29.4 mCi           Stress Protocol Rest HR:75 Stress HR: 155  Rest BP:139/98 Stress BP: 228/96  Exercise Time (min): 7:41 METS: 8.10          Dose of Adenosine (mg):  n/a Dose of Lexiscan: n/a mg  Dose of Atropine (mg): n/a Dose of Dobutamine: n/a mcg/kg/min (at max HR)  Stress Test Technologist: Ernestene MentionGwen Farrington, CCT Nuclear Technologist: Gonzella LexPam Phillips, CNMT   Rest Procedure:  Myocardial perfusion imaging was performed at rest 45 minutes following the intravenous administration of Technetium 2289m Sestamibi. Stress Procedure:  The patient performed treadmill exercise using a Bruce  Protocol for 7 minutes and 41 seconds. The patient stopped due to knee pain and fatigue. Patient denied any chest pain.  There were no  significant ST-T wave changes.  Technetium 6289m Sestamibi was injected IV at peak exercise and myocardial perfusion imaging was performed after a brief delay.  Transient Ischemic Dilatation (Normal <1.22):  0.89 Lung/Heart Ratio (Normal <0.45):  0.34 QGS EDV:  105 ml QGS ESV:  47 ml LV Ejection Fraction: 55%  Rest ECG: NSR - Normal EKG  Stress ECG: No significant change from baseline ECG  QPS Raw Data Images:  Normal; no motion artifact; normal heart/lung ratio. Stress Images:  Inferolateral artifact Rest Images:  Inferolateral artifact Subtraction (SDS):  No evidence of ischemia.  Impression Exercise Capacity:  Good exercise capacity. BP Response:  Hypertensive blood pressure response. Clinical Symptoms:  No significant symptoms noted. ECG Impression:  No significant ST segment change suggestive of ischemia. Comparison with Prior Nuclear Study: No previous nuclear study performed  Overall Impression:  Low risk stress nuclear study with fixed inferolateral bowel artifact. Marked hypertensive response to exercise - study converted to lexiscan.  LV Wall Motion:  NL LV Function; NL Wall Motion; EF 55%.  Brent NoseKenneth C. Hilty, MD, [facility] Surgery CenterFACC Board Certified in Nuclear Cardiology Attending Cardiologist [facility]  Brent NoseHILTY,Brent C, MD  05/06/2013 5:43 PM

## 2013-05-13 ENCOUNTER — Ambulatory Visit (INDEPENDENT_AMBULATORY_CARE_PROVIDER_SITE_OTHER): Payer: BC Managed Care – PPO | Admitting: Cardiology

## 2013-05-13 ENCOUNTER — Encounter: Payer: Self-pay | Admitting: Cardiology

## 2013-05-13 VITALS — BP 138/92 | HR 78 | Ht 73.0 in | Wt 243.1 lb

## 2013-05-13 DIAGNOSIS — I10 Essential (primary) hypertension: Secondary | ICD-10-CM

## 2013-05-13 DIAGNOSIS — R002 Palpitations: Secondary | ICD-10-CM

## 2013-05-13 DIAGNOSIS — R079 Chest pain, unspecified: Secondary | ICD-10-CM

## 2013-05-13 NOTE — Patient Instructions (Signed)
Your physician recommends that you schedule a follow-up appointment  As needed  

## 2013-05-17 DIAGNOSIS — R002 Palpitations: Secondary | ICD-10-CM | POA: Insufficient documentation

## 2013-05-17 DIAGNOSIS — R079 Chest pain, unspecified: Secondary | ICD-10-CM | POA: Insufficient documentation

## 2013-05-17 NOTE — Assessment & Plan Note (Signed)
Decently controlled at 138/92. Continue current meds

## 2013-05-17 NOTE — Assessment & Plan Note (Signed)
Denies further recurrence. Recent NST was low risk for ischemia with normal EF. Will need to work on risk factor modification. Continue meds for HTN and HLD. Discussed smoking cessation.

## 2013-05-17 NOTE — Assessment & Plan Note (Signed)
Denies further recurrence since his last office visit. Cardionet tracings fairly unremarkable. He had a few PVCs, but not significant. I suggested increasing his BB, however he does not want to take this route due to fear of BB related impotence. I feel this is reasonable given the low frequency of his PVCs.

## 2013-05-17 NOTE — Progress Notes (Signed)
Patient ID: Brent Nguyen, male   DOB: 03-30-1965, 48 y.o.   MRN: [id number]     05/17/2013 Brent CheadleRobert Brent CheadleL Nguyen   03-30-1965  [id number]  Primary Physicia Alice ReichertMCINNIS,ANGUS G, MD Primary Cardiologist: Dr. Royann Shiversroitoru  HPI:  The patient is a 48 y/o male with a h/o HTN, obesity, HLD and tobacco abuse who is followed by Dr. Royann Shiversroitoru. He was recently seen in clinic by Ronie Spiesayna Dunn, PA-C, on 04/25/13 for evaluation of chest pain and palpations. She ordered a NST and 2 week event monitor. He returns to clinic today for f/u. He denies any recurrence in symptoms since that visit. He feels that his symptoms were largely due to anxiety related to his job.  Review of his results revealed a normal NST. EF was estimated at 55%. Telemetry strips from Cardionet reveals NSR with 2 episodes of PVCs.    Current Outpatient Prescriptions  Medication Sig Dispense Refill  . amLODipine (NORVASC) 5 MG tablet Take 1 tablet (5 mg total) by mouth daily.  90 tablet  1  . aspirin EC 81 MG tablet Take 1 tablet (81 mg total) by mouth daily.  90 tablet  3  . carvedilol (COREG) 12.5 MG tablet Take 1 tablet (12.5 mg total) by mouth 2 (two) times daily.  180 tablet  3   No current facility-administered medications for this visit.    Allergies  Allergen Reactions  . Penicillins Other (See Comments)    Allergy Test Confirmed     History   Social History  . Marital Status: Married    Spouse Name: N/A    Number of Children: N/A  . Years of Education: N/A   Occupational History  . Not on file.   Social History Main Topics  . Smoking status: Current Every Day Smoker -- 1.00 packs/day for 25 years    Types: Cigarettes  . Smokeless tobacco: Former NeurosurgeonUser  . Alcohol Use: Yes     Comment: On occasion, 1 or 2 beers per month  . Drug Use: No  . Sexual Activity: Yes    Birth Control/ Protection: None   Other Topics Concern  . Not on file   Social History Narrative  . No narrative on file     Review of Systems: General:  negative for chills, fever, night sweats or weight changes.  Cardiovascular: negative for chest pain, dyspnea on exertion, edema, orthopnea, palpitations, paroxysmal nocturnal dyspnea or shortness of breath Dermatological: negative for rash Respiratory: negative for cough or wheezing Urologic: negative for hematuria Abdominal: negative for nausea, vomiting, diarrhea, bright red blood per rectum, melena, or hematemesis Neurologic: negative for visual changes, syncope, or dizziness All other systems reviewed and are otherwise negative except as noted above.    Blood pressure 138/92, pulse 78, height 6\' 1"  (1.854 m), weight 243 lb 1.6 oz (110.269 kg).  General appearance: alert, cooperative and no distress Neck: no carotid bruit and no JVD Lungs: clear to auscultation bilaterally Heart: regular rate and rhythm, S1, S2 normal, no murmur, click, rub or gallop Extremities: no LEE Pulses: 2+ and symmetric Skin: warm and dry Neurologic: Grossly normal  EKG NSR HR 78 bpm  ASSESSMENT AND PLAN:   Chest pain Denies further recurrence. Recent NST was low risk for ischemia with normal EF. Will need to work on risk factor modification. Continue meds for HTN and HLD. Discussed smoking cessation.   Palpitations Denies further recurrence since his last office visit. Cardionet tracings fairly unremarkable. He had a few PVCs, but not  significant. I suggested increasing his BB, however he does not want to take this route due to fear of BB related impotence. I feel this is reasonable given the low frequency of his PVCs.   HTN (hypertension), severe Decently controlled at 138/92. Continue current meds     PLAN  Symptoms resolved. NST was normal. No tachyarrhythmias or bradycardia picked up on telemetry. Can d/c heart monitor today. Continue current meds for HTN and HLD. He was encouraged to continue yearly f/u with Dr. Royann Shiversroitoru. Ok to return to work.   Francy Mcilvaine SimmonsPA-C 05/17/2013 5:33 PM

## 2013-05-21 ENCOUNTER — Telehealth: Payer: Self-pay | Admitting: Cardiovascular Disease

## 2013-05-21 NOTE — Telephone Encounter (Signed)
LM to let patient know his FMLA forms have been filled out by Robbie LisBrittainy Simmons, PA and are up at the front desk for pick up.  Asked that he call me back if he wants me to mail the forms to him.

## 2013-05-21 NOTE — Telephone Encounter (Signed)
Wanting to know if the FMLA papers were faxed over to his job , Also even if they were faxed over .Marland Kitchen. He Racz would like to pick them up as well.. Please Call id no answer please leave a message..  Thanks

## 2013-05-21 NOTE — Telephone Encounter (Signed)
Message forwarded to [facility]J.C. Berlinda LastWildman, LPN/Barbara, CMA.  Pt last seen by Delmer IslamSimmons, PA-C, but is a patient of Dr. Royann Shiversroitoru.

## 2013-05-21 NOTE — Telephone Encounter (Signed)
Closed encounter °

## 2013-06-12 ENCOUNTER — Other Ambulatory Visit: Payer: Self-pay | Admitting: *Deleted

## 2013-06-12 ENCOUNTER — Telehealth: Payer: Self-pay | Admitting: *Deleted

## 2013-06-12 DIAGNOSIS — R42 Dizziness and giddiness: Secondary | ICD-10-CM

## 2013-06-12 DIAGNOSIS — R002 Palpitations: Secondary | ICD-10-CM

## 2013-06-12 NOTE — Telephone Encounter (Signed)
lmptcb for monitor results

## 2013-06-16 NOTE — Telephone Encounter (Signed)
pt notified about montior results, he said the office did go over results and he thanked me for my call today as well. Advised pt if he needs us to call and we will get him an appt, he said ok and thank you

## 2013-07-31 NOTE — Telephone Encounter (Signed)
Encounter complete. 

## 2013-10-25 ENCOUNTER — Other Ambulatory Visit: Payer: Self-pay | Admitting: Cardiovascular Disease

## 2013-10-27 NOTE — Telephone Encounter (Signed)
Rx was sent to pharmacy electronically. 

## 2013-11-08 ENCOUNTER — Other Ambulatory Visit: Payer: Self-pay | Admitting: Cardiovascular Disease

## 2013-11-10 ENCOUNTER — Other Ambulatory Visit: Payer: Self-pay | Admitting: Cardiovascular Disease

## 2013-11-10 NOTE — Telephone Encounter (Signed)
Rx was sent to pharmacy electronically. 

## 2013-11-10 NOTE — Telephone Encounter (Signed)
Refilled #90 tablets with 1 refill on 11/10/2013

## 2014-01-09 ENCOUNTER — Emergency Department (HOSPITAL_COMMUNITY)
Admission: EM | Admit: 2014-01-09 | Discharge: 2014-01-09 | Disposition: A | Discharge status: Home / Self Care | Payer: BC Managed Care – PPO | Attending: Emergency Medicine | Admitting: Emergency Medicine

## 2014-01-09 ENCOUNTER — Encounter (HOSPITAL_COMMUNITY): Payer: Self-pay | Admitting: Emergency Medicine

## 2014-01-09 DIAGNOSIS — Y998 Other external cause status: Secondary | ICD-10-CM | POA: Insufficient documentation

## 2014-01-09 DIAGNOSIS — Z79899 Other long term (current) drug therapy: Secondary | ICD-10-CM | POA: Diagnosis not present

## 2014-01-09 DIAGNOSIS — M5117 Intervertebral disc disorders with radiculopathy, lumbosacral region: Secondary | ICD-10-CM | POA: Insufficient documentation

## 2014-01-09 DIAGNOSIS — T148XXA Other injury of unspecified body region, initial encounter: Secondary | ICD-10-CM

## 2014-01-09 DIAGNOSIS — Z880 Allergy status to penicillin: Secondary | ICD-10-CM | POA: Insufficient documentation

## 2014-01-09 DIAGNOSIS — S39012A Strain of muscle, fascia and tendon of lower back, initial encounter: Secondary | ICD-10-CM | POA: Diagnosis not present

## 2014-01-09 DIAGNOSIS — Z720 Tobacco use: Secondary | ICD-10-CM | POA: Insufficient documentation

## 2014-01-09 DIAGNOSIS — Z8639 Personal history of other endocrine, nutritional and metabolic disease: Secondary | ICD-10-CM | POA: Insufficient documentation

## 2014-01-09 DIAGNOSIS — S3992XA Unspecified injury of lower back, initial encounter: Secondary | ICD-10-CM | POA: Diagnosis present

## 2014-01-09 DIAGNOSIS — X58XXXA Exposure to other specified factors, initial encounter: Secondary | ICD-10-CM | POA: Insufficient documentation

## 2014-01-09 DIAGNOSIS — Y9389 Activity, other specified: Secondary | ICD-10-CM | POA: Diagnosis not present

## 2014-01-09 DIAGNOSIS — Z7982 Long term (current) use of aspirin: Secondary | ICD-10-CM | POA: Insufficient documentation

## 2014-01-09 DIAGNOSIS — I10 Essential (primary) hypertension: Secondary | ICD-10-CM | POA: Diagnosis not present

## 2014-01-09 DIAGNOSIS — M62830 Muscle spasm of back: Secondary | ICD-10-CM | POA: Diagnosis not present

## 2014-01-09 DIAGNOSIS — E669 Obesity, unspecified: Secondary | ICD-10-CM | POA: Diagnosis not present

## 2014-01-09 DIAGNOSIS — M5136 Other intervertebral disc degeneration, lumbar region: Secondary | ICD-10-CM

## 2014-01-09 DIAGNOSIS — Y9289 Other specified places as the place of occurrence of the external cause: Secondary | ICD-10-CM | POA: Diagnosis not present

## 2014-01-09 MED ORDER — ONDANSETRON HCL 4 MG PO TABS
4.0000 mg | ORAL_TABLET | Freq: Once | ORAL | Status: AC
  Administered 2014-01-09: 4 mg via ORAL
  Filled 2014-01-09: qty 1

## 2014-01-09 MED ORDER — PREDNISONE 50 MG PO TABS
60.0000 mg | ORAL_TABLET | Freq: Once | ORAL | Status: AC
  Administered 2014-01-09: 22:00:00 60 mg via ORAL
  Filled 2014-01-09 (×2): qty 1

## 2014-01-09 MED ORDER — DEXAMETHASONE 6 MG PO TABS: ORAL_TABLET | ORAL | Status: DC

## 2014-01-09 MED ORDER — KETOROLAC TROMETHAMINE 10 MG PO TABS
10.0000 mg | ORAL_TABLET | Freq: Once | ORAL | Status: AC
  Administered 2014-01-09: 10 mg via ORAL
  Filled 2014-01-09: qty 1

## 2014-01-09 MED ORDER — DIAZEPAM 5 MG PO TABS
10.0000 mg | ORAL_TABLET | Freq: Once | ORAL | Status: AC
  Administered 2014-01-09: 10 mg via ORAL
  Filled 2014-01-09: qty 2

## 2014-01-09 MED ORDER — DIAZEPAM 5 MG PO TABS: 5.0000 mg | ORAL_TABLET | Freq: Three times a day (TID) | ORAL | Status: DC

## 2014-01-09 MED ORDER — CELECOXIB 100 MG PO CAPS: 100.0000 mg | ORAL_CAPSULE | Freq: Two times a day (BID) | ORAL | Status: DC

## 2014-01-09 NOTE — Discharge Instructions (Signed)
Heating pad may be helpful to the lower back. Please rest your back is much as possible. Please see your neurosurgeon as soon as possible next week for recheck. Please use Decadron and Celebrex 2 times daily with food. Please use diazepam 3 times daily for spasm pain. Diazepam may cause drowsiness, please do not drive, operate machinery, drink alcohol, or participate in activities that require concentration while taking this medication. Muscle Strain A muscle strain (pulled muscle) happens when a muscle is stretched beyond normal length. It happens when a sudden, violent force stretches your muscle too far. Usually, a few of the fibers in your muscle are torn. Muscle strain is common in athletes. Recovery usually takes 1-2 weeks. Complete healing takes 5-6 weeks.  HOME CARE   Follow the PRICE method of treatment to help your injury get better. Do this the first 2-3 days after the injury:  Protect. Protect the muscle to keep it from getting injured again.  Rest. Limit your activity and rest the injured body part.  Ice. Put ice in a plastic bag. Place a towel between your skin and the bag. Then, apply the ice and leave it on from 15-20 minutes each hour. After the third day, switch to moist heat packs.  Compression. Use a splint or elastic bandage on the injured area for comfort. Do not put it on too tightly.  Elevate. Keep the injured body part above the level of your heart.  Only take medicine as told by your doctor.  Warm up before doing exercise to prevent future muscle strains. GET HELP IF:   You have more pain or puffiness (swelling) in the injured area.  You feel numbness, tingling, or notice a loss of strength in the injured area. MAKE SURE YOU:   Understand these instructions.  Will watch your condition.  Will get help right away if you are not doing well or get worse. Document Released: 10/26/2007 Document Revised: 11/06/2012 Document Reviewed: 08/15/2012 [facility] Patient  Information 2015 [place], [place]. This information is not intended to replace advice given to you by your health care provider. Make sure you discuss any questions you have with your health care provider.  Degenerative Disk Disease Degenerative disk disease is a condition caused by the changes that occur in the cushions of the backbone (spinal disks) as you grow older. Spinal disks are soft and compressible disks located between the bones of the spine (vertebrae). They act like shock absorbers. Degenerative disk disease can affect the whole spine. However, the neck and lower back are most commonly affected. Many changes can occur in the spinal disks with aging, such as:  The spinal disks may dry and shrink.  Small tears may occur in the tough, outer covering of the disk (annulus).  The disk space may become smaller due to loss of water.  Abnormal growths in the bone (spurs) may occur. This can put pressure on the nerve roots exiting the spinal canal, causing pain.  The spinal canal may become narrowed. CAUSES  Degenerative disk disease is a condition caused by the changes that occur in the spinal disks with aging. The exact cause is not known, but there is a genetic basis for many patients. Degenerative changes can occur due to loss of fluid in the disk. This makes the disk thinner and reduces the space between the backbones. Small cracks can develop in the outer layer of the disk. This can lead to the breakdown of the disk. You are more likely to get degenerative  disk disease if you are overweight. Smoking cigarettes and doing heavy work such as weightlifting can also increase your risk of this condition. Degenerative changes can start after a sudden injury. Growth of bone spurs can compress the nerve roots and cause pain.  SYMPTOMS  The symptoms vary from person to person. Some people may have no pain, while others have severe pain. The pain may be so severe that it can limit your activities. The  location of the pain depends on the part of your backbone that is affected. You will have neck or arm pain if a disk in the neck area is affected. You will have pain in your back, buttocks, or legs if a disk in the lower back is affected. The pain becomes worse while bending, reaching up, or with twisting movements. The pain may start gradually and then get worse as time passes. It may also start after a major or minor injury. You may feel numbness or tingling in the arms or legs.  DIAGNOSIS  Your caregiver will ask you about your symptoms and about activities or habits that may cause the pain. He or she may also ask about any injuries, diseases, or treatments you have had earlier. Your caregiver will examine you to check for the range of movement that is possible in the affected area, to check for strength in your extremities, and to check for sensation in the areas of the arms and legs supplied by different nerve roots. An X-ray of the spine may be taken. Your caregiver may suggest other imaging tests, such as magnetic resonance imaging (MRI), if needed.  TREATMENT  Treatment includes rest, modifying your activities, and applying ice and heat. Your caregiver may prescribe medicines to reduce your pain and may ask you to do some exercises to strengthen your back. In some cases, you may need surgery. You and your caregiver will decide on the treatment that is best for you. HOME CARE INSTRUCTIONS   Follow proper lifting and walking techniques as advised by your caregiver.  Maintain good posture.  Exercise regularly as advised.  Perform relaxation exercises.  Change your sitting, standing, and sleeping habits as advised. Change positions frequently.  Lose weight as advised.  Stop smoking if you smoke.  Wear supportive footwear. SEEK MEDICAL CARE IF:  Your pain does not go away within 1 to 4 weeks. SEEK IMMEDIATE MEDICAL CARE IF:   Your pain is severe.  You notice weakness in your arms,  hands, or legs.  You begin to lose control of your bladder or bowel movements. MAKE SURE YOU:   Understand these instructions.  Will watch your condition.  Will get help right away if you are not doing well or get worse. Document Released: 11/13/2006 Document Revised: 04/10/2011 Document Reviewed: 05/20/2013 [facility]queExit[facility] Patient Information 2015 [place], [place]LL[place]. This information is not intended to replace advice given to you by your health care provider. Make sure you discuss any questions you have with your health care provider.

## 2014-01-09 NOTE — ED Notes (Signed)
Patient reports lower back pain x 3 weeks. States felt something pull in back today and pain has worsened.

## 2014-01-09 NOTE — ED Provider Notes (Signed)
CSN: [id number]     Arrival date & time 01/09/14  2055 History   First MD Initiated Contact with Patient 01/09/14 2119     Chief Complaint  Patient presents with  . Back Pain     (Consider location/radiation/quality/duration/timing/severity/associated sxs/prior Treatment) HPI Comments: Patient is a 48 year old male who presents to the emergency department with a complaint of lower back pain. The patient states that approximately 3 or 4 weeks ago he bent in an awkward position and felt a pull in his back. He states this gradually got some better, but never completely went away. Today while working the patient felt something pull in his lower back while working, and this time it was accompanied by a tingling sensation in the back of his knee and the arch of his foot. He states that after that the pain just Getting progressively worse. He presents to the emergency department for additional evaluation. It is of note that approximately 1 year ago the patient had an MRI of his lower back which revealed some degenerative disc disease. At that time he had an injection of the back area, and has been doing reasonably well since that time. Today he states that certain subtle movements cause him a great deal of discomfort.  Patient is a 48 y.o. male presenting with back pain. The history is provided by the patient.  Back Pain Location:  Lumbar spine Associated symptoms: no abdominal pain, no chest pain and no dysuria     Past Medical History  Diagnosis Date  . Diverticulosis     a. s/p partial colectomy 2005 for anastamotic leak with reversal.  . Hyperlipidemia   . Hypertension   . Tobacco abuse   . Obesity    Past Surgical History  Procedure Laterality Date  . 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 Colostomy reversal    . Knee arthroscopy Right   . Wisdom tooth extraction    . Knee arthroscopy Right 01/20/2013    Procedure: ARTHROSCOPY KNEE;   Surgeon: Darreld McleanWayne Keeling, MD;  Location: [facility];  Service: Orthopedics;  Laterality: Right;  . Menisectomy Right 01/20/2013    Procedure: partial medial menisectomy;  Surgeon: Darreld McleanWayne Keeling, MD;  Location: [facility];  Service: Orthopedics;  Laterality: Right;   Family History  Problem Relation Age of Onset  . Hypertension Mother   . Diverticulosis Mother   . Sleep apnea Mother   . Celiac disease Mother   . Ulcers Mother   . Cancer Father     Brain  . Diverticulosis Brother   . Ulcers Brother   . Celiac disease Brother   . Hypotension Brother   . Cancer Maternal Grandmother     Breast  . Ulcers Maternal Grandfather   . ALS Maternal Grandfather   . Diabetes Maternal Grandfather   . Heart attack Paternal Grandmother   . Hypertension Paternal Grandmother   . Cancer Paternal Grandfather     Melanoma  . Diverticulosis Daughter    History  Substance Use Topics  . Smoking status: Current Every Day Smoker -- 1.00 packs/day for 25 years    Types: Cigarettes  . Smokeless tobacco: Former NeurosurgeonUser  . Alcohol Use: Yes     Comment: On occasion, 1 or 2 beers per month    Review of Systems  Constitutional: Negative for activity change.       All ROS Neg except as noted in HPI  Eyes: Negative for photophobia and discharge.  Respiratory: Negative for cough, shortness of breath and wheezing.   Cardiovascular: Negative for chest pain and palpitations.  Gastrointestinal: Negative for abdominal pain and blood in stool.  Genitourinary: Negative for dysuria, frequency and hematuria.  Musculoskeletal: Positive for back pain. Negative for arthralgias and neck pain.  Skin: Negative.   Neurological: Negative for dizziness, seizures and speech difficulty.  Psychiatric/Behavioral: Negative for hallucinations and confusion.      Allergies  Penicillins  Home Medications   Prior to Admission medications   Medication Sig Start Date End Date Taking? Authorizing Provider  amLODipine (NORVASC) 5 MG  tablet TAKE 1 TABLET (5 MG TOTAL) BY MOUTH DAILY. 11/10/13  Yes Mihai Croitoru, MD  aspirin EC 81 MG tablet Take 1 tablet (81 mg total) by mouth daily. 04/25/13  Yes Chrystie NoseKenneth C. Hilty, MD  carvedilol (COREG) 12.5 MG tablet TAKE 1 TABLET BY MOUTH TWICE A DAY 10/27/13  Yes Mihai Croitoru, MD   BP 146/86 mmHg  Pulse 75  Temp(Src) 98.3 F (36.8 C) (Oral)  Resp 18  Ht 6\' 1"  (1.854 m)  Wt 238 lb (107.956 kg)  BMI 31.41 kg/m2  SpO2 99% Physical Exam  Constitutional: He is oriented to person, place, and time. He appears well-developed and well-nourished.  Non-toxic appearance.  HENT:  Head: Normocephalic.  Right Ear: Tympanic membrane and external ear normal.  Left Ear: Tympanic membrane and external ear normal.  Eyes: EOM and lids are normal. Pupils are equal, round, and reactive to light.  Neck: Normal range of motion. Neck supple. Carotid bruit is not present.  Cardiovascular: Normal rate, regular rhythm, normal heart sounds, intact distal pulses and normal pulses.   Pulmonary/Chest: Breath sounds normal. No respiratory distress.  Abdominal: Soft. Bowel sounds are normal. There is no tenderness. There is no guarding.  Musculoskeletal:       Lumbar back: He exhibits decreased range of motion, tenderness, pain and spasm.  Lymphadenopathy:       Head (right side): No submandibular adenopathy present.       Head (left side): No submandibular adenopathy present.    He has no cervical adenopathy.  Neurological: He is alert and oriented to person, place, and time. He has normal strength. No cranial nerve deficit or sensory deficit.  Gait is intact. No foot drop. No sensory deficit.  Skin: Skin is warm and dry.  Psychiatric: He has a normal mood and affect. His speech is normal.  Nursing note and vitals reviewed.   ED Course  Procedures (including critical care time) Labs Review Labs Reviewed - No data to display  Imaging Review No results found.   EKG Interpretation None       MDM  No acute neuro deficit appreciated. Examination supports muscle strain aggravating degenerative disc disease of the lower back.  I have reviewed the previous MRI of the lumbar spine.  The plan at this time is for the patient to see his neurosurgeon next week for recheck. Prescription for Celebrex, Decadron, and Valium given to the patient to assist with his discomfort. Patient is to return to the emergency department if any emergent changes, problems, or concerns.  A work note excusing the patient for work to return on Wednesday, December 16 has been given to the patient.    Final diagnoses:  None    *I have reviewed nursing notes, vital signs, and all appropriate lab and imaging results for this patient.**    Kathie DikeHobson M Charliegh Vasudevan, PA-C 01/09/14 2207  Suzi RootsKevin E Steinl, MD 01/10/14 [phone]

## 2014-05-12 ENCOUNTER — Other Ambulatory Visit: Payer: Self-pay | Admitting: Cardiovascular Disease

## 2014-05-12 NOTE — Telephone Encounter (Signed)
Rx(s) sent to pharmacy electronically.  

## 2014-08-10 ENCOUNTER — Other Ambulatory Visit: Payer: Self-pay | Admitting: Cardiovascular Disease

## 2014-08-13 ENCOUNTER — Other Ambulatory Visit: Payer: Self-pay | Admitting: Cardiovascular Disease

## 2014-08-13 NOTE — Telephone Encounter (Signed)
Rx(s) sent to pharmacy electronically.  

## 2014-09-09 ENCOUNTER — Other Ambulatory Visit: Payer: Self-pay | Admitting: Cardiovascular Disease

## 2014-09-14 ENCOUNTER — Other Ambulatory Visit: Payer: Self-pay | Admitting: *Deleted

## 2014-09-14 MED ORDER — AMLODIPINE BESYLATE 5 MG PO TABS: 5.0000 mg | ORAL_TABLET | Freq: Every day | ORAL | Status: DC

## 2014-09-21 ENCOUNTER — Other Ambulatory Visit: Payer: Self-pay | Admitting: *Deleted

## 2014-09-22 ENCOUNTER — Other Ambulatory Visit: Payer: Self-pay | Admitting: *Deleted

## 2014-09-22 MED ORDER — AMLODIPINE BESYLATE 5 MG PO TABS: 5.0000 mg | ORAL_TABLET | Freq: Every day | ORAL | Status: DC

## 2014-10-11 ENCOUNTER — Other Ambulatory Visit: Payer: Self-pay | Admitting: Cardiovascular Disease

## 2014-10-14 ENCOUNTER — Telehealth: Payer: Self-pay | Admitting: Cardiovascular Disease

## 2014-10-14 MED ORDER — AMLODIPINE BESYLATE 5 MG PO TABS: 5.0000 mg | ORAL_TABLET | Freq: Every day | ORAL | Status: DC

## 2014-10-14 MED ORDER — CARVEDILOL 12.5 MG PO TABS: 12.5000 mg | ORAL_TABLET | Freq: Two times a day (BID) | ORAL | Status: DC

## 2014-10-14 NOTE — Telephone Encounter (Signed)
°  1. Which medications need to be refilled? Carvedilol and Amlodipine   2. Which pharmacy is medication to be sent to?[facility] in [place]   3. Do they need a 30 day or 90 day supply? He did not say   4. Would they like a call back once the medication has been sent to the pharmacy? Yes

## 2014-10-14 NOTE — Telephone Encounter (Signed)
Returned call to patient carvedilol and amlodipine refills sent to pharmacy.Advised to keep appointment with Dr.Croitoru 12/07/14 at 8:45 am.

## 2014-12-04 ENCOUNTER — Encounter: Payer: Self-pay | Admitting: Cardiovascular Disease

## 2014-12-04 NOTE — Progress Notes (Signed)
Patient ID: Brent Nguyen, male   DOB: Nov 26, 1965, 49 y.o.   MRN: [id number]      Cardiology Office Note   Date:  12/07/2014   ID:  Brent Nguyen, DOB [birth year], MRN [id number]  PCP:  Alice Reichert, MD  Cardiologist:   Thurmon Fair, MD   Chief Complaint  Patient presents with  . Annual Exam  . Shortness of Breath    DEPENDING OBN HOW HE BREATHES  . Edema    FEET AT TIMES      History of Present Illness: Brent Nguyen is a 49 y.o. male who presents for HTN, hyperlipidemia and weight issues.  He continues to smoke cigarettes. In 2015 he had a normal nuclear stress test and his monitor showed occasional PVCs only.   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 He denies any cardiovascular complaints. He is physically active. He has lost weight.  Past Medical History  Diagnosis Date  . Diverticulosis     a. s/p partial colectomy 2005 for anastamotic leak with reversal.  . Hyperlipidemia   . Hypertension   . Tobacco abuse   . Obesity     Past Surgical History  Procedure Laterality Date  . 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 Colostomy reversal    . Knee arthroscopy Right   . Wisdom tooth extraction    . Knee arthroscopy Right 01/20/2013    Procedure: ARTHROSCOPY KNEE;  Surgeon: Darreld Mclean, MD;  Location: [facility];  Service: Orthopedics;  Laterality: Right;  . Menisectomy Right 01/20/2013    Procedure: partial medial menisectomy;  Surgeon: Darreld Mclean, MD;  Location: [facility];  Service: Orthopedics;  Laterality: Right;     Current Outpatient Prescriptions  Medication Sig Dispense Refill  . amLODipine (NORVASC) 5 MG tablet Take 1 tablet (5 mg total) by mouth daily. 30 tablet 2  . aspirin EC 81 MG tablet Take 1 tablet (81 mg total) by mouth daily. 90 tablet 3  . carvedilol (COREG) 12.5 MG tablet Take 1 tablet (12.5 mg total) by mouth 2 (two) times daily. 60  tablet 2   No current facility-administered medications for this visit.    Allergies:   Penicillins    Social History:  The patient  reports that he has been smoking Cigarettes.  He has a 25 pack-year smoking history. He has quit using smokeless tobacco. He reports that he drinks alcohol. He reports that he does not use illicit drugs.   Family History:  The patient's family history includes ALS in his maternal grandfather; Cancer in his father, maternal grandmother, and paternal grandfather; Celiac disease in his brother and mother; Diabetes in his maternal grandfather; Diverticulosis in his brother, daughter, and mother; Heart attack in his paternal grandmother; Hypertension in his mother and paternal grandmother; Hypotension in his brother; Sleep apnea in his mother; Ulcers in his brother, maternal grandfather, and mother.    ROS:  Please see the history of present illness.    Otherwise, review of systems positive for none.   All other systems are reviewed and negative.    PHYSICAL EXAM: VS:  BP 120/91 mmHg  Pulse 66  Resp 16  Ht  (1.854 m)  Wt 231 lb (104.781 kg)  BMI 30.48 kg/m2 , BMI Body mass index is 30.48 kg/(m^2).  General: Alert, oriented x3, no distress Head: no evidence of trauma, PERRL, EOMI, no exophtalmos  or lid lag, no myxedema, no xanthelasma; normal ears, nose and oropharynx Neck: normal jugular venous pulsations and no hepatojugular reflux; brisk carotid pulses without delay and no carotid bruits Chest: clear to auscultation, no signs of consolidation by percussion or palpation, normal fremitus, symmetrical and full respiratory excursions Cardiovascular: normal position and quality of the apical impulse, regular rhythm, normal first and second heart sounds, no murmurs, rubs or gallops Abdomen: no tenderness or distention, no masses by palpation, no abnormal pulsatility or arterial bruits, normal bowel sounds, no hepatosplenomegaly Extremities: no clubbing,  cyanosis or edema; 2+ radial, ulnar and brachial pulses bilaterally; 2+ right femoral, posterior tibial and dorsalis pedis pulses; 2+ left femoral, posterior tibial and dorsalis pedis pulses; no subclavian or femoral bruits Neurological: grossly nonfocal Psych: euthymic mood, full affect   EKG:  EKG is ordered today. The ekg ordered today demonstrates NSR   Recent Labs: No results found for requested labs within last 365 days.    Lipid Panel No results found for: CHOL, TRIG, HDL, CHOLHDL, VLDL, LDLCALC, LDLDIRECT    Wt Readings from Last 3 Encounters:  12/07/14 231 lb (104.781 kg)  01/09/14 238 lb (107.956 kg)  05/13/13 243 lb 1.6 oz (110.269 kg)    .   ASSESSMENT AND PLAN:  Since he has not been working he has not had any further chest pain. He feels that his chest discomfort was due to complex at work. Similarly his palpitations have abated. Previous arrhythmia monitoring showed only occasional PVCs. His blood pressure was initially elevated in the office and remain high although it did improve as time passed. He states that at home his blood pressures consistently in the 100s/80s (his daughter is a Engineer, civil (consulting)nurse and checks his blood pressure routinely).  No changes are made to his medicines. He is encouraged to stop smoking , exercise regularly and monitor his blood pressure. To call if it systolic blood pressures consistently over 140 or diastolic blood pressure consistently over 90.    Current medicines are reviewed at length with the patient today.  The patient does not have concerns regarding medicines.  The following changes have been made:  no change  Labs/ tests ordered today include:  No orders of the defined types were placed in this encounter.     Patient Instructions  Your physician discussed the hazards of tobacco use. Tobacco use cessation is recommended and techniques and options to help you quit were discussed.   Dr. Royann Shiversroitoru recommends that you schedule a  follow-up appointment in: ONE YEAR        SignedThurmon Fair, Ulyssa Walthour, MD  12/07/2014 9:07 AM    Thurmon FairMihai Calyb Mcquarrie, MD, [facility] [phone] office [phone] pager

## 2014-12-07 ENCOUNTER — Encounter: Payer: Self-pay | Admitting: Cardiovascular Disease

## 2014-12-07 ENCOUNTER — Ambulatory Visit (INDEPENDENT_AMBULATORY_CARE_PROVIDER_SITE_OTHER): Payer: BLUE CROSS/BLUE SHIELD | Admitting: Cardiovascular Disease

## 2014-12-07 VITALS — BP 120/91 | HR 66 | Resp 16 | Ht 73.0 in | Wt 231.0 lb

## 2014-12-07 DIAGNOSIS — R002 Palpitations: Secondary | ICD-10-CM

## 2014-12-07 DIAGNOSIS — E669 Obesity, unspecified: Secondary | ICD-10-CM | POA: Diagnosis not present

## 2014-12-07 DIAGNOSIS — I10 Essential (primary) hypertension: Secondary | ICD-10-CM | POA: Diagnosis not present

## 2014-12-07 NOTE — Patient Instructions (Signed)
Your physician discussed the hazards of tobacco use. Tobacco use cessation is recommended and techniques and options to help you quit were discussed.   Dr. Croitoru recommends that you schedule a follow-up appointment in: ONE YEAR   

## 2015-01-03 ENCOUNTER — Other Ambulatory Visit: Payer: Self-pay | Admitting: Cardiovascular Disease

## 2015-01-04 NOTE — Telephone Encounter (Signed)
Rx request sent to pharmacy.  

## 2015-01-14 ENCOUNTER — Other Ambulatory Visit: Payer: Self-pay | Admitting: Cardiovascular Disease

## 2015-01-14 NOTE — Telephone Encounter (Signed)
Rx(s) sent to pharmacy electronically.  

## 2015-03-24 IMAGING — US US EXTREM LOW VENOUS*R*
1 series · 14 of 24 positions shown · non-contrast
Comparison: None

CLINICAL DATA: Pain and swelling

EXAM:
Right LOWER EXTREMITY VENOUS DOPPLER ULTRASOUND
TECHNIQUE: Gray-scale sonography with graded compression, as well as color
Doppler and duplex ultrasound, were performed to evaluate the deep
venous system from the level of the common femoral vein through the
popliteal and proximal calf veins. Spectral Doppler was utilized to
evaluate flow at rest and with distal augmentation maneuvers.

[Series 1: us extrem low venous*right* · 0.08mm/px · 14 of 58 slices shown]
[im 1/58]
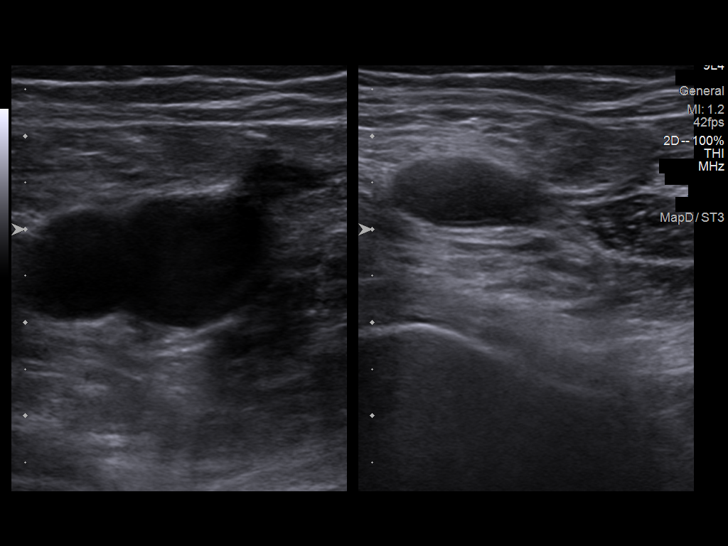
[im 5/58]
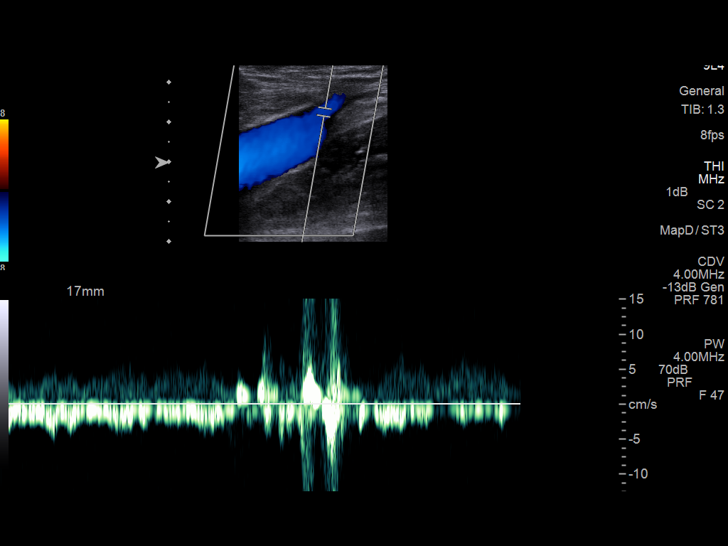
[im 10/58]
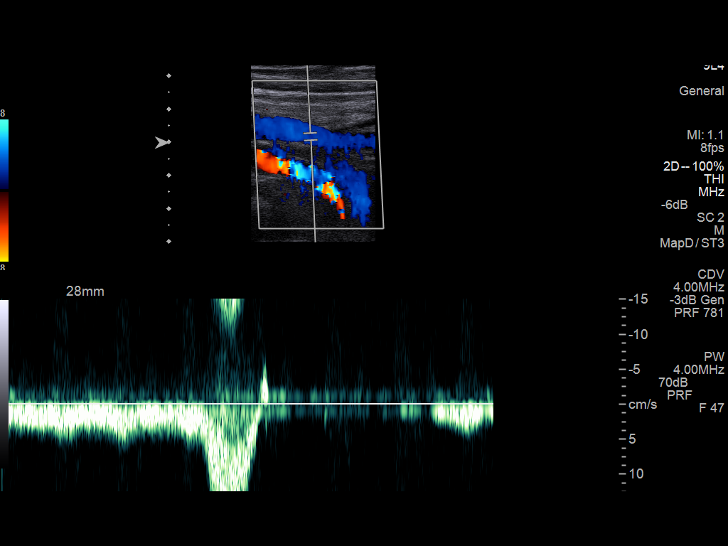
[im 15/58]
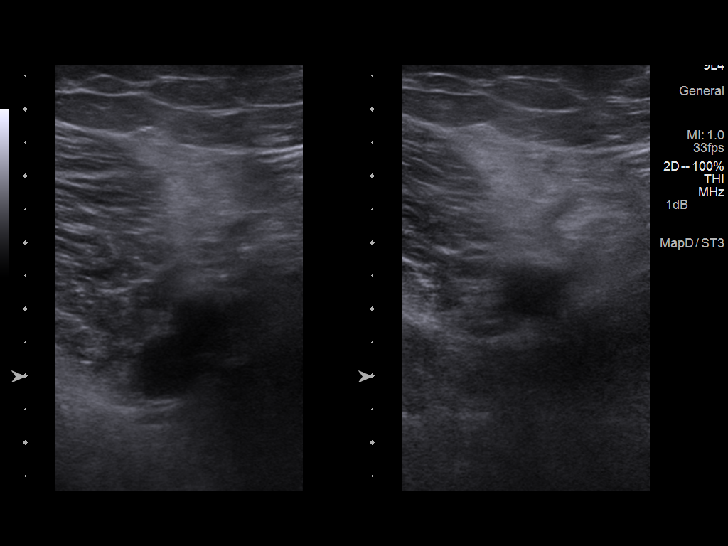
[im 18/58]
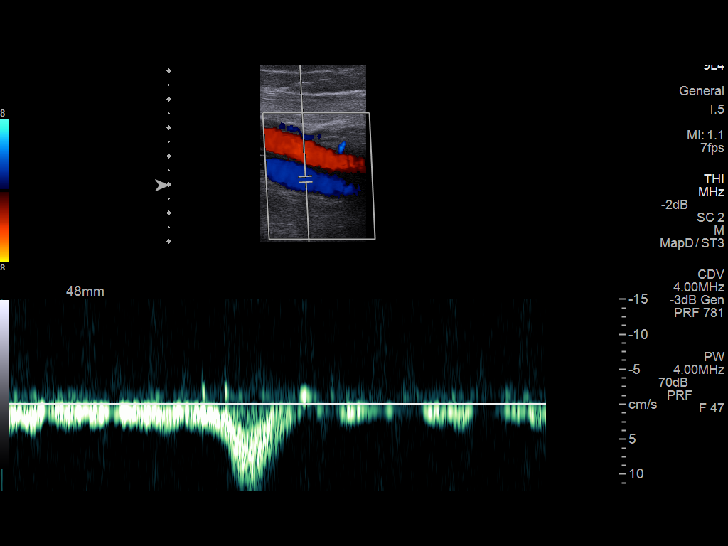
[im 23/58]
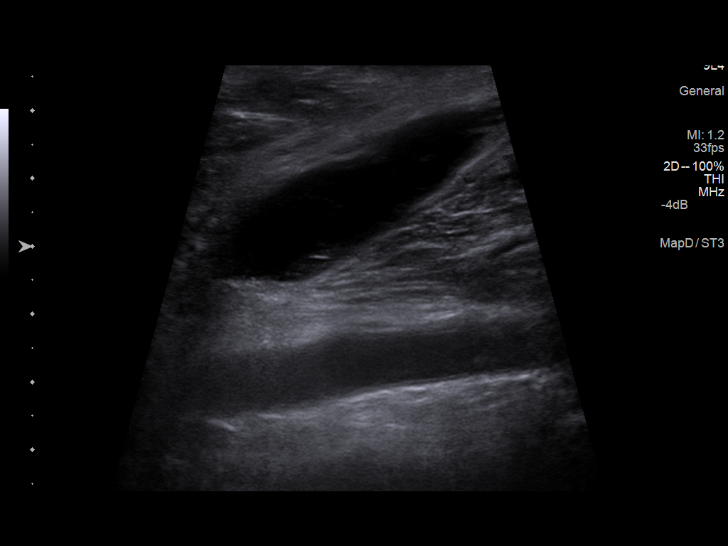
[im 28/58]
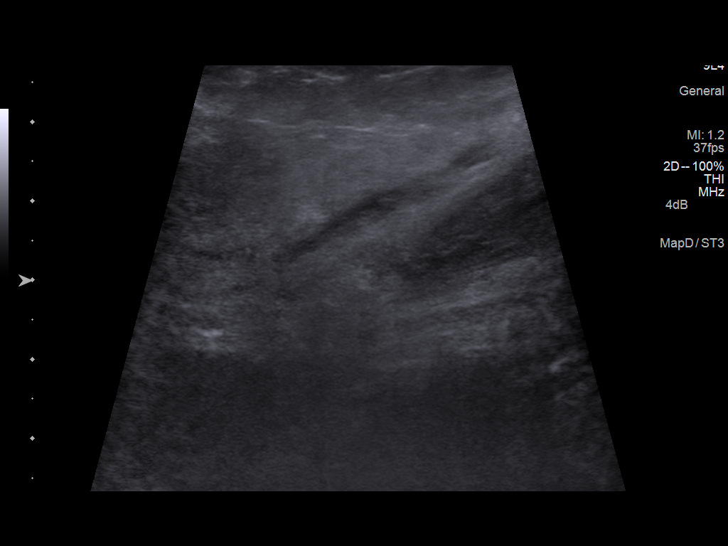
[im 30/58]
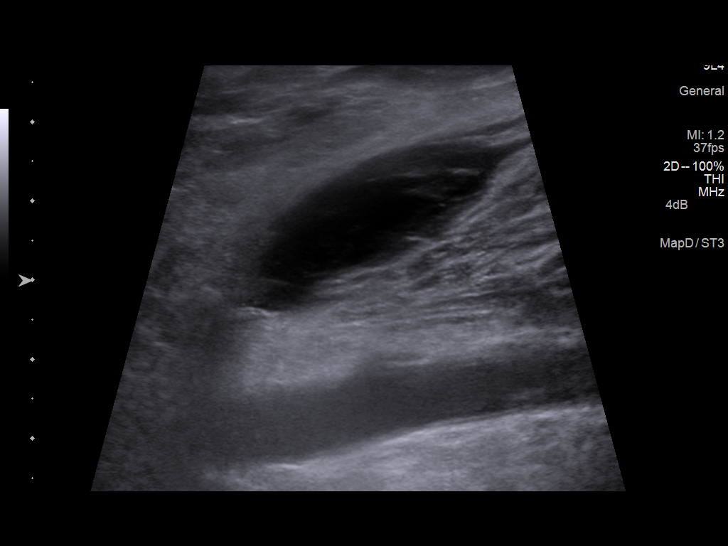
[im 35/58]
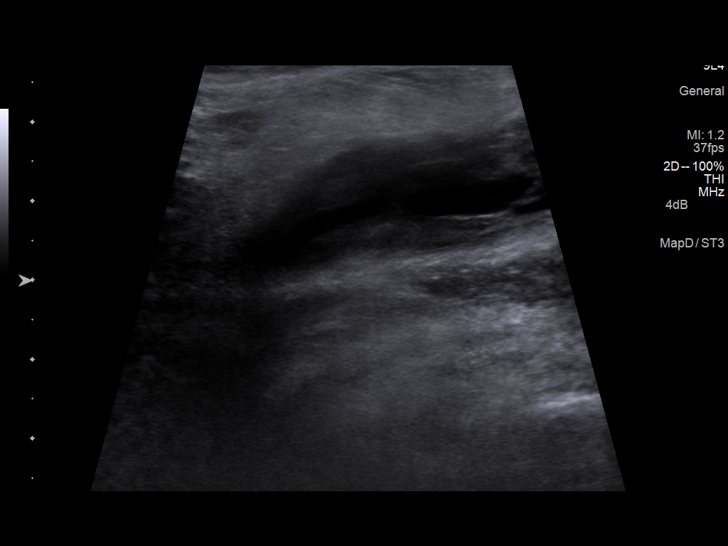
[im 40/58]
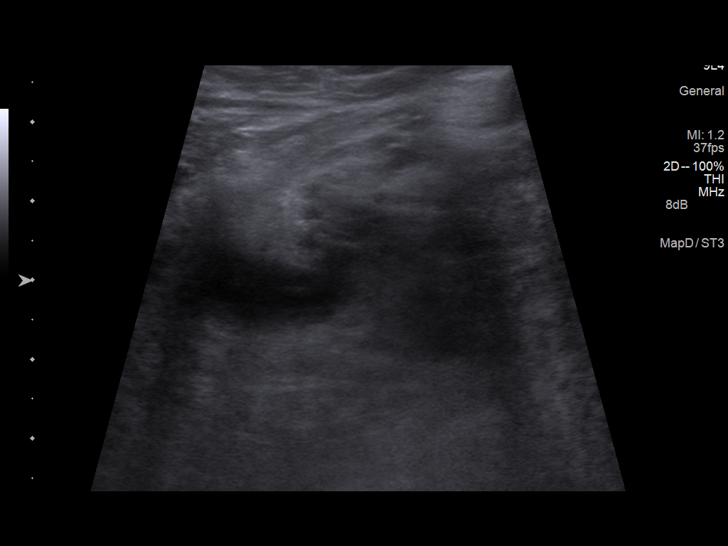
[im 45/58]
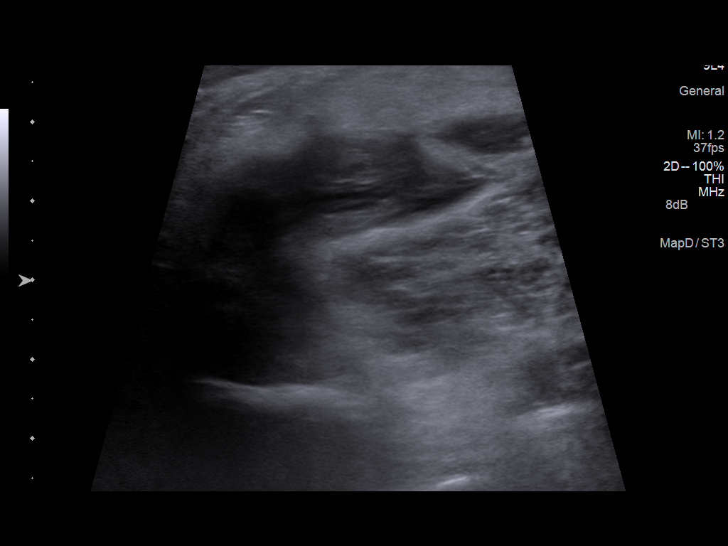
[im 48/58]
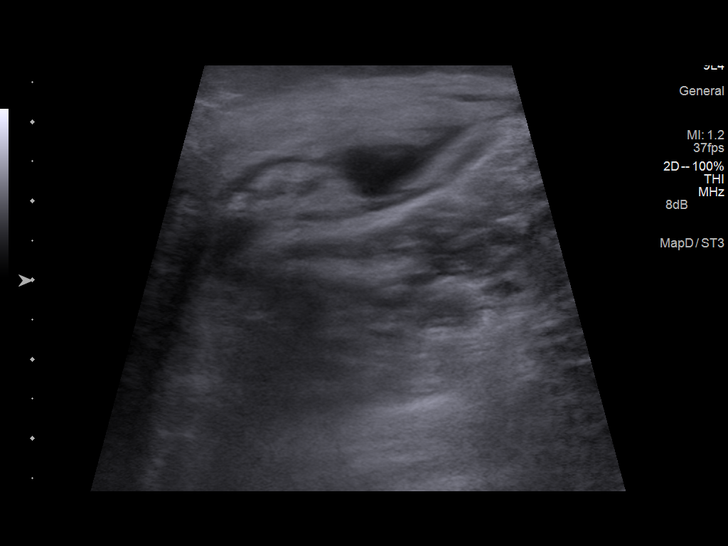
[im 53/58]
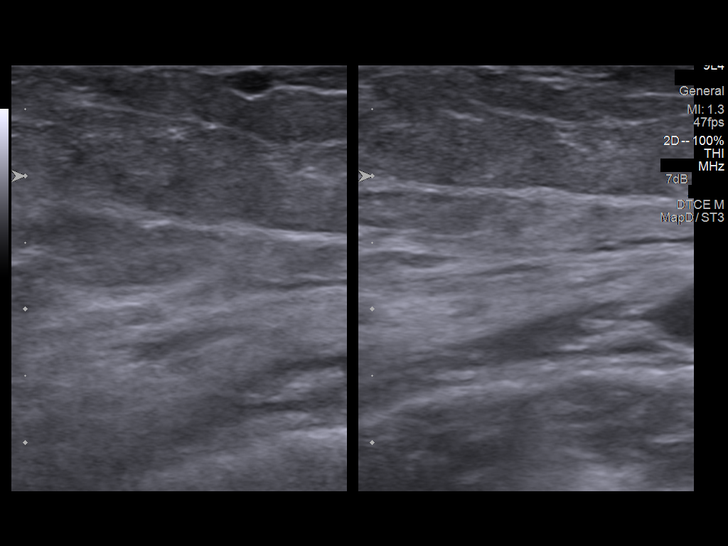
[im 58/58]
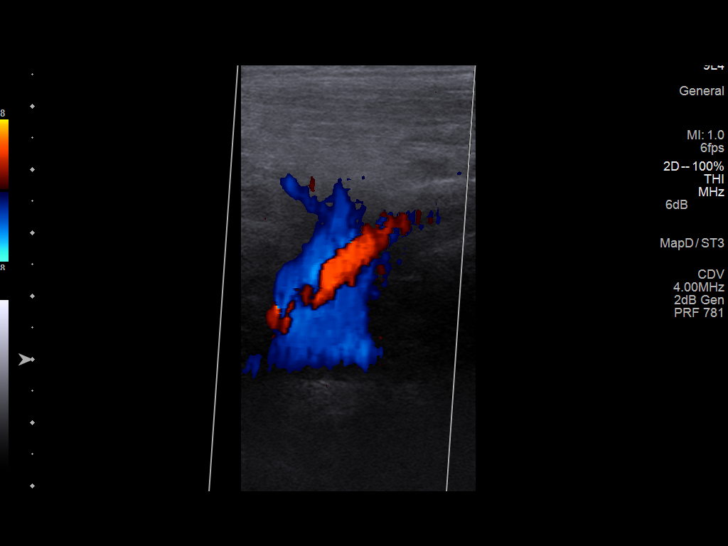

[14 of 24 positions shown; findings below may reference images not displayed]

FINDINGS: Thrombus within deep veins:  None visualized.

Compressibility of deep veins:  Normal.

Duplex waveform respiratory phasicity:  Normal.

Duplex waveform response to augmentation:  Normal.

Venous reflux:  None visualized.

Other findings: Complex collection identified at right popliteal
fossa, 4.9 x 1.3 x 4.6 cm, likely representing a complicated Baker
cyst.
IMPRESSION: No evidence of deep venous thrombosis in the right lower extremity.

Probable complicated Baker cyst at right popliteal fossa 4.9 x 1.3 x
4.6 cm in size.

## 2015-08-02 ENCOUNTER — Other Ambulatory Visit: Payer: Self-pay | Admitting: Cardiovascular Disease

## 2015-08-02 MED ORDER — CARVEDILOL 12.5 MG PO TABS: 12.5000 mg | ORAL_TABLET | Freq: Two times a day (BID) | ORAL | Status: DC

## 2015-08-02 NOTE — Telephone Encounter (Signed)
Rx request sent to pharmacy.  

## 2015-09-15 ENCOUNTER — Ambulatory Visit (INDEPENDENT_AMBULATORY_CARE_PROVIDER_SITE_OTHER): Payer: BLUE CROSS/BLUE SHIELD | Admitting: Cardiovascular Disease

## 2015-09-15 ENCOUNTER — Encounter: Payer: Self-pay | Admitting: Cardiovascular Disease

## 2015-09-15 ENCOUNTER — Encounter (INDEPENDENT_AMBULATORY_CARE_PROVIDER_SITE_OTHER): Payer: Self-pay

## 2015-09-15 VITALS — BP 140/88 | HR 84 | Ht 73.0 in | Wt 242.0 lb

## 2015-09-15 DIAGNOSIS — I10 Essential (primary) hypertension: Secondary | ICD-10-CM | POA: Diagnosis not present

## 2015-09-15 DIAGNOSIS — E785 Hyperlipidemia, unspecified: Secondary | ICD-10-CM

## 2015-09-15 DIAGNOSIS — Z720 Tobacco use: Secondary | ICD-10-CM | POA: Diagnosis not present

## 2015-09-15 DIAGNOSIS — E669 Obesity, unspecified: Secondary | ICD-10-CM | POA: Diagnosis not present

## 2015-09-15 NOTE — Patient Instructions (Signed)
Dr Royann Shiversroitoru recommends that you schedule a follow-up appointment in 12 months. You will receive a reminder letter in the mail two months in advance. If you don't receive a letter, please call our office to schedule the follow-up appointment.  If you need a refill on your cardiac medications before your next appointment, please call your pharmacy.   Your physician discussed the hazards of tobacco use. Tobacco use cessation is recommended and techniques and options to help you quit were discussed.  Your physician encouraged you to lose weight for better health. Try to get down to 220 pounds, 34 inch waist.

## 2015-09-15 NOTE — Progress Notes (Signed)
Cardiology Office Note    Date:  09/15/2015   ID:  Brent Nguyen, DOB 06-Jul-1965, MRN [id number]  PCP:  Alice ReichertMCINNIS,ANGUS G, MD  Cardiologist:   Thurmon FairMihai Courvoisier Hamblen, MD   Chief Complaint  Patient presents with  . Follow-up  . Dizziness    blurry vision  . Chest Pain    History of Present Illness:  Brent Nguyen is a 50 y.o. male with hyperlipidemia and systemic hypertension, ongoing smoker who returns for follow-up. He was seen by a primary care provider recently and his blood pressure was high. He was advised to double his dose of antihypertensive medication. However at the time he was having a lot of problems with left-sided sciatica and believes that pain was the reason for his elevated blood pressure.  He has found a new job and seems much more relaxed than he was at his previous employer. His palpitations are not bothering him at all. His blood pressure was borderline high when he checked in today 140/88 mmHg, rechecked 139/87 mmHg. Unfortunately Brent Nguyen has gained 11 pounds in half a year since his last appointment and is now frankly obese with a BMI of about 32. Does not exercise regularly. He eats lunch from fast food stores.  On the other hand he has been able to perform fairly intense physical activity including lifting heavy limbs and using a chainsaw without any cardiac complaints. He had a normal stress test in 2015  He denies angina, dyspnea, intermittent claudication, focal neurological deficits, erectile dysfunction. He does have some swelling in his ankles and some discoloration of his perimalleolar area    Past Medical History:  Diagnosis Date  . Diverticulosis    a. s/p partial colectomy 2005 for anastamotic leak with reversal.  . Hyperlipidemia   . Hypertension   . Obesity   . Tobacco abuse     Past Surgical History:  Procedure Laterality Date  . COLOSTOMY REVERSAL    . KNEE ARTHROSCOPY Right   . KNEE ARTHROSCOPY Right 01/20/2013   Procedure: ARTHROSCOPY KNEE;   Surgeon: Darreld McleanWayne Keeling, MD;  Location: [facility];  Service: Orthopedics;  Laterality: Right;  . MENISECTOMY Right 01/20/2013   Procedure: partial medial menisectomy;  Surgeon: Darreld McleanWayne Keeling, MD;  Location: [facility];  Service: Orthopedics;  Laterality: Right;  . PARTIAL COLECTOMY     2 subsequent surgeries to reverse his colostomy-diverticulosis  . WISDOM TOOTH EXTRACTION      Current Medications: Outpatient Medications Prior to Visit  Medication Sig Dispense Refill  . amLODipine (NORVASC) 5 MG tablet TAKE 1 TABLET (5 MG TOTAL) BY MOUTH DAILY. NEED APPOINTMENT 90 tablet 3  . aspirin EC 81 MG tablet Take 1 tablet (81 mg total) by mouth daily. 90 tablet 3  . carvedilol (COREG) 12.5 MG tablet Take 1 tablet (12.5 mg total) by mouth 2 (two) times daily with a meal. 60 tablet 6   No facility-administered medications prior to visit.      Allergies:   Penicillins   Social History   Social History  . Marital status: Married    Spouse name: N/A  . Number of children: N/A  . Years of education: N/A   Social History Main Topics  . Smoking status: Current Every Day Smoker    Packs/day: 1.00    Years: 25.00    Types: Cigarettes  . Smokeless tobacco: Former NeurosurgeonUser  . Alcohol use Yes     Comment: On occasion, 1 or 2 beers per month  . Drug use:  No  . Sexual activity: Yes    Birth control/ protection: None   Other Topics Concern  . None   Social History Narrative  . None     Family History:  The patient's family history includes ALS in his maternal grandfather; Cancer in his father, maternal grandmother, and paternal grandfather; Celiac disease in his brother and mother; Diabetes in his maternal grandfather; Diverticulosis in his brother, daughter, and mother; Heart attack in his paternal grandmother; Hypertension in his mother and paternal grandmother; Hypotension in his brother; Sleep apnea in his mother; Ulcers in his brother, maternal grandfather, and mother.   ROS:   Please see the  history of present illness.    ROS All other systems reviewed and are negative.   PHYSICAL EXAM:   VS:  BP 140/88   Pulse 84   Ht 6\' 1"  (1.854 m)   Wt 242 lb (109.8 kg)   BMI 31.93 kg/m    GEN: Well nourished, well developed, in no acute distress  HEENT: normal  Neck: no JVD, carotid bruits, or masses Cardiac: RRR; no murmurs, rubs, or gallops, Minimal ankle edema symmetrically, some brownish discoloration of stasis dermatitis Respiratory:  clear to auscultation bilaterally, normal work of breathing GI: soft, nontender, nondistended, + BS MS: no deformity or atrophy  Skin: warm and dry, no rash Neuro:  Alert and Oriented x 3, Strength and sensation are intact Psych: euthymic mood, full affect  Wt Readings from Last 3 Encounters:  09/15/15 242 lb (109.8 kg)  12/07/14 231 lb (104.8 kg)  01/09/14 238 lb (108 kg)      Studies/Labs Reviewed:   EKG:  EKG is ordered today.  The ekg ordered today demonstrates Normal sinus rhythm, normal tracing    ASSESSMENT:    1. Essential hypertension   2. Hyperlipidemia   3. Obesity (BMI 30-39.9)   4. Tobacco abuse      PLAN:  In order of problems listed above:  1. HTN: His blood pressure is borderline high. The elevation in his primary care provider was likely situational and related to pain. He should avoid high salt food (such as sandwiches he is eating from [employer]) and should start to try to lose weight again. Recommended that by his next appointment he is weight is down to 220 pounds and his waistline down to 34 inches. 2. HLP: Recheck at his next appointment, I'm sure his HDL cholesterol will not have improved since he has gained weight. Previously had evidence of low HDL and borderline high LDL. 3. Obesity: Described strategies for weight loss involving both diet and exercise. 4. Tobacco abuse in the past he successfully quit smoking for 6 years. I think he really needs to quit smoking and we talked about this in some detail  today.    Medication Adjustments/Labs and Tests Ordered: Current medicines are reviewed at length with the patient today.  Concerns regarding medicines are outlined above.  Medication changes, Labs and Tests ordered today are listed in the Patient Instructions below. Patient Instructions  Dr Royann Shiversroitoru recommends that you schedule a follow-up appointment in 12 months. You will receive a reminder letter in the mail two months in advance. If you don't receive a letter, please call our office to schedule the follow-up appointment.  If you need a refill on your cardiac medications before your next appointment, please call your pharmacy.   Your physician discussed the hazards of tobacco use. Tobacco use cessation is recommended and techniques and options to help you quit  were discussed.  Your physician encouraged you to lose weight for better health. Try to get down to 220 pounds, 34 inch waist.    Signed, Thurmon Fair, MD  09/15/2015 5:54 PM    [facility] [address] [place], [place], [place]  [address] Phone: [phone]; Fax: [phone]

## 2016-01-10 ENCOUNTER — Other Ambulatory Visit: Payer: Self-pay | Admitting: Cardiovascular Disease

## 2016-02-21 ENCOUNTER — Other Ambulatory Visit (HOSPITAL_COMMUNITY): Payer: Self-pay | Admitting: Internal Medicine

## 2016-02-21 ENCOUNTER — Ambulatory Visit (HOSPITAL_COMMUNITY)
Admission: RE | Admit: 2016-02-21 | Discharge: 2016-02-21 | Disposition: A | Discharge status: Home / Self Care | Payer: BLUE CROSS/BLUE SHIELD | Source: Ambulatory Visit | Attending: Internal Medicine | Admitting: Internal Medicine

## 2016-02-21 DIAGNOSIS — R062 Wheezing: Secondary | ICD-10-CM | POA: Diagnosis not present

## 2016-02-21 DIAGNOSIS — F17210 Nicotine dependence, cigarettes, uncomplicated: Secondary | ICD-10-CM | POA: Insufficient documentation

## 2016-02-22 ENCOUNTER — Telehealth: Payer: Self-pay | Admitting: Cardiovascular Disease

## 2016-02-22 NOTE — Telephone Encounter (Signed)
Spoke with patient and he stated that he was up all night and didn't feel like he needed sleep.  Advised patient this is not uncommon with the Prednisone to contact prescribing provider Patient stated he did not have a lot of confidence in PA that he saw Did explain again this can happen with the Prednisone  He did want to Dr Royann Shiversroitoru to be aware, will forward for review

## 2016-02-22 NOTE — Telephone Encounter (Signed)
New Message      PCP put him on Doxycycline and prednisone and it is reacting with his medication. Has him feeling very agitated and wired

## 2016-02-23 NOTE — Telephone Encounter (Signed)
Yes, pretty typical side effects of prednisone. Should get better within a couple of days of stopping it.

## 2016-02-24 ENCOUNTER — Other Ambulatory Visit (HOSPITAL_COMMUNITY): Payer: Self-pay | Admitting: Internal Medicine

## 2016-02-25 ENCOUNTER — Other Ambulatory Visit (HOSPITAL_COMMUNITY): Payer: Self-pay | Admitting: Internal Medicine

## 2016-02-25 DIAGNOSIS — R109 Unspecified abdominal pain: Secondary | ICD-10-CM

## 2016-02-25 DIAGNOSIS — R1907 Generalized intra-abdominal and pelvic swelling, mass and lump: Secondary | ICD-10-CM

## 2016-03-07 ENCOUNTER — Ambulatory Visit (HOSPITAL_COMMUNITY)
Admission: RE | Admit: 2016-03-07 | Discharge: 2016-03-07 | Disposition: A | Discharge status: Home / Self Care | Payer: BLUE CROSS/BLUE SHIELD | Source: Ambulatory Visit | Attending: Internal Medicine | Admitting: Internal Medicine

## 2016-03-07 DIAGNOSIS — R1907 Generalized intra-abdominal and pelvic swelling, mass and lump: Secondary | ICD-10-CM | POA: Diagnosis present

## 2016-03-07 DIAGNOSIS — I700 Atherosclerosis of aorta: Secondary | ICD-10-CM | POA: Diagnosis not present

## 2016-03-07 DIAGNOSIS — Z9049 Acquired absence of other specified parts of digestive tract: Secondary | ICD-10-CM | POA: Insufficient documentation

## 2016-03-07 DIAGNOSIS — R109 Unspecified abdominal pain: Secondary | ICD-10-CM | POA: Diagnosis present

## 2016-03-07 DIAGNOSIS — K439 Ventral hernia without obstruction or gangrene: Secondary | ICD-10-CM | POA: Insufficient documentation

## 2016-03-07 MED ORDER — IOPAMIDOL (ISOVUE-300) INJECTION 61%
100.0000 mL | Freq: Once | INTRAVENOUS | Status: AC | PRN
  Administered 2016-03-07: 100 mL via INTRAVENOUS

## 2016-04-25 ENCOUNTER — Telehealth: Payer: Self-pay | Admitting: *Deleted

## 2016-04-25 NOTE — Telephone Encounter (Signed)
[facility] sent to scheduling

## 2016-04-27 ENCOUNTER — Other Ambulatory Visit: Payer: Self-pay | Admitting: Internal Medicine

## 2016-04-27 DIAGNOSIS — R0600 Dyspnea, unspecified: Secondary | ICD-10-CM

## 2016-05-03 ENCOUNTER — Encounter (HOSPITAL_COMMUNITY): Payer: Self-pay | Admitting: *Deleted

## 2016-05-03 ENCOUNTER — Emergency Department (HOSPITAL_COMMUNITY): Payer: BLUE CROSS/BLUE SHIELD

## 2016-05-03 ENCOUNTER — Emergency Department (HOSPITAL_COMMUNITY)
Admission: EM | Admit: 2016-05-03 | Discharge: 2016-05-03 | Disposition: A | Discharge status: Home / Self Care | Payer: BLUE CROSS/BLUE SHIELD | Attending: Emergency Medicine | Admitting: Emergency Medicine

## 2016-05-03 DIAGNOSIS — Z79899 Other long term (current) drug therapy: Secondary | ICD-10-CM | POA: Insufficient documentation

## 2016-05-03 DIAGNOSIS — Z7982 Long term (current) use of aspirin: Secondary | ICD-10-CM | POA: Insufficient documentation

## 2016-05-03 DIAGNOSIS — F17210 Nicotine dependence, cigarettes, uncomplicated: Secondary | ICD-10-CM | POA: Insufficient documentation

## 2016-05-03 DIAGNOSIS — I10 Essential (primary) hypertension: Secondary | ICD-10-CM | POA: Insufficient documentation

## 2016-05-03 DIAGNOSIS — R531 Weakness: Secondary | ICD-10-CM | POA: Diagnosis not present

## 2016-05-03 DIAGNOSIS — R202 Paresthesia of skin: Secondary | ICD-10-CM | POA: Insufficient documentation

## 2016-05-03 HISTORY — DX: Sciatica, left side: M54.32

## 2016-05-03 HISTORY — DX: Reserved for inherently not codable concepts without codable children: IMO0001

## 2016-05-03 HISTORY — DX: Other chronic pain: G89.29

## 2016-05-03 HISTORY — DX: Dorsalgia, unspecified: M54.9

## 2016-05-03 LAB — CBC WITH DIFFERENTIAL/PLATELET
BASOS ABS: 0.1 10*3/uL (ref 0.0–0.1)
BASOS PCT: 1 %
Eosinophils Absolute: 0.4 10*3/uL (ref 0.0–0.7)
Eosinophils Relative: 5 %
HEMATOCRIT: 44 % (ref 39.0–52.0)
Hemoglobin: 15.5 g/dL (ref 13.0–17.0)
LYMPHS PCT: 40 %
Lymphs Abs: 2.8 10*3/uL (ref 0.7–4.0)
MCH: 31.3 pg (ref 26.0–34.0)
MCHC: 35.2 g/dL (ref 30.0–36.0)
MCV: 88.9 fL (ref 78.0–100.0)
MONO ABS: 0.5 10*3/uL (ref 0.1–1.0)
Monocytes Relative: 7 %
Neutro Abs: 3.3 10*3/uL (ref 1.7–7.7)
Neutrophils Relative %: 47 %
Platelets: 241 10*3/uL (ref 150–400)
RBC: 4.95 MIL/uL (ref 4.22–5.81)
RDW: 12.9 % (ref 11.5–15.5)
WBC: 7 10*3/uL (ref 4.0–10.5)

## 2016-05-03 LAB — COMPREHENSIVE METABOLIC PANEL
ALT: 23 U/L (ref 17–63)
ANION GAP: 6 (ref 5–15)
AST: 26 U/L (ref 15–41)
Albumin: 4.2 g/dL (ref 3.5–5.0)
Alkaline Phosphatase: 56 U/L (ref 38–126)
BILIRUBIN TOTAL: 0.9 mg/dL (ref 0.3–1.2)
BUN: 12 mg/dL (ref 6–20)
CHLORIDE: 103 mmol/L (ref 101–111)
CO2: 29 mmol/L (ref 22–32)
Calcium: 9.5 mg/dL (ref 8.9–10.3)
Creatinine, Ser: 0.89 mg/dL (ref 0.61–1.24)
Glucose, Bld: 87 mg/dL (ref 65–99)
POTASSIUM: 3.6 mmol/L (ref 3.5–5.1)
Sodium: 138 mmol/L (ref 135–145)
TOTAL PROTEIN: 7.2 g/dL (ref 6.5–8.1)

## 2016-05-03 LAB — URINALYSIS, ROUTINE W REFLEX MICROSCOPIC
BILIRUBIN URINE: NEGATIVE
GLUCOSE, UA: NEGATIVE mg/dL
HGB URINE DIPSTICK: NEGATIVE
KETONES UR: NEGATIVE mg/dL
Leukocytes, UA: NEGATIVE
NITRITE: NEGATIVE
PH: 6 (ref 5.0–8.0)
Protein, ur: NEGATIVE mg/dL
Specific Gravity, Urine: 1.013 (ref 1.005–1.030)

## 2016-05-03 LAB — RAPID URINE DRUG SCREEN, HOSP PERFORMED
Amphetamines: NOT DETECTED
BARBITURATES: NOT DETECTED
Benzodiazepines: NOT DETECTED
Cocaine: NOT DETECTED
Opiates: NOT DETECTED
Tetrahydrocannabinol: POSITIVE — AB

## 2016-05-03 LAB — TROPONIN I

## 2016-05-03 LAB — ETHANOL: Alcohol, Ethyl (B): <5 mg/dL (ref <5)

## 2016-05-03 NOTE — ED Triage Notes (Signed)
Pt c/o weakness and swelling in bilateral arms and legs since Monday.

## 2016-05-03 NOTE — ED Provider Notes (Signed)
[facility] Provider Note   CSN: [id number] Arrival date & time: 05/03/16  1133     History   Chief Complaint Chief Complaint  Patient presents with  . Weakness    HPI Brent Nguyen is a 51 y.o. male.  HPI  Pt was seen at 1250. Per pt, c/o gradual onset and persistence of constant bilat hands and legs "weakness" since waking up yesterday morning. Pt states he has been "unable to hold onto things" and "can't lift my feet up when I'm walking." Pt states he also feels like his "hands and forearms" and "bilat lower legs" are "swollen." Endorses long hx of chronic back pain with sciatic nerve pain down his left leg. Denies any change in his usual LBP. Denies injury, no incont/retention of bowel or bladder, no saddle anesthesia, no fevers, no injury, no abd pain, no neck pain. Pt also c/o gradual onset and resolution of 2 separate episodes of chest pain yesterday morning and this morning. Describes the CP as "sharp" and located in his lower left chest. Episodes lasted <5 min each. Denies SOB/cough, no palpitations.   Past Medical History:  Diagnosis Date  . Chronic back pain   . Diverticulosis    a. s/p partial colectomy 2005 for anastamotic leak with reversal.  . Hyperlipidemia   . Hypertension   . Normal cardiac stress test 2015   low risk  . Obesity   . Sciatica, left side   . Tobacco abuse     Patient Active Problem List   Diagnosis Date Noted  . Chest pain 05/17/2013  . Palpitations 05/17/2013  . Hyperlipidemia 04/25/2013  . Diverticulosis s/p partial colectomy with reversal   . HTN (hypertension), severe 09/17/2012  . Tobacco abuse 09/17/2012  . Obesity (BMI 30-39.9) 09/17/2012    Past Surgical History:  Procedure Laterality Date  . COLOSTOMY REVERSAL    . KNEE ARTHROSCOPY Right   . KNEE ARTHROSCOPY Right 01/20/2013   Procedure: ARTHROSCOPY KNEE;  Surgeon: Darreld Mclean, MD;  Location: [facility];  Service: Orthopedics;  Laterality: Right;  . MENISECTOMY Right  01/20/2013   Procedure: partial medial menisectomy;  Surgeon: Darreld Mclean, MD;  Location: [facility];  Service: Orthopedics;  Laterality: Right;  . PARTIAL COLECTOMY     2 subsequent surgeries to reverse his colostomy-diverticulosis  . WISDOM TOOTH EXTRACTION        Home Medications    Prior to Admission medications   Medication Sig Start Date End Date Taking? Authorizing Provider  amLODipine (NORVASC) 5 MG tablet Take 1 tablet (5 mg total) by mouth daily. 01/10/16  Yes Mihai Croitoru, MD  aspirin EC 81 MG tablet Take 1 tablet (81 mg total) by mouth daily. 04/25/13  Yes Chrystie Nose, MD  carvedilol (COREG) 12.5 MG tablet Take 1 tablet (12.5 mg total) by mouth 2 (two) times daily with a meal. 08/02/15  Yes Thurmon Fair, MD    Family History Family History  Problem Relation Age of Onset  . Hypertension Mother   . Diverticulosis Mother   . Sleep apnea Mother   . Celiac disease Mother   . Ulcers Mother   . Cancer Father     Brain  . Diverticulosis Brother   . Ulcers Brother   . Celiac disease Brother   . Hypotension Brother   . Cancer Maternal Grandmother     Breast  . Ulcers Maternal Grandfather   . ALS Maternal Grandfather   . Diabetes Maternal Grandfather   . Heart attack Paternal  Grandmother   . Hypertension Paternal Grandmother   . Cancer Paternal Grandfather     Melanoma  . Diverticulosis Daughter     Social History Social History  Substance Use Topics  . Smoking status: Current Every Day Smoker    Packs/day: 1.00    Years: 25.00    Types: Cigarettes  . Smokeless tobacco: Former Neurosurgeon  . Alcohol use Yes     Comment: On occasion, 1 or 2 beers per month     Allergies   Penicillins   Review of Systems Review of Systems  ROS: Statement: All systems negative except as marked or noted in the HPI; Constitutional: Negative for fever and chills. ; ; Eyes: Negative for eye pain, redness and discharge. ; ; ENMT: Negative for ear pain, hoarseness, nasal congestion,  sinus pressure and sore throat. ; ; Cardiovascular: Negative for chest pain, palpitations, diaphoresis, dyspnea and peripheral edema. ; ; Respiratory: Negative for cough, wheezing and stridor. ; ; Gastrointestinal: Negative for nausea, vomiting, diarrhea, abdominal pain, blood in stool, hematemesis, jaundice and rectal bleeding. . ; ; Genitourinary: Negative for dysuria, flank pain and hematuria. ; ; Musculoskeletal: Negative for back pain and neck pain. Negative for swelling and trauma.; ; Skin: Negative for pruritus, rash, abrasions, blisters, bruising and skin lesion.; ; Neuro: +extremity weakness, gait difficulty, paresthesias. Negative for headache, lightheadedness and neck stiffness. Negative for altered level of consciousness, altered mental status, involuntary movement, seizure and syncope.       Physical Exam Updated Vital Signs BP (!) 148/108 (BP Location: Right Arm)   Pulse 81   Temp 98.2 F (36.8 C) (Oral)   Resp 16   Ht 6\' 1"  (1.854 m)   Wt 243 lb (110.2 kg)   SpO2 96%   BMI 32.06 kg/m   Physical Exam 1255: Physical examination:  Nursing notes reviewed; Vital signs and O2 SAT reviewed;  Constitutional: Well developed, Well nourished, Well hydrated, In no acute distress; Head:  Normocephalic, atraumatic; Eyes: EOMI, PERRL, No scleral icterus; ENMT: Mouth and pharynx normal, Mucous membranes moist; Neck: Supple, Full range of motion, No lymphadenopathy; Cardiovascular: Regular rate and rhythm, No gallop; Respiratory: Breath sounds clear & equal bilaterally, No wheezes.  Speaking full sentences with ease, Normal respiratory effort/excursion; Chest: Nontender, Movement normal; Abdomen: Soft, Nontender, Nondistended, Normal bowel sounds; Genitourinary: No CVA tenderness; Spine:  No midline CS, TS, LS tenderness.;; Extremities: Pulses normal, No tenderness, No edema, No calf edema or asymmetry.; Neuro: AA&Ox3, Major CN grossly intact. Speech clear.  No facial droop. Grips equal. Strength  5/5 equal bilat UE's and LE's.  DTR 2/4 equal bilat UE's and LE's.  +mild subjective LUE and LLE decreased sensation, otherwise gross sensory deficits.  Normal cerebellar testing bilat UE's (finger-nose) and LE's (heel-shin).  Climbs on and off stretcher easily by himself. Gait steady..; Skin: Color normal, Warm, Dry.     ED Treatments / Results  Labs (all labs ordered are listed, but only abnormal results are displayed)   EKG  EKG Interpretation  Date/Time:  Wednesday May 03 2016 13:06:05 EDT Ventricular Rate:  77 PR Interval:    QRS Duration: 92 QT Interval:  381 QTC Calculation: 432 R Axis:   -7 Text Interpretation:  Sinus rhythm Abnormal R-wave progression, early transition When compared with ECG of 09/15/2015 No significant change was found Confirmed by [facility]  MD, Hason Ofarrell [phone]) on 05/03/2016 1:12:43 PM        EKG Interpretation  Date/Time:  Wednesday May 03 2016 60:45:40 EDT Ventricular  Rate:  77 PR Interval:    QRS Duration: 93 QT Interval:  382 QTC Calculation: 433 R Axis:   -6 Text Interpretation:  Sinus arrhythmia Abnormal R-wave progression, early transition Since last tracing of earlier today No significant change was found Confirmed by [facility]  MD, Nicholos Johns [phone]) on 05/03/2016 4:37:59 PM         Radiology   Procedures Procedures (including critical care time)  Medications Ordered in ED Medications - No data to display   Initial Impression / Assessment and Plan / ED Course  I have reviewed the triage vital signs and the nursing notes.  Pertinent labs & imaging results that were available during my care of the patient were reviewed by me and considered in my medical decision making (see chart for details).  MDM Reviewed: previous chart, nursing note and vitals Reviewed previous: labs and ECG Interpretation: labs, ECG and MRI    Results for orders placed or performed during the hospital encounter of 05/03/16  Comprehensive metabolic panel    Result Value Ref Range   Sodium 138 135 - 145 mmol/L   Potassium 3.6 3.5 - 5.1 mmol/L   Chloride 103 101 - 111 mmol/L   CO2 29 22 - 32 mmol/L   Glucose, Bld 87 65 - 99 mg/dL   BUN 12 6 - 20 mg/dL   Creatinine, Ser 6.04 0.61 - 1.24 mg/dL   Calcium 9.5 8.9 - 54.0 mg/dL   Total Protein 7.2 6.5 - 8.1 g/dL   Albumin 4.2 3.5 - 5.0 g/dL   AST 26 15 - 41 U/L   ALT 23 17 - 63 U/L   Alkaline Phosphatase 56 38 - 126 U/L   Total Bilirubin 0.9 0.3 - 1.2 mg/dL   GFR calc non Af Amer >60 >60 mL/min   GFR calc Af Amer >60 >60 mL/min   Anion gap 6 5 - 15  Troponin I  Result Value Ref Range   Troponin I <0.03 <0.03 ng/mL  CBC with Differential  Result Value Ref Range   WBC 7.0 4.0 - 10.5 K/uL   RBC 4.95 4.22 - 5.81 MIL/uL   Hemoglobin 15.5 13.0 - 17.0 g/dL   HCT 98.1 19.1 - 47.8 %   MCV 88.9 78.0 - 100.0 fL   MCH 31.3 26.0 - 34.0 pg   MCHC 35.2 30.0 - 36.0 g/dL   RDW 29.5 62.1 - 30.8 %   Platelets 241 150 - 400 K/uL   Neutrophils Relative % 47 %   Neutro Abs 3.3 1.7 - 7.7 K/uL   Lymphocytes Relative 40 %   Lymphs Abs 2.8 0.7 - 4.0 K/uL   Monocytes Relative 7 %   Monocytes Absolute 0.5 0.1 - 1.0 K/uL   Eosinophils Relative 5 %   Eosinophils Absolute 0.4 0.0 - 0.7 K/uL   Basophils Relative 1 %   Basophils Absolute 0.1 0.0 - 0.1 K/uL  Urinalysis, Routine w reflex microscopic  Result Value Ref Range   Color, Urine YELLOW YELLOW   APPearance HAZY (A) CLEAR   Specific Gravity, Urine 1.013 1.005 - 1.030   pH 6.0 5.0 - 8.0   Glucose, UA NEGATIVE NEGATIVE mg/dL   Hgb urine dipstick NEGATIVE NEGATIVE   Bilirubin Urine NEGATIVE NEGATIVE   Ketones, ur NEGATIVE NEGATIVE mg/dL   Protein, ur NEGATIVE NEGATIVE mg/dL   Nitrite NEGATIVE NEGATIVE   Leukocytes, UA NEGATIVE NEGATIVE  Urine rapid drug screen (hosp performed)  Result Value Ref Range   Opiates NONE DETECTED NONE DETECTED  Cocaine NONE DETECTED NONE DETECTED   Benzodiazepines NONE DETECTED NONE DETECTED   Amphetamines NONE  DETECTED NONE DETECTED   Tetrahydrocannabinol POSITIVE (A) NONE DETECTED   Barbiturates NONE DETECTED NONE DETECTED  Ethanol  Result Value Ref Range   Alcohol, Ethyl (B) <5 <5 mg/dL  Troponin I  Result Value Ref Range   Troponin I <0.03 <0.03 ng/mL   Mr Brain Wo Contrast (neuro Protocol) Result Date: 05/03/2016 CLINICAL DATA:  BILATERAL arm and leg weakness for 1 day.  Or EXAM: MRI HEAD WITHOUT CONTRAST MRI CERVICAL SPINE WITHOUT CONTRAST TECHNIQUE: Multiplanar, multiecho pulse sequences of the brain and surrounding structures, and cervical spine, to include the craniocervical junction and cervicothoracic junction, were obtained without intravenous contrast. COMPARISON:  None. FINDINGS: MRI HEAD FINDINGS Brain: No evidence for acute infarction, hemorrhage, mass lesion, hydrocephalus, or extra-axial fluid. Mild atrophy. T2 and FLAIR hyperintensities throughout the white matter suggests small vessel disease. Vascular: Flow voids are maintained throughout the carotid, basilar, and vertebral arteries. There are no areas of chronic hemorrhage. Skull and upper cervical spine: Unremarkable visualized calvarium, skullbase, and cervical vertebrae. Pituitary, pineal, cerebellar tonsils unremarkable. No upper cervical cord lesions. Sinuses/Orbits: Large retention cyst RIGHT maxillary sinus. BILATERAL chronic ethmoid sinus disease. Mild LEFT frontal sinus mucosal thickening. Negative orbits. Other: Unremarkable.  Trace fluid on the RIGHT. MRI CERVICAL SPINE FINDINGS Alignment: Physiologic. Vertebrae: No fracture, evidence of discitis, or bone lesion. Cord: Normal signal and morphology. No intraspinal mass lesion. Craniocervical junction unremarkable. Posterior Fossa, vertebral arteries, paraspinal tissues: Unremarkable. Disc levels: The C2-3, C3-4, C4-5, and C5-6 disc spaces show no significant protrusion or compressive phenomenon. At C6-7, there is a shallow central and leftward protrusion without impingement. The  C7-T1 level is normal. IMPRESSION: No acute intracranial findings. Specifically, no cause seen for BILATERAL arm and leg weakness. Minor cervical spondylosis. Shallow protrusion at C6-7 is noncompressive. No abnormal cord signal or intraspinal mass lesion. Electronically Signed   By: Elsie Stain M.D.   On: 05/03/2016 14:50   Mr Cervical Spine Wo Contrast Result Date: 05/03/2016 CLINICAL DATA:  BILATERAL arm and leg weakness for 1 day.  Or EXAM: MRI HEAD WITHOUT CONTRAST MRI CERVICAL SPINE WITHOUT CONTRAST TECHNIQUE: Multiplanar, multiecho pulse sequences of the brain and surrounding structures, and cervical spine, to include the craniocervical junction and cervicothoracic junction, were obtained without intravenous contrast. COMPARISON:  None. FINDINGS: MRI HEAD FINDINGS Brain: No evidence for acute infarction, hemorrhage, mass lesion, hydrocephalus, or extra-axial fluid. Mild atrophy. T2 and FLAIR hyperintensities throughout the white matter suggests small vessel disease. Vascular: Flow voids are maintained throughout the carotid, basilar, and vertebral arteries. There are no areas of chronic hemorrhage. Skull and upper cervical spine: Unremarkable visualized calvarium, skullbase, and cervical vertebrae. Pituitary, pineal, cerebellar tonsils unremarkable. No upper cervical cord lesions. Sinuses/Orbits: Large retention cyst RIGHT maxillary sinus. BILATERAL chronic ethmoid sinus disease. Mild LEFT frontal sinus mucosal thickening. Negative orbits. Other: Unremarkable.  Trace fluid on the RIGHT. MRI CERVICAL SPINE FINDINGS Alignment: Physiologic. Vertebrae: No fracture, evidence of discitis, or bone lesion. Cord: Normal signal and morphology. No intraspinal mass lesion. Craniocervical junction unremarkable. Posterior Fossa, vertebral arteries, paraspinal tissues: Unremarkable. Disc levels: The C2-3, C3-4, C4-5, and C5-6 disc spaces show no significant protrusion or compressive phenomenon. At C6-7, there is a  shallow central and leftward protrusion without impingement. The C7-T1 level is normal. IMPRESSION: No acute intracranial findings. Specifically, no cause seen for BILATERAL arm and leg weakness. Minor cervical spondylosis. Shallow protrusion at C6-7 is noncompressive. No  abnormal cord signal or intraspinal mass lesion. Electronically Signed   By: Elsie Stain M.D.   On: 05/03/2016 14:50     1315:  Concern for stroke or cord issue as cause for symptoms. T/C to Neuro Dr. Gerilyn Pilgrim, case discussed, including:  HPI, pertinent PM/SHx, VS/PE, dx testing, ED course and treatment: suggests best imaging study to obtain will be MRI brain and CS (no need to image entire cord).     1325:  T/C to Rads MD Gallerani regarding 03/07/2016, CT abd w/contrast (LS findings in particular) vs 06/13/2012 MRI LS:  No significant changes in LS on CT scan from previous MRI (bilat pars defects L5-S1, foramenal stenosis, small disc bulges).    1545:  T/C to Neuro Dr. Gerilyn Pilgrim, case discussed, including:  HPI, pertinent PM/SHx, VS/PE, dx testing, ED course and treatment, including T/C with Rads MD (above):  Agrees with ED testing, no further testing needed in ED, f/u ofc.   1800:  Workup reassuring. Doubt PE as cause for symptoms with low risk [place].  Doubt ACS as cause for symptoms with normal troponin x2 and unchanged EKG from previous after 2 days of atypical symptoms. Pt has been ambulatory with steady gait. Neuro exam remains unchanged. Pt states he wants to go home now. No clear indication for admission at this time. Dx and testing d/w pt and family.  Questions answered.  Verb understanding, agreeable to d/c home with outpt f/u.      Final Clinical Impressions(s) / ED Diagnoses   Final diagnoses:  None    New Prescriptions New Prescriptions   No medications on file     Samuel Jester, DO 05/06/16 2348

## 2016-05-03 NOTE — ED Notes (Signed)
Pt ambulated to BR without any difficultly 

## 2016-05-03 NOTE — Discharge Instructions (Signed)
Take your usual prescriptions as previously directed.  Call your regular medical doctor and the Neurologist tomorrow to schedule a follow up appointment within the next 3 days.  Return to the Emergency Department immediately sooner if worsening.  ° °

## 2016-05-03 NOTE — ED Notes (Signed)
ED Provider at bedside. 

## 2016-05-11 ENCOUNTER — Other Ambulatory Visit: Payer: Self-pay

## 2016-05-11 ENCOUNTER — Ambulatory Visit (HOSPITAL_COMMUNITY): Payer: BLUE CROSS/BLUE SHIELD | Attending: Internal Medicine

## 2016-05-11 DIAGNOSIS — R0600 Dyspnea, unspecified: Secondary | ICD-10-CM | POA: Insufficient documentation

## 2016-05-11 DIAGNOSIS — I340 Nonrheumatic mitral (valve) insufficiency: Secondary | ICD-10-CM | POA: Insufficient documentation

## 2016-08-14 ENCOUNTER — Other Ambulatory Visit: Payer: Self-pay | Admitting: Cardiovascular Disease

## 2016-08-18 ENCOUNTER — Other Ambulatory Visit: Payer: Self-pay | Admitting: Cardiovascular Disease

## 2016-08-25 ENCOUNTER — Encounter: Payer: Self-pay | Admitting: Cardiovascular Disease

## 2016-08-25 ENCOUNTER — Ambulatory Visit (INDEPENDENT_AMBULATORY_CARE_PROVIDER_SITE_OTHER): Payer: BLUE CROSS/BLUE SHIELD | Admitting: Cardiovascular Disease

## 2016-08-25 VITALS — BP 131/87 | HR 74 | Ht 73.0 in | Wt 236.0 lb

## 2016-08-25 DIAGNOSIS — E782 Mixed hyperlipidemia: Secondary | ICD-10-CM | POA: Diagnosis not present

## 2016-08-25 DIAGNOSIS — Z720 Tobacco use: Secondary | ICD-10-CM

## 2016-08-25 DIAGNOSIS — I10 Essential (primary) hypertension: Secondary | ICD-10-CM | POA: Diagnosis not present

## 2016-08-25 DIAGNOSIS — E669 Obesity, unspecified: Secondary | ICD-10-CM

## 2016-08-25 NOTE — Patient Instructions (Signed)
Dr Croitoru recommends that you schedule a follow-up appointment in 12 months. You will receive a reminder letter in the mail two months in advance. If you don't receive a letter, please call our office to schedule the follow-up appointment.  If you need a refill on your cardiac medications before your next appointment, please call your pharmacy. 

## 2016-08-25 NOTE — Progress Notes (Signed)
Cardiology Office Note    Date:  08/25/2016   ID:  Brent Nguyen, DOB July 24, 1965, MRN [id number]  PCP:  Brent GrippeKim, James, MD  Cardiologist:   Brent FairMihai Madonna Flegal, MD   Chief Complaint  Patient presents with  . Follow-up    pt denied chest pain and SOB    History of Present Illness:  Brent Nguyen is a 51 y.o. male with hyperlipidemia and systemic hypertension, ongoing smoker who returns for follow-up.   Su HiltRoberts been 10 pounds. His home scale shows 226 pounds which now puts him in the overweight rather than obese range. 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 He has managed to lose about 11 pounds since last year. He has built-in aboveground pool and is starting a deck around it. He can do heavy physical labor without angina, dyspnea, dizziness, syncope, palpitations. He does have some ankle swelling towards the end of the day that resolves once he takes off his boots and socks.  Recently checked at home his blood pressure was 122/79 mmHg. Diastolic blood pressure was a little high when first checked it today, but was improving later during the interview.   Past Medical History:  Diagnosis Date  . Chronic back pain   . Diverticulosis    a. s/p partial colectomy 2005 for anastamotic leak with reversal.  . Hyperlipidemia   . Hypertension   . Normal cardiac stress test 2015   low risk  . Obesity   . Sciatica, left side   . Tobacco abuse     Past Surgical History:  Procedure Laterality Date  . COLOSTOMY REVERSAL    . KNEE ARTHROSCOPY Right   . KNEE ARTHROSCOPY Right 01/20/2013   Procedure: ARTHROSCOPY KNEE;  Surgeon: Brent McleanWayne Keeling, MD;  Location: [facility];  Service: Orthopedics;  Laterality: Right;  . MENISECTOMY Right 01/20/2013   Procedure: partial medial  menisectomy;  Surgeon: Brent McleanWayne Keeling, MD;  Location: [facility];  Service: Orthopedics;  Laterality: Right;  . PARTIAL COLECTOMY     2 subsequent surgeries to reverse his colostomy-diverticulosis  . WISDOM TOOTH EXTRACTION      Current Medications: Outpatient Medications Prior to Visit  Medication Sig Dispense Refill  . amLODipine (NORVASC) 5 MG tablet TAKE 1 TABLET BY MOUTH DAILY 30 tablet 0  . carvedilol (COREG) 12.5 MG tablet TAKE 1 TABLET (12.5 MG TOTAL) BY MOUTH 2 (TWO) TIMES DAILY WITH A MEAL. 60 tablet 1  . aspirin EC 81 MG tablet Take 1 tablet (81 mg total) by mouth daily. 90 tablet 3   No facility-administered medications prior to visit.      Allergies:   Penicillins   Social History   Social History  . Marital status: Married    Spouse name: N/A  . Number of children: N/A  . Years of education: N/A   Social History Main Topics  . Smoking status: Current Every Day Smoker    Packs/day: 1.00    Years: 25.00    Types: Cigarettes  . Smokeless tobacco: Former NeurosurgeonUser  . Alcohol use Yes     Comment: On occasion, 1 or 2 beers per month  . Drug use: No  . Sexual activity: Yes    Birth control/ protection: None   Other Topics Concern  .  None   Social History Narrative  . None     Family History:  The patient's family history includes ALS in his maternal grandfather; Cancer in his father, maternal grandmother, and paternal grandfather; Celiac disease in his brother and mother; Diabetes in his maternal grandfather; Diverticulosis in his brother, daughter, and mother; Heart attack in his paternal grandmother; Hypertension in his mother and paternal grandmother; Hypotension in his brother; Sleep apnea in his mother; Ulcers in his brother, maternal grandfather, and mother.   ROS:   Please see the history of present illness.    ROS All other systems reviewed and are negative.   PHYSICAL EXAM:   VS:  BP 131/87   Pulse 74   Ht 6\' 1"  (1.854 m)   Wt 236 lb (107 kg)   BMI 31.14  kg/m     He is wearing heavy boots and work clothes. Weight at home is 226 pounds Recheck blood pressure 131/87 mmHg  GEN: Well nourished, well developed, in no acute distress  HEENT: normal  Neck: no JVD, carotid bruits, or masses Cardiac: RRR; no murmurs, rubs, or gallops, Minimal ankle edema symmetrically, some brownish discoloration of stasis dermatitis Respiratory:  clear to auscultation bilaterally, normal work of breathing GI: soft, nontender, nondistended, + BS MS: no deformity or atrophy  Skin: warm and dry, no rash Neuro:  Alert and Oriented x 3, Strength and sensation are intact Psych: euthymic mood, full affect  Wt Readings from Last 3 Encounters:  08/25/16 236 lb (107 kg)  05/03/16 243 lb (110.2 kg)  09/15/15 242 lb (109.8 kg)      Studies/Labs Reviewed:   EKG:  EKG is not ordered today.     ASSESSMENT:    1. Essential hypertension   2. Mixed hyperlipidemia   3. Obesity (BMI 30-39.9)   4. Tobacco abuse      PLAN:  In order of problems listed above:  1. HTN: His blood pressure is borderline high Today, but at home. No change in medications. Continue efforts to lose weight. Ideally he would exercise regularly. 2. HLP: Recheck the profile at his next appointment, after additional weight loss. 3. Obesity: Described strategies for weight loss involving both diet and exercise. 4. Tobacco abuse: He expresses some interest in smoking cessation, but not ready to commit today. He wants to find a new job first.    Medication Adjustments/Labs and Tests Ordered: Current medicines are reviewed at length with the patient today.  Concerns regarding medicines are outlined above.  Medication changes, Labs and Tests ordered today are listed in the Patient Instructions below. Patient Instructions  Dr Brent Nguyen recommends that you schedule a follow-up appointment in 12 months. You will receive a reminder letter in the mail two months in advance. If you don't receive a  letter, please call our office to schedule the follow-up appointment.  If you need a refill on your cardiac medications before your next appointment, please call your pharmacy.    Signed, Brent FairMihai Channa Hazelett, MD  08/25/2016 11:11 AM    [facility] [address], [place], [place]  [id number] Phone: [phone]; Fax: [phone]

## 2016-09-11 ENCOUNTER — Other Ambulatory Visit: Payer: Self-pay | Admitting: Cardiovascular Disease

## 2017-01-01 ENCOUNTER — Telehealth: Payer: Self-pay | Admitting: Cardiovascular Disease

## 2017-01-01 NOTE — Telephone Encounter (Signed)
New message   Pt is on carvedilol and he said that the recall is causing cancer and that he want to know if   He should stop taking it, his last dose was today   Pt c/o medication issue:  1. Name of Medication: carvedilol   2. How are you currently taking this medication (dosage and times per day)? Once   3. Are you having a reaction (difficulty breathing--STAT)? No  4. What is your medication issue? recall

## 2017-01-01 NOTE — Telephone Encounter (Signed)
Returned the call to the patient. He was calling about the Amlodipine recall. He has been advised that as of right now Amlodipine is not on recall. It is the combination Amlodipine/Vasartan. He verbalized his understanding.

## 2017-01-14 ENCOUNTER — Other Ambulatory Visit: Payer: Self-pay | Admitting: Cardiovascular Disease

## 2017-01-15 NOTE — Telephone Encounter (Signed)
REFILL 

## 2017-04-26 ENCOUNTER — Other Ambulatory Visit: Payer: Self-pay

## 2017-04-26 MED ORDER — CARVEDILOL 12.5 MG PO TABS
12.5000 mg | ORAL_TABLET | Freq: Two times a day (BID) | ORAL | 2 refills | Status: DC
Start: 2017-04-26 — End: 2017-09-24

## 2017-04-26 MED ORDER — AMLODIPINE BESYLATE 5 MG PO TABS: 5.0000 mg | ORAL_TABLET | Freq: Every day | ORAL | 2 refills | Status: DC

## 2017-04-26 NOTE — Telephone Encounter (Signed)
Received call from patient that he needed an apptointment in order to receive refills on his meds, scheduled appt and sent refills to pharmacy.

## 2017-07-02 ENCOUNTER — Encounter: Payer: Self-pay | Admitting: *Deleted

## 2017-08-24 ENCOUNTER — Ambulatory Visit: Payer: BLUE CROSS/BLUE SHIELD | Admitting: Cardiovascular Disease

## 2017-09-24 ENCOUNTER — Encounter: Payer: Self-pay | Admitting: Cardiovascular Disease

## 2017-09-24 ENCOUNTER — Ambulatory Visit (INDEPENDENT_AMBULATORY_CARE_PROVIDER_SITE_OTHER): Payer: 59 | Admitting: Cardiovascular Disease

## 2017-09-24 VITALS — BP 131/81 | HR 72 | Ht 73.0 in | Wt 238.0 lb

## 2017-09-24 DIAGNOSIS — Z720 Tobacco use: Secondary | ICD-10-CM

## 2017-09-24 DIAGNOSIS — I10 Essential (primary) hypertension: Secondary | ICD-10-CM | POA: Diagnosis not present

## 2017-09-24 DIAGNOSIS — E785 Hyperlipidemia, unspecified: Secondary | ICD-10-CM

## 2017-09-24 DIAGNOSIS — Z79899 Other long term (current) drug therapy: Secondary | ICD-10-CM

## 2017-09-24 DIAGNOSIS — E669 Obesity, unspecified: Secondary | ICD-10-CM | POA: Diagnosis not present

## 2017-09-24 MED ORDER — AMLODIPINE BESYLATE 5 MG PO TABS: 5.0000 mg | ORAL_TABLET | Freq: Every day | ORAL | 3 refills | Status: DC

## 2017-09-24 MED ORDER — CARVEDILOL 12.5 MG PO TABS: 12.5000 mg | ORAL_TABLET | Freq: Two times a day (BID) | ORAL | 3 refills | Status: DC

## 2017-09-24 NOTE — Patient Instructions (Signed)
Dr Royann Shiversroitoru recommends that you schedule a follow-up appointment in 12 months. You will receive a reminder letter in the mail two months in advance. If you don't receive a letter, please call our office to schedule the follow-up appointment.  If you need a refill on your cardiac medications before your next appointment, please call your pharmacy.  Your physician recommends that you return for lab work at your convenience - FASTING.

## 2017-09-24 NOTE — Progress Notes (Signed)
Cardiology Office Note    Date:  09/24/2017   ID:  [name], DOB May 21, 1965, MRN [id number]  PCP:  Pearson Grippe, MD  Cardiologist:   Thurmon Fair, MD   Chief Complaint  Patient presents with  . Follow-up    1 year.  Marland Kitchen Headache    Last week.  . Shortness of Breath    Last week.  . Edema    Right leg has a lump.    History of Present Illness:  Brent Nguyen is a 52 y.o. male with hyperlipidemia and systemic hypertension, ongoing smoker who returns for follow-up.   Karol has a new job, but is again unhappy.  He continues to smoke roughly a pack of cigarettes a day.  He has gained back weight and is firmly in the obese category again.  He is not exercising regularly.  He remains asymptomatic and is able to perform very heavy physical activity without complaints.  The patient specifically denies any chest pain at rest exertion, dyspnea at rest or with exertion, orthopnea, paroxysmal nocturnal dyspnea, syncope, palpitations, focal neurological deficits, intermittent claudication, lower extremity edema (rarely see some) from his socks at the end of the long day), unexplained weight gain, cough, hemoptysis or wheezing.  As before, his blood pressure is always higher when he first checks into the office but drops substantially after he relaxes for a while.  Past Medical History:  Diagnosis Date  . Chronic back pain   . Diverticulosis    a. s/p partial colectomy 2005 for anastamotic leak with reversal.  . Hyperlipidemia   . Hypertension   . Normal cardiac stress test 2015   low risk  . Obesity   . Sciatica, left side   . Tobacco abuse     Past Surgical History:  Procedure Laterality Date  . COLOSTOMY REVERSAL    . KNEE ARTHROSCOPY Right   . KNEE ARTHROSCOPY Right 01/20/2013   Procedure: ARTHROSCOPY KNEE;  Surgeon: Darreld Mclean, MD;  Location: [facility];  Service: Orthopedics;  Laterality: Right;  . MENISECTOMY Right 01/20/2013   Procedure: partial medial menisectomy;   Surgeon: Darreld Mclean, MD;  Location: [facility];  Service: Orthopedics;  Laterality: Right;  . PARTIAL COLECTOMY     2 subsequent surgeries to reverse his colostomy-diverticulosis  . WISDOM TOOTH EXTRACTION      Current Medications: Outpatient Medications Prior to Visit  Medication Sig Dispense Refill  . amLODipine (NORVASC) 5 MG tablet Take 1 tablet (5 mg total) by mouth daily. 90 tablet 2  . carvedilol (COREG) 12.5 MG tablet Take 1 tablet (12.5 mg total) by mouth 2 (two) times daily with a meal. (Patient taking differently: Take 12.5 mg by mouth daily. ) 180 tablet 2   No facility-administered medications prior to visit.      Allergies:   Penicillins   Social History   Socioeconomic History  . Marital status: Married    Spouse name: Not on file  . Number of children: Not on file  . Years of education: Not on file  . Highest education level: Not on file  Occupational History  . Not on file  Social Needs  . Financial resource strain: Not on file  . Food insecurity:    Worry: Not on file    Inability: Not on file  . Transportation needs:    Medical: Not on file    Non-medical: Not on file  Tobacco Use  . Smoking status: Current Every Day Smoker  Packs/day: 1.00    Years: 25.00    Pack years: 25.00    Types: Cigarettes  . Smokeless tobacco: Former Engineer, water and Sexual Activity  . Alcohol use: Yes    Comment: On occasion, 1 or 2 beers per month  . Drug use: No  . Sexual activity: Yes    Birth control/protection: None  Lifestyle  . Physical activity:    Days per week: Not on file    Minutes per session: Not on file  . Stress: Not on file  Relationships  . Social connections:    Talks on phone: Not on file    Gets together: Not on file    Attends religious service: Not on file    Active member of club or organization: Not on file    Attends meetings of clubs or organizations: Not on file    Relationship status: Not on file  Other Topics Concern  . Not on  file  Social History Narrative  . Not on file     Family History:  The patient's family history includes ALS in his maternal grandfather; Cancer in his father, maternal grandmother, and paternal grandfather; Celiac disease in his brother and mother; Diabetes in his maternal grandfather; Diverticulosis in his brother, daughter, and mother; Heart attack in his paternal grandmother; Hypertension in his mother and paternal grandmother; Hypotension in his brother; Sleep apnea in his mother; Ulcers in his brother, maternal grandfather, and mother.   ROS:   Please see the history of present illness.    ROS all other systems are reviewed are negative   PHYSICAL EXAM:   VS:  BP 138/84 (BP Location: Left Arm, Patient Position: Sitting, Cuff Size: Normal)   Pulse 72   Ht 6\' 1"  (1.854 m)   Wt 238 lb (108 kg)   BMI 31.40 kg/m     General: Alert, oriented x3, no distress, obese Head: no evidence of trauma, PERRL, EOMI, no exophtalmos or lid lag, no myxedema, no xanthelasma; normal ears, nose and oropharynx Neck: normal jugular venous pulsations and no hepatojugular reflux; brisk carotid pulses without delay and no carotid bruits Chest: clear to auscultation, no signs of consolidation by percussion or palpation, normal fremitus, symmetrical and full respiratory excursions Cardiovascular: normal position and quality of the apical impulse, regular rhythm, normal first and second heart sounds, no murmurs, rubs or gallops Abdomen: no tenderness or distention, no masses by palpation, no abnormal pulsatility or arterial bruits, normal bowel sounds, no hepatosplenomegaly Extremities: no clubbing, cyanosis or edema; 2+ radial, ulnar and brachial pulses bilaterally; 2+ right femoral, posterior tibial and dorsalis pedis pulses; 2+ left femoral, posterior tibial and dorsalis pedis pulses; no subclavian or femoral bruits Neurological: grossly nonfocal Psych: Normal mood and affect   Wt Readings from Last 3  Encounters:  09/24/17 238 lb (108 kg)  08/25/16 236 lb (107 kg)  05/03/16 243 lb (110.2 kg)      Studies/Labs Reviewed:   EKG:  EKG is not ordered today.   Lipid Panel  No results found for: CHOL, TRIG, HDL, CHOLHDL, VLDL, LDLCALC, LDLDIRECT  BMET    Component Value Date/Time   NA 138 05/03/2016 1315   K 3.6 05/03/2016 1315   CL 103 05/03/2016 1315   CO2 29 05/03/2016 1315   GLUCOSE 87 05/03/2016 1315   BUN 12 05/03/2016 1315   CREATININE 0.89 05/03/2016 1315   CALCIUM 9.5 05/03/2016 1315   GFRNONAA >60 05/03/2016 1315   GFRAA >60 05/03/2016 1315  ASSESSMENT:    1. Essential hypertension   2. Dyslipidemia   3. Mild obesity   4. Tobacco abuse   5. Medication management      PLAN:  In order of problems listed above:  1. HTN: Well-controlled. 2. HLP: Recheck lipid profile. 3. Obesity: Described strategies for weight loss involving both diet and exercise. 4. Tobacco abuse: Reviewed the importance of smoking cessation in a lot of detail again today.  He Booton refused to commit.  His wife smokes as well.  They will have to quit as a team, I think.    Medication Adjustments/Labs and Tests Ordered: Current medicines are reviewed at length with the patient today.  Concerns regarding medicines are outlined above.  Medication changes, Labs and Tests ordered today are listed in the Patient Instructions below. Patient Instructions  Dr Royann Shiversroitoru recommends that you schedule a follow-up appointment in 12 months. You will receive a reminder letter in the mail two months in advance. If you don't receive a letter, please call our office to schedule the follow-up appointment.  If you need a refill on your cardiac medications before your next appointment, please call your pharmacy.  Your physician recommends that you return for lab work at your convenience - FASTING.    Signed, Thurmon FairMihai Liliane Mallis, MD  09/24/2017 9:38 AM    [facility] [address],  [place], [place]  [address] Phone: [phone]; Fax: [phone]

## 2017-09-26 ENCOUNTER — Encounter: Payer: Self-pay | Admitting: Cardiovascular Disease

## 2018-04-03 ENCOUNTER — Telehealth: Payer: Self-pay | Admitting: Cardiovascular Disease

## 2018-04-03 NOTE — Telephone Encounter (Signed)
  1. What dental office are you calling from? Dr Aldean Ast   2. What is your office phone number? [phone]  3. What is your fax number? Not working  4. What type of procedure is the patient having performed? Tooth extraction  5. What date is procedure scheduled or is the patient there now? Now  (if the patient is at the dentist's office question goes to their cardiologist if he/she is in the office.  If not, question should go to the DOD).   6. What is your question (ex. Antibiotics prior to procedure, holding medication-we need to know how long dentist wants pt to hold med)?IF patient needs to stop blood thinner before extraction and if so, how many days prior

## 2018-04-03 NOTE — Telephone Encounter (Signed)
Reviewed with Doctor of the day ( Dr Tresa Endo) , per Dr Tresa Endo - no antibiotic needed and record show no blood thinner usage.  RN VERBALLY INFORMED DR Linward Headland OFFICE.

## 2018-05-13 IMAGING — DX DG CHEST 2V
2 series · 2 of 2 positions shown · non-contrast
Comparison: None.

CLINICAL DATA: Shortness of breath with cough and wheezing.

EXAM:
CHEST  2 VIEW

[chest pa]
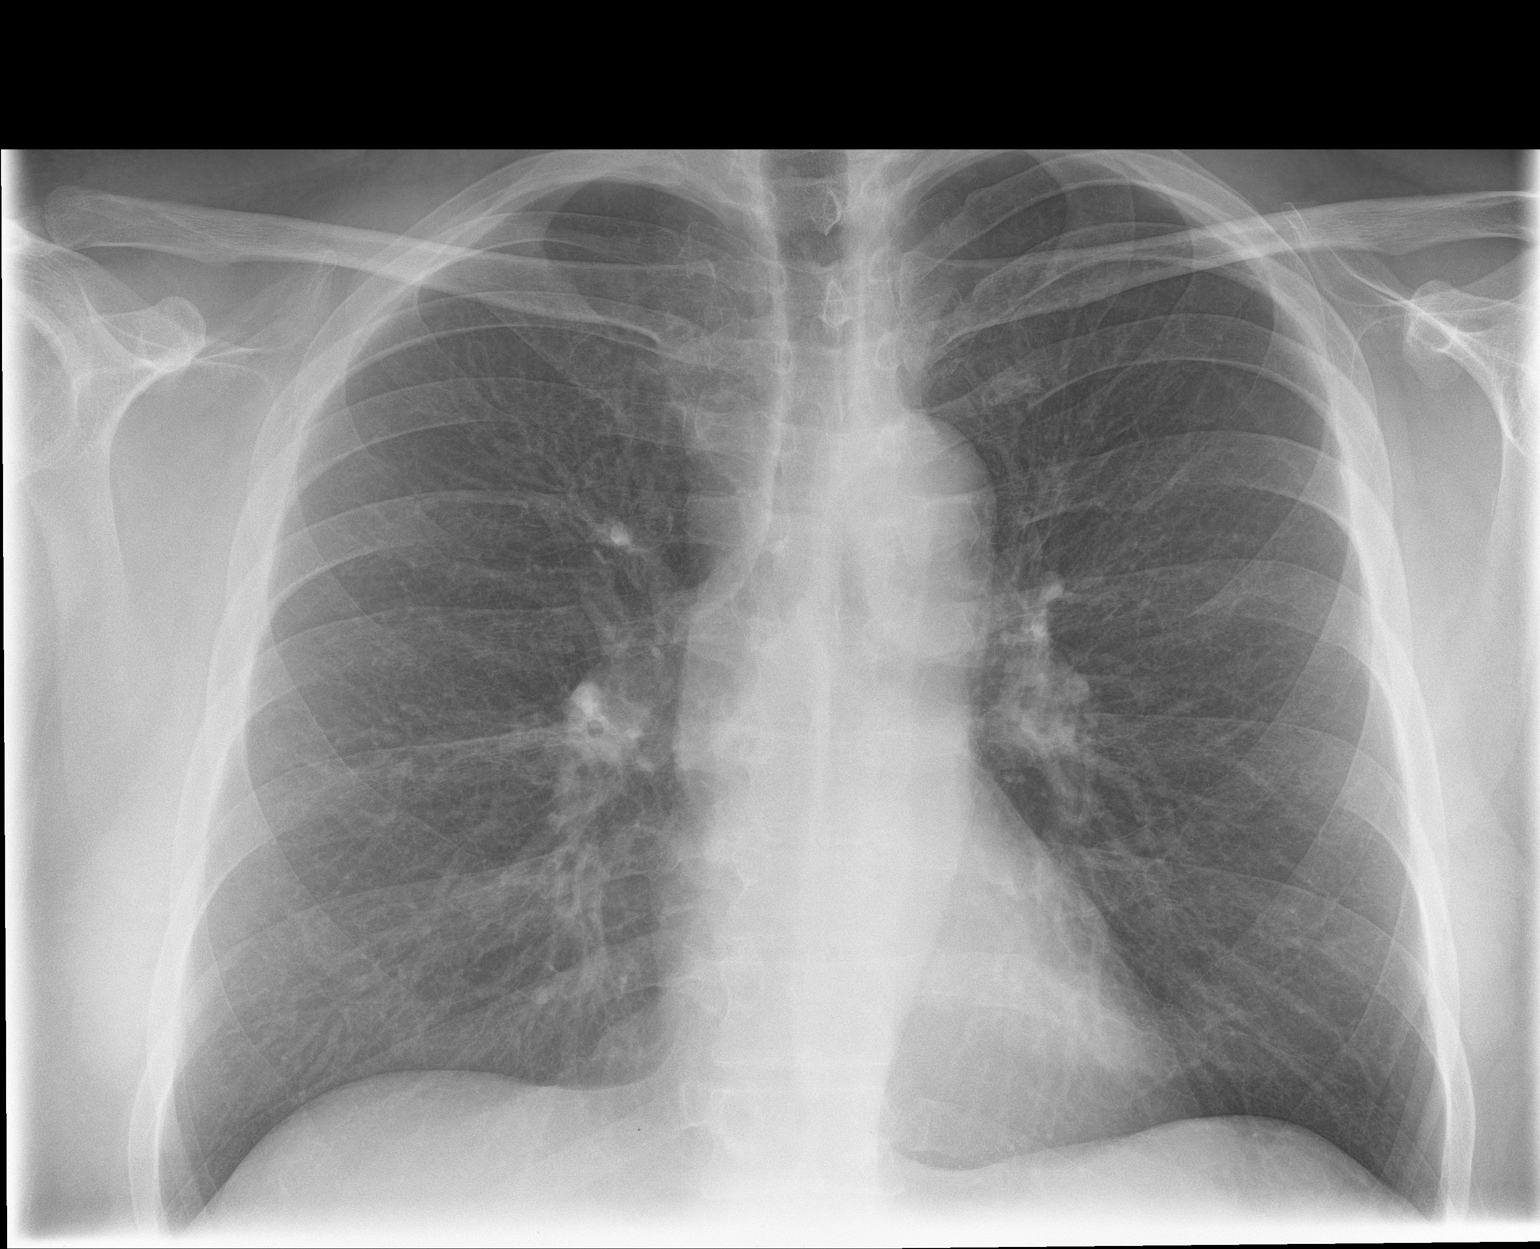

[chest lat]
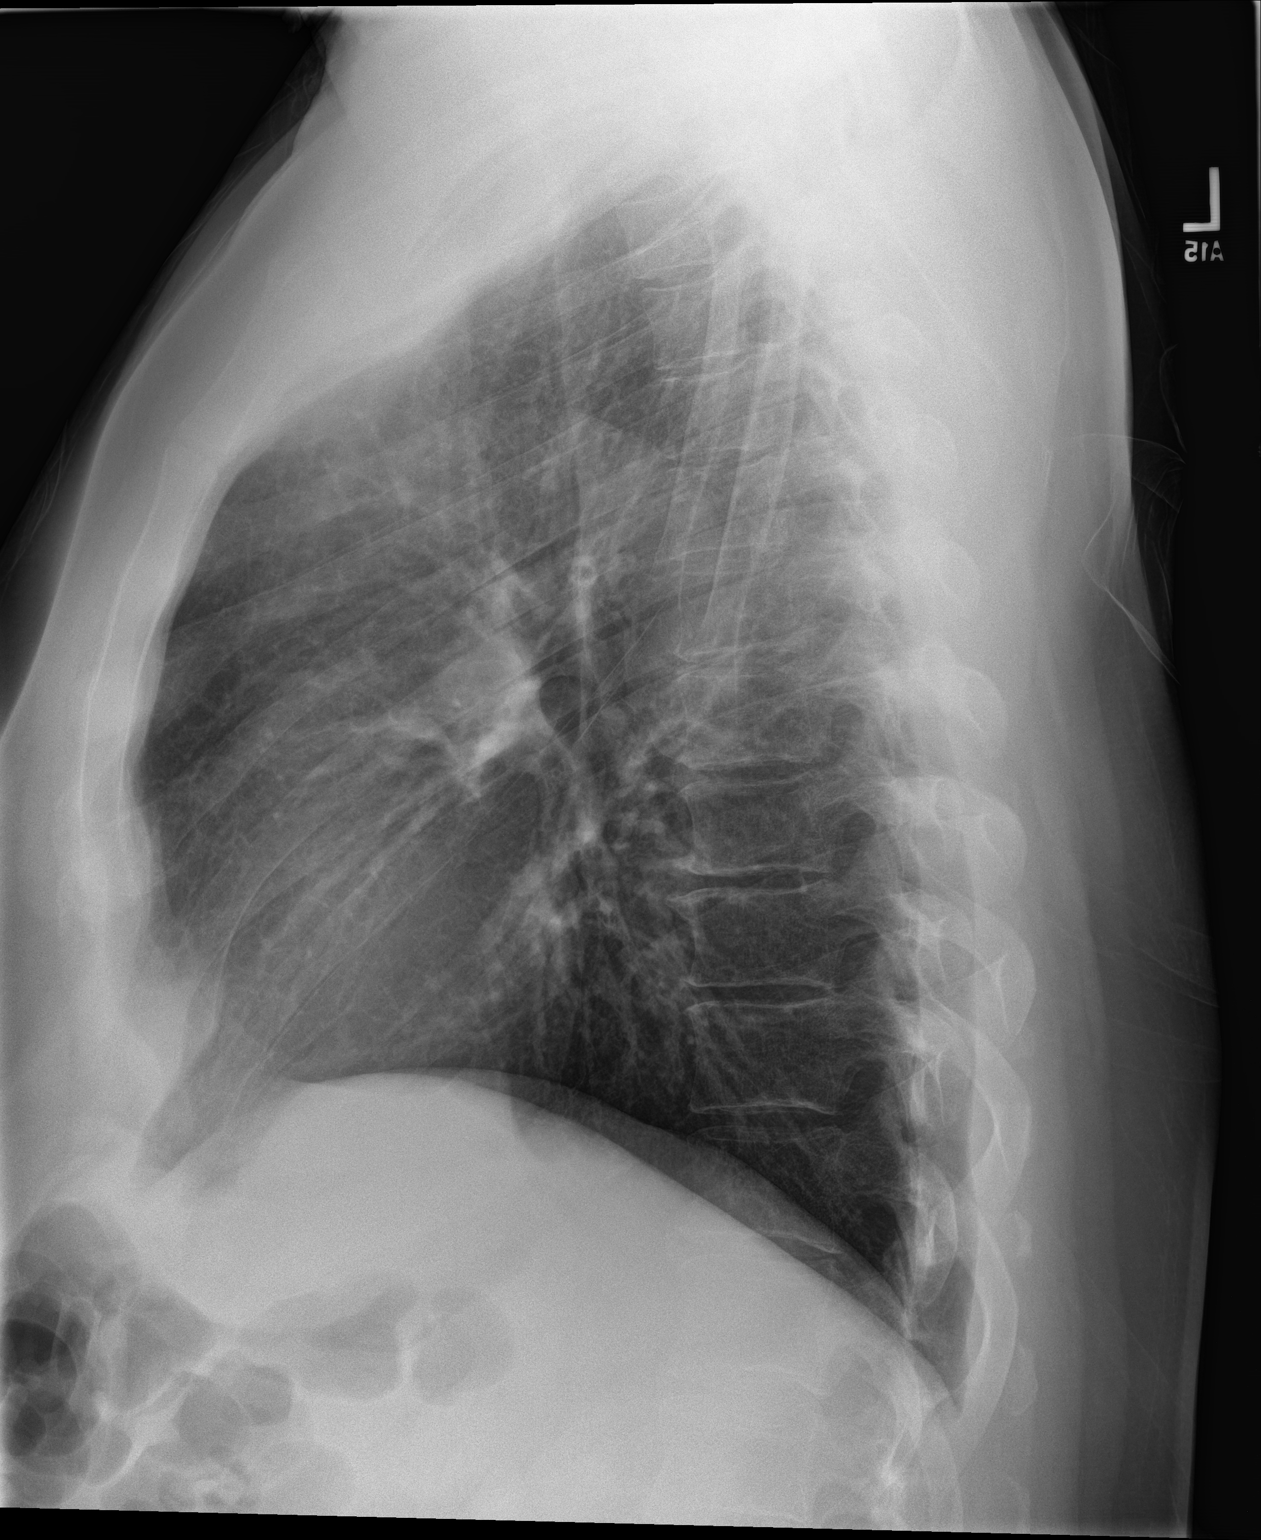

[2 of 2 positions shown; findings below may reference images not displayed]

FINDINGS: The heart size and mediastinal contours are within normal limits.
Both lungs are clear. The visualized skeletal structures are
unremarkable.
IMPRESSION: No active cardiopulmonary disease.

## 2018-10-04 ENCOUNTER — Telehealth: Payer: Self-pay | Admitting: *Deleted

## 2018-10-04 ENCOUNTER — Telehealth (INDEPENDENT_AMBULATORY_CARE_PROVIDER_SITE_OTHER): Payer: 59 | Admitting: Cardiovascular Disease

## 2018-10-04 ENCOUNTER — Encounter: Payer: Self-pay | Admitting: Cardiovascular Disease

## 2018-10-04 VITALS — Ht 73.0 in | Wt 237.0 lb

## 2018-10-04 DIAGNOSIS — E669 Obesity, unspecified: Secondary | ICD-10-CM

## 2018-10-04 DIAGNOSIS — F17200 Nicotine dependence, unspecified, uncomplicated: Secondary | ICD-10-CM

## 2018-10-04 DIAGNOSIS — E785 Hyperlipidemia, unspecified: Secondary | ICD-10-CM | POA: Diagnosis not present

## 2018-10-04 DIAGNOSIS — I10 Essential (primary) hypertension: Secondary | ICD-10-CM

## 2018-10-04 MED ORDER — CARVEDILOL 25 MG PO TABS: 25.0000 mg | ORAL_TABLET | Freq: Two times a day (BID) | ORAL | 3 refills | Status: DC

## 2018-10-04 NOTE — Patient Instructions (Signed)
Medication Instructions:  INCREASE the carvedilol to 25 mg twice daily  If you need a refill on your cardiac medications before your next appointment, please call your pharmacy.   Lab work: Your provider would like for you to return in the new few weeks to have the following labs drawn: FASTING lipids. You do not need an appointment for the lab. Once in our office lobby there is a podium where you can sign in and ring the doorbell to alert [place] that you are here. The lab is open from 8:00 am to 4:30 pm; closed for lunch from 12:45pm-1:45pm.  If you have labs (blood work) drawn today and your tests are completely normal, you will receive your results only by: Marland Kitchen MyChart Message (if you have MyChart) OR . A paper copy in the mail If you have any lab test that is abnormal or we need to change your treatment, we will call you to review the results.  Testing/Procedures: None ordered  Follow-Up: At [facility], you and your health needs are our priority.  As part of our continuing mission to provide you with exceptional heart care, we have created designated Provider Care Teams.  These Care Teams include your primary Cardiologist (physician) and Advanced Practice Providers (APPs -  Physician Assistants and Nurse Practitioners) who all work together to provide you with the care you need, when you need it. You will need a follow up appointment in 12 months.  Please call our office 2 months in advance to schedule this appointment.  You may see Thurmon Fair, MD or one of the following Advanced Practice Providers on your designated Care Team: Azalee Course, PA-C Steps to Quit Smoking Smoking tobacco is the leading cause of preventable death. It can affect almost every organ in the body. Smoking puts you and those around you at risk for developing many serious chronic diseases. Quitting smoking can be difficult, but it is one of the best things that you can do for your health. It is never too late to quit.  How do I get ready to quit? When you decide to quit smoking, create a plan to help you succeed. Before you quit:  Pick a date to quit. Set a date within the next 2 weeks to give you time to prepare.  Write down the reasons why you are quitting. Keep this list in places where you will see it often.  Tell your family, friends, and co-workers that you are quitting. Support from your loved ones can make quitting easier.  Talk with your health care provider about your options for quitting smoking.  Find out what treatment options are covered by your health insurance.  Identify people, places, things, and activities that make you want to smoke (triggers). Avoid them. What first steps can I take to quit smoking?  Throw away all cigarettes at home, at work, and in your car.  Throw away smoking accessories, such as Set designer.  Clean your car. Make sure to empty the ashtray.  Clean your home, including curtains and carpets. What strategies can I use to quit smoking? Talk with your health care provider about combining strategies, such as taking medicines while you are also receiving in-person counseling. Using these two strategies together makes you more likely to succeed in quitting than if you used either strategy on its own.  If you are pregnant or breastfeeding, talk with your health care provider about finding counseling or other support strategies to quit smoking. Do not take  medicine to help you quit smoking unless your health care provider tells you to do so. To quit smoking: Quit right away  Quit smoking completely, instead of gradually reducing how much you smoke over a period of time. Research shows that stopping smoking right away is more successful than gradually quitting.  Attend in-person counseling to help you build problem-solving skills. You are more likely to succeed in quitting if you attend counseling sessions regularly. Even short sessions of 10 minutes can be  effective. Take medicine You may take medicines to help you quit smoking. Some medicines require a prescription and some you can purchase over-the-counter. Medicines may have nicotine in them to replace the nicotine in cigarettes. Medicines may:  Help to stop cravings.  Help to relieve withdrawal symptoms. Your health care provider may recommend:  Nicotine patches, gum, or lozenges.  Nicotine inhalers or sprays.  Non-nicotine medicine that is taken by mouth. Find resources Find resources and support systems that can help you to quit smoking and remain smoke-free after you quit. These resources are most helpful when you use them often. They include:  Online chats with a Veterinary surgeoncounselor.  Telephone quitlines.  Printed Materials engineerself-help materials.  Support groups or group counseling.  Text messaging programs.  Mobile phone apps or applications. Use apps that can help you stick to your quit plan by providing reminders, tips, and encouragement. There are many free apps for mobile devices as well as websites. Examples include Quit Guide from the [employer] and smokefree.gov What things can I do to make it easier to quit?   Reach out to your family and friends for support and encouragement. Call telephone quitlines (1-800-QUIT-NOW), reach out to support groups, or work with a counselor for support.  Ask people who smoke to avoid smoking around you.  Avoid places that trigger you to smoke, such as bars, parties, or smoke-break areas at work.  Spend time with people who do not smoke.  Lessen the stress in your life. Stress can be a smoking trigger for some people. To lessen stress, try: ? Exercising regularly. ? Doing deep-breathing exercises. ? Doing yoga. ? Meditating. ? Performing a body scan. This involves closing your eyes, scanning your body from head to toe, and noticing which parts of your body are particularly tense. Try to relax the muscles in those areas. How will I feel when I quit  smoking? Day 1 to 3 weeks Within the first 24 hours of quitting smoking, you may start to feel withdrawal symptoms. These symptoms are usually most noticeable 2-3 days after quitting, but they usually do not last for more than 2-3 weeks. You may experience these symptoms:  Mood swings.  Restlessness, anxiety, or irritability.  Trouble concentrating.  Dizziness.  Strong cravings for sugary foods and nicotine.  Mild weight gain.  Constipation.  Nausea.  Coughing or a sore throat.  Changes in how the medicines that you take for unrelated issues work in your body.  Depression.  Trouble sleeping (insomnia). Week 3 and afterward After the first 2-3 weeks of quitting, you may start to notice more positive results, such as:  Improved sense of smell and taste.  Decreased coughing and sore throat.  Slower heart rate.  Lower blood pressure.  Clearer skin.  The ability to breathe more easily.  Fewer sick days. Quitting smoking can be very challenging. Do not get discouraged if you are not successful the first time. Some people need to make many attempts to quit before they achieve long-term success.  Do your best to stick to your quit plan, and talk with your health care provider if you have any questions or concerns. Summary  Smoking tobacco is the leading cause of preventable death. Quitting smoking is one of the best things that you can do for your health.  When you decide to quit smoking, create a plan to help you succeed.  Quit smoking right away, not slowly over a period of time.  When you start quitting, seek help from your health care provider, family, or friends. This information is not intended to replace advice given to you by your health care provider. Make sure you discuss any questions you have with your health care provider. Document Released: 01/10/2001 Document Revised: 04/05/2018 Document Reviewed: 04/06/2018 Elsevier Patient Education  2020 [place], PA-C  Any Other Special Instructions Will Be Listed Below (If Applicable). Please send your blood pressure readings using MyChart. Sign up instructions and a code will be on this sheet. If you need help, please call us at [phone]

## 2018-10-04 NOTE — Telephone Encounter (Signed)
The patient has been called about the virtual appointment today with Dr. Croitoru. Instructions provided. The AVS will be mailed. The patient verbalized their understanding.  

## 2018-10-04 NOTE — Progress Notes (Signed)
Virtual Visit via Video Note   This visit type was conducted due to national recommendations for restrictions regarding the COVID-19 Pandemic (e.g. social distancing) in an effort to limit this patient's exposure and mitigate transmission in our community.  Due to his co-morbid illnesses, this patient is at least at moderate risk for complications without adequate follow up.  This format is felt to be most appropriate for this patient at this time.  All issues noted in this document were discussed and addressed.  A limited physical exam was performed with this format.  Please refer to the patient's chart for his consent to telehealth for [facility].   Date:  10/04/2018   ID:  Brent Nguyen, DOB 1965-05-18, MRN [id number]  Patient Location: Other:  Work Provider Location: Home  PCP:  Jani Gravel, MD  Cardiologist:  Haya Hemler Electrophysiologist:  None   Evaluation Performed:  Follow-Up Visit  Chief Complaint:  HTN  History of Present Illness:    Brent Nguyen is a 53 y.o. male with longstanding hypertension, hypercholesterolemia, smoker.  His blood pressure has been well controlled but he has recently developed severe lumbar spine problems.  It sounds like he is got a couple of herniated disks and he has numbness and loss of muscle strength in his right leg.  He sometimes trips over the tips of his toes suggesting he may have a little bit of foot drop.  Physical therapy actually made the situation worse.  He is received a couple of back injections and it is unclear whether he would need surgery.  Workman's Compensation is involved.  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  He has not had any cardiovascular complaints, but his blood pressure has been very high.  Once when he went for evaluation for his back his diastolic blood pressure was 131.  His most recent blood pressure was around 170/93.  He has been taking his medications as prescribed.   He has been avoiding NSAIDs since he knows these will raise his blood pressure.  Flexeril helps with his back problem, but makes him feel very groggy the next day so he is trying not to take that either.  He denies dyspnea at rest or with activity, chest pain at rest or with activity, headaches, syncope, dizziness, palpitations, focal neurological complaints, intermittent claudication.  He has occasional mild swelling in his right ankle (the sock leaves indentation).  He has not had a recent lipid profile.  He continues to smoke a pack of cigarettes daily.  The patient does not have symptoms concerning for COVID-19 infection (fever, chills, cough, or new shortness of breath).    Past Medical History:  Diagnosis Date  . Chronic back pain   . Diverticulosis    a. s/p partial colectomy 2005 for anastamotic leak with reversal.  . Hyperlipidemia   . Hypertension   . Normal cardiac stress test 2015   low risk  . Obesity   . Sciatica, left side   . Tobacco abuse    Past Surgical History:  Procedure Laterality Date  . COLOSTOMY REVERSAL    . KNEE ARTHROSCOPY Right   . KNEE ARTHROSCOPY Right 01/20/2013   Procedure: ARTHROSCOPY KNEE;  Surgeon: Sanjuana Kava, MD;  Location: [facility];  Service: Orthopedics;  Laterality: Right;  . MENISECTOMY Right 01/20/2013   Procedure: partial medial menisectomy;  Surgeon: Sanjuana Kava, MD;  Location: [facility];  Service: Orthopedics;  Laterality: Right;  . PARTIAL COLECTOMY  2 subsequent surgeries to reverse his colostomy-diverticulosis  . WISDOM TOOTH EXTRACTION       Current Meds  Medication Sig  . amLODipine (NORVASC) 5 MG tablet Take 1 tablet (5 mg total) by mouth daily.  . carvedilol (COREG) 12.5 MG tablet Take 1 tablet (12.5 mg total) by mouth 2 (two) times daily with a meal.     Allergies:   Penicillins   Social History   Tobacco Use  . Smoking status: Current Every Day Smoker    Packs/day: 1.00    Years: 25.00    Pack years: 25.00     Types: Cigarettes  . Smokeless tobacco: Former Engineer, water Use Topics  . Alcohol use: Yes    Comment: On occasion, 1 or 2 beers per month  . Drug use: No     Family Hx: The patient's family history includes ALS in his maternal grandfather; Cancer in his father, maternal grandmother, and paternal grandfather; Celiac disease in his brother and mother; Diabetes in his maternal grandfather; Diverticulosis in his brother, daughter, and mother; Heart attack in his paternal grandmother; Hypertension in his mother and paternal grandmother; Hypotension in his brother; Sleep apnea in his mother; Ulcers in his brother, maternal grandfather, and mother.  ROS:   Please see the history of present illness.    All other systems reviewed and are negative.   Prior CV studies:   The following studies were reviewed today:    Labs/Other Tests and Data Reviewed:    EKG:  An ECG dated 09/24/2017 was personally reviewed today and demonstrated:  Normal sinus rhythm  Recent Labs: No results found for requested labs within last 8760 hours.   Recent Lipid Panel No results found for: CHOL, TRIG, HDL, CHOLHDL, LDLCALC, LDLDIRECT  Wt Readings from Last 3 Encounters:  10/04/18 237 lb (107.5 kg)  09/24/17 238 lb (108 kg)  08/25/16 236 lb (107 kg)     Objective:    Vital Signs:  Ht 6\' 1"  (1.854 m)   Wt 237 lb (107.5 kg)   BMI 31.27 kg/m    VITAL SIGNS:  reviewed GEN:  no acute distress EYES:  sclerae anicteric, EOMI - Extraocular Movements Intact RESPIRATORY:  normal respiratory effort, symmetric expansion CARDIOVASCULAR:  no peripheral edema SKIN:  no rash, lesions or ulcers. MUSCULOSKELETAL:  no obvious deformities. NEURO:  alert and oriented x 3, no obvious focal deficit PSYCH:  normal affect Mildly obese  ASSESSMENT & PLAN:    1. HTN: His elevation in blood pressure is clearly adrenergically driven, due to pain in the stress of his disability.  Will increase his carvedilol to 25 mg  twice daily.  For the effects of this should be seen promptly.  He will report his blood pressure early next week and if it [place] high we will add a low-dose of a thiazide diuretic. 2. Hypercholesterolemia: We will need to recheck his lipids. 3. Smoking: Reinforced the fact that smoking cessation is a very important long-term goal.  He does not think he can quit smoking with his current health problems. 4. Obesity: Clearly less physically active.  Not possible to increase exercise at this point.  Needs to try to curtail calorie intake.  COVID-19 Education: The signs and symptoms of COVID-19 were discussed with the patient and how to seek care for testing (follow up with PCP or arrange E-visit).  The importance of social distancing was discussed today.  Time:   Today, I have spent 17 minutes with the patient with  telehealth technology discussing the above problems.     Medication Adjustments/Labs and Tests Ordered: Current medicines are reviewed at length with the patient today.  Concerns regarding medicines are outlined above.   Tests Ordered: No orders of the defined types were placed in this encounter.   Medication Changes: No orders of the defined types were placed in this encounter.   Follow Up:  Virtual Visit or In Person 3 months  Signed, Thurmon FairMihai Joana Nolton, MD  10/04/2018 8:10 AM    [facility]

## 2018-10-04 NOTE — Telephone Encounter (Signed)
Virtual Visit Pre-Appointment Phone Call  "(Name), I am calling you today to discuss your upcoming appointment. We are currently trying to limit exposure to the virus that causes COVID-19 by seeing patients at home rather than in the office."  1. "What is the BEST phone number to call the day of the visit?" - include this in appointment notes  2. "Do you have or have access to (through a family member/friend) a smartphone with video capability that we can use for your visit?" a. If yes - list this number in appt notes as "cell" (if different from BEST phone #) and list the appointment type as a VIDEO visit in appointment notes b. If no - list the appointment type as a PHONE visit in appointment notes  3. Confirm consent - "In the setting of the current Covid19 crisis, you are scheduled for a (phone or video) visit with your provider on (date) at (time).  Just as we do with many in-office visits, in order for you to participate in this visit, we must obtain consent.  If you'd like, I can send this to your mychart (if signed up) or email for you to review.  Otherwise, I can obtain your verbal consent now.  All virtual visits are billed to your insurance company just like a normal visit would be.  By agreeing to a virtual visit, we'd like you to understand that the technology does not allow for your provider to perform an examination, and thus may limit your provider's ability to fully assess your condition. If your provider identifies any concerns that need to be evaluated in person, we will make arrangements to do so.  Finally, though the technology is pretty good, we cannot assure that it will always work on either your or our end, and in the setting of a video visit, we may have to convert it to a phone-only visit.  In either situation, we cannot ensure that we have a secure connection.  Are you willing to proceed?" YES  4. Advise patient to be prepared - "Two hours prior to your appointment, go  ahead and check your blood pressure, pulse, oxygen saturation, and your weight (if you have the equipment to check those) and write them all down. When your visit starts, your provider will ask you for this information. If you have an Apple Watch or Kardia device, please plan to have heart rate information ready on the day of your appointment. Please have a pen and paper handy nearby the day of the visit as well."  5. Give patient instructions for MyChart download to smartphone OR [email or web address] as below if video visit (depending on what platform provider is using)  6. Inform patient they will receive a phone call 15 minutes prior to their appointment time (may be from unknown caller ID) so they should be prepared to answer    [facility] has been deemed a candidate for a follow-up tele-health visit to limit community exposure during the Covid-19 pandemic. I spoke with the patient via phone to ensure availability of phone/video source, confirm preferred email & phone number, and discuss instructions and expectations.  I reminded Brent Nguyen to be prepared with any vital sign and/or heart rhythm information that could potentially be obtained via home monitoring, at the time of his visit. I reminded Brent Nguyen to expect a phone call prior to his visit.  Ricci Barker, RN 10/04/2018 8:09 AM   INSTRUCTIONS  FOR DOWNLOADING THE MYCHART APP TO SMARTPHONE  - The patient must first make sure to have activated MyChart and know their login information - If Apple, go to [employer] and type in MyChart in the search bar and download the app. If Android, ask patient to go to [employer] and type in [place] in the search bar and download the app. The app is free but as with any other app downloads, their phone may require them to verify saved payment information or Apple/Android password.  - The patient will need to then log into the app with their MyChart username and  password, and select [facility] as their healthcare provider to link the account. When it is time for your visit, go to the MyChart app, find appointments, and click Begin Video Visit. Be sure to Select Allow for your device to access the Microphone and Camera for your visit. You will then be connected, and your provider will be with you shortly.  **If they have any issues connecting, or need assistance please contact MyChart service desk [phone])**  **If using a computer, in order to ensure the best quality for their visit they will need to use either of the following Internet Browsers: [employer], or Google Chrome**  IF USING DOXIMITY or DOXY.ME - The patient will receive a link just prior to their visit by text.     FULL LENGTH CONSENT FOR TELE-HEALTH VISIT   I hereby voluntarily request, consent and authorize [facility] and its employed or contracted physicians, physician assistants, nurse practitioners or other licensed health care professionals (the Practitioner), to provide me with telemedicine health care services (the "Services") as deemed necessary by the treating Practitioner. I acknowledge and consent to receive the Services by the Practitioner via telemedicine. I understand that the telemedicine visit will involve communicating with the Practitioner through live audiovisual communication technology and the disclosure of certain medical information by electronic transmission. I acknowledge that I have been given the opportunity to request an in-person assessment or other available alternative prior to the telemedicine visit and am voluntarily participating in the telemedicine visit.  I understand that I have the right to withhold or withdraw my consent to the use of telemedicine in the course of my care at any time, without affecting my right to future care or treatment, and that the Practitioner or I may terminate the telemedicine visit at any time. I  understand that I have the right to inspect all information obtained and/or recorded in the course of the telemedicine visit and may receive copies of available information for a reasonable fee.  I understand that some of the potential risks of receiving the Services via telemedicine include:  Marland Kitchen Delay or interruption in medical evaluation due to technological equipment failure or disruption; . Information transmitted may not be sufficient (e.g. poor resolution of images) to allow for appropriate medical decision making by the Practitioner; and/or  . In rare instances, security protocols could fail, causing a breach of personal health information.  Furthermore, I acknowledge that it is my responsibility to provide information about my medical history, conditions and care that is complete and accurate to the best of my ability. I acknowledge that Practitioner's advice, recommendations, and/or decision may be based on factors not within their control, such as incomplete or inaccurate data provided by me or distortions of diagnostic images or specimens that may result from electronic transmissions. I understand that the practice of medicine is not an exact science and  that Practitioner makes no warranties or guarantees regarding treatment outcomes. I acknowledge that I will receive a copy of this consent concurrently upon execution via email to the email address I last provided but may also request a printed copy by calling the office of [facility].    I understand that my insurance will be billed for this visit.   I have read or had this consent read to me. . I understand the contents of this consent, which adequately explains the benefits and risks of the Services being provided via telemedicine.  . I have been provided ample opportunity to ask questions regarding this consent and the Services and have had my questions answered to my satisfaction. . I give my informed consent for the services to be  provided through the use of telemedicine in my medical care  By participating in this telemedicine visit I agree to the above.

## 2018-10-25 LAB — LIPID PANEL
Chol/HDL Ratio: 4.9 ratio (ref 0.0–5.0)
Cholesterol, Total: 163 mg/dL (ref 100–199)
HDL: 33 mg/dL — ABNORMAL LOW (ref >39)
LDL Chol Calc (NIH): 117 mg/dL — ABNORMAL HIGH (ref 0–99)
Triglycerides: 68 mg/dL (ref 0–149)
VLDL Cholesterol Cal: 13 mg/dL (ref 5–40)

## 2018-10-28 ENCOUNTER — Other Ambulatory Visit: Payer: Self-pay | Admitting: Cardiovascular Disease

## 2019-01-29 ENCOUNTER — Telehealth (INDEPENDENT_AMBULATORY_CARE_PROVIDER_SITE_OTHER): Payer: Self-pay

## 2019-01-31 HISTORY — PX: BACK SURGERY: SHX140

## 2019-02-03 NOTE — Telephone Encounter (Signed)
Return call;no answer at this  time.

## 2019-03-26 DIAGNOSIS — M5416 Radiculopathy, lumbar region: Secondary | ICD-10-CM | POA: Insufficient documentation

## 2019-10-06 ENCOUNTER — Other Ambulatory Visit: Payer: Self-pay | Admitting: Cardiovascular Disease

## 2019-10-08 ENCOUNTER — Encounter: Payer: Self-pay | Admitting: Cardiovascular Disease

## 2019-10-08 ENCOUNTER — Other Ambulatory Visit: Payer: Self-pay

## 2019-10-08 ENCOUNTER — Ambulatory Visit (INDEPENDENT_AMBULATORY_CARE_PROVIDER_SITE_OTHER): Payer: 59 | Admitting: Cardiovascular Disease

## 2019-10-08 VITALS — BP 140/91 | HR 71 | Ht 73.0 in | Wt 231.2 lb

## 2019-10-08 DIAGNOSIS — F17200 Nicotine dependence, unspecified, uncomplicated: Secondary | ICD-10-CM | POA: Diagnosis not present

## 2019-10-08 DIAGNOSIS — I10 Essential (primary) hypertension: Secondary | ICD-10-CM

## 2019-10-08 DIAGNOSIS — E669 Obesity, unspecified: Secondary | ICD-10-CM | POA: Diagnosis not present

## 2019-10-08 DIAGNOSIS — T148XXA Other injury of unspecified body region, initial encounter: Secondary | ICD-10-CM

## 2019-10-08 DIAGNOSIS — E785 Hyperlipidemia, unspecified: Secondary | ICD-10-CM | POA: Diagnosis not present

## 2019-10-08 NOTE — Patient Instructions (Signed)
Medication Instructions:  No changes *If you need a refill on your cardiac medications before your next appointment, please call your pharmacy*   Lab Work: Your provider would like for you to have the following labs today: CBC, CMET, Lipid and Protime INR  If you have labs (blood work) drawn today and your tests are completely normal, you will receive your results only by:  MyChart Message (if you have MyChart) OR  A paper copy in the mail If you have any lab test that is abnormal or we need to change your treatment, we will call you to review the results.   Testing/Procedures: None ordered   Follow-Up: At [facility], you and your health needs are our priority.  As part of our continuing mission to provide you with exceptional heart care, we have created designated Provider Care Teams.  These Care Teams include your primary Cardiologist (physician) and Advanced Practice Providers (APPs -  Physician Assistants and Nurse Practitioners) who all work together to provide you with the care you need, when you need it.  We recommend signing up for the patient portal called "MyChart".  Sign up information is provided on this After Visit Summary.  MyChart is used to connect with patients for Virtual Visits (Telemedicine).  Patients are able to view lab/test results, encounter notes, upcoming appointments, etc.  Non-urgent messages can be sent to your provider as well.   To learn more about what you can do with MyChart, go to [email or web address].    Your next appointment:   12 month(s)  The format for your next appointment:   In Person  Provider:   Thurmon Fair, MD

## 2019-10-08 NOTE — Progress Notes (Signed)
Virtual Visit via Video Note   This visit type was conducted due to national recommendations for restrictions regarding the COVID-19 Pandemic (e.g. social distancing) in an effort to limit this patient's exposure and mitigate transmission in our community.  Due to his co-morbid illnesses, this patient is at least at moderate risk for complications without adequate follow up.  This format is felt to be most appropriate for this patient at this time.  All issues noted in this document were discussed and addressed.  A limited physical exam was performed with this format.  Please refer to the patient's chart for his consent to telehealth for [facility].   Date:  10/08/2019   ID:  Doris Cheadle Valido, DOB 05-13-1965, MRN [id number]  Patient Location: Other:  Work Provider Location: Home  PCP:  Pearson Grippe, MD  Cardiologist:  Forney Kleinpeter Electrophysiologist:  None   Evaluation Performed:  Follow-Up Visit  Chief Complaint:  HTN  History of Present Illness:    Brent Nguyen is a 54 y.o. male with longstanding hypertension, hypercholesterolemia, smoker.  He has had a rough year.  After his back injury last June, he eventually underwent lumbar spine surgery in April (Dr. Neomia Dear, [facility]), after conservative treatment failed.  He really has not improved and remains in a lot of pain.  He complains of permanent loss of sensation in his right foot and intermittent loss of sensation on the left side.  Unfortunately, not long after he had a fall and injured his left knee and required knee surgery.  While in the hospital recovering from the knee procedure, he again fell and fractured 2 toes in his right foot.  Surprisingly, he is actually managed to lose a few pounds, although he remains borderline obese.  He is unable to exercise and is sometimes in severe pain.  TENS units have not helped.  He denies angina or chest pain either at rest or with activity, palpitations, syncope, dizziness, leg edema,  claudication (she really cannot walk much), recent falls or injuries.  He has noticed constant problems with severe bruising, especially on his forearms, ever since his surgery.  He is easily developed hematomas and large ecchymosis with minimal injury.  Unfortunately continues to smoke.    Past Medical History:  Diagnosis Date  . Chronic back pain   . Diverticulosis    a. s/p partial colectomy 2005 for anastamotic leak with reversal.  . Hyperlipidemia   . Hypertension   . Normal cardiac stress test 2015   low risk  . Obesity   . Sciatica, left side   . Tobacco abuse    Past Surgical History:  Procedure Laterality Date  . COLOSTOMY REVERSAL    . KNEE ARTHROSCOPY Right   . KNEE ARTHROSCOPY Right 01/20/2013   Procedure: ARTHROSCOPY KNEE;  Surgeon: Darreld Mclean, MD;  Location: [facility];  Service: Orthopedics;  Laterality: Right;  . MENISECTOMY Right 01/20/2013   Procedure: partial medial menisectomy;  Surgeon: Darreld Mclean, MD;  Location: [facility];  Service: Orthopedics;  Laterality: Right;  . PARTIAL COLECTOMY     2 subsequent surgeries to reverse his colostomy-diverticulosis  . WISDOM TOOTH EXTRACTION       Current Meds  Medication Sig  . amLODipine (NORVASC) 5 MG tablet TAKE 1 TABLET BY MOUTH EVERY DAY  . carvedilol (COREG) 25 MG tablet TAKE 1 TABLET BY MOUTH TWICE A DAY  . HYDROcodone-acetaminophen (NORCO/VICODIN) 5-325 MG tablet hydrocodone 5 mg-acetaminophen 325 mg tablet  TAKE 1 TABLET BY MOUTH EVERY DAY  AS NEEDED FOR PAIN  . oxyCODONE-acetaminophen (ROXICET) 5-325 MG/5ML solution SMARTSIG:1-2 Tablet(s) By Mouth Every 4 Hours PRN     Allergies:   Penicillins   Social History   Tobacco Use  . Smoking status: Current Every Day Smoker    Packs/day: 1.00    Years: 25.00    Pack years: 25.00    Types: Cigarettes  . Smokeless tobacco: Former Engineer, water Use Topics  . Alcohol use: Yes    Comment: On occasion, 1 or 2 beers per month  . Drug use: No     Family  Hx: The patient's family history includes ALS in his maternal grandfather; Cancer in his father, maternal grandmother, and paternal grandfather; Celiac disease in his brother and mother; Diabetes in his maternal grandfather; Diverticulosis in his brother, daughter, and mother; Heart attack in his paternal grandmother; Hypertension in his mother and paternal grandmother; Hypotension in his brother; Sleep apnea in his mother; Ulcers in his brother, maternal grandfather, and mother.  ROS:   Please see the history of present illness.    All other systems reviewed and are negative.   Prior CV studies:   The following studies were reviewed today:    Labs/Other Tests and Data Reviewed:    EKG:  An ECG dated 09/24/2017 was personally reviewed today and demonstrated:  Normal sinus rhythm  Recent Labs: No results found for requested labs within last 8760 hours.   Recent Lipid Panel Lab Results  Component Value Date/Time   CHOL 163 10/25/2018 09:53 AM   TRIG 68 10/25/2018 09:53 AM   HDL 33 (L) 10/25/2018 09:53 AM   CHOLHDL 4.9 10/25/2018 09:53 AM   LDLCALC 117 (H) 10/25/2018 09:53 AM    Wt Readings from Last 3 Encounters:  10/08/19 231 lb 3.2 oz (104.9 kg)  10/04/18 237 lb (107.5 kg)  09/24/17 238 lb (108 kg)     Objective:    Vital Signs:  BP (!) 140/91   Pulse 71   Ht 6\' 1"  (1.854 m)   Wt 231 lb 3.2 oz (104.9 kg)   SpO2 97%   BMI 30.50 kg/m     General: Alert, oriented x3, no distress, borderline obese Head: no evidence of trauma, PERRL, EOMI, no exophtalmos or lid lag, no myxedema, no xanthelasma; normal ears, nose and oropharynx Neck: normal jugular venous pulsations and no hepatojugular reflux; brisk carotid pulses without delay and no carotid bruits Chest: clear to auscultation, no signs of consolidation by percussion or palpation, normal fremitus, symmetrical and full respiratory excursions Cardiovascular: normal position and quality of the apical impulse, regular  rhythm, normal first and second heart sounds, no murmurs, rubs or gallops Abdomen: no tenderness or distention, no masses by palpation, no abnormal pulsatility or arterial bruits, normal bowel sounds, no hepatosplenomegaly Extremities: no clubbing, cyanosis or edema; 2+ radial, ulnar and brachial pulses bilaterally; 2+ right femoral, posterior tibial and dorsalis pedis pulses; 2+ left femoral, posterior tibial and dorsalis pedis pulses; no subclavian or femoral bruits Neurological: grossly nonfocal Psych: Normal mood and affect   ASSESSMENT & PLAN:     1. Essential hypertension   2. Dyslipidemia   3. Smoking   4. Mild obesity   5. Bruising     1. HTN: considering the pain he is in, BP control is acceptable. Avoid NSAIDs, salty foods 2. Hypercholesterolemia: due a repeat lipid profile (had a little peanut butter, so if TG are a little high would not be a surprise). 3. Smoking: unfortunately his back  pain has prevented him from even considering quitting 4. Obesity: unable to exercise. 5. Purpura/easy bruising:  Check platelets, coag studies    Patient Instructions  Medication Instructions:  No changes *If you need a refill on your cardiac medications before your next appointment, please call your pharmacy*   Lab Work: Your provider would like for you to have the following labs today: CBC, CMET, Lipid and Protime INR  If you have labs (blood work) drawn today and your tests are completely normal, you will receive your results only by: Marland Kitchen MyChart Message (if you have MyChart) OR . A paper copy in the mail If you have any lab test that is abnormal or we need to change your treatment, we will call you to review the results.   Testing/Procedures: None ordered   Follow-Up: At [facility], you and your health needs are our priority.  As part of our continuing mission to provide you with exceptional heart care, we have created designated Provider Care Teams.  These Care Teams  include your primary Cardiologist (physician) and Advanced Practice Providers (APPs -  Physician Assistants and Nurse Practitioners) who all work together to provide you with the care you need, when you need it.  We recommend signing up for the patient portal called "MyChart".  Sign up information is provided on this After Visit Summary.  MyChart is used to connect with patients for Virtual Visits (Telemedicine).  Patients are able to view lab/test results, encounter notes, upcoming appointments, etc.  Non-urgent messages can be sent to your provider as well.   To learn more about what you can do with MyChart, go to [email or web address].    Your next appointment:   12 month(s)  The format for your next appointment:   In Person  Provider:   Thurmon Fair, MD      Signed, Thurmon Fair, MD  10/08/2019 8:38 PM    [facility]

## 2019-10-09 ENCOUNTER — Encounter: Payer: Self-pay | Admitting: *Deleted

## 2019-10-09 LAB — CBC
Hematocrit: 49 % (ref 37.5–51.0)
Hemoglobin: 16.5 g/dL (ref 13.0–17.7)
MCH: 29.6 pg (ref 26.6–33.0)
MCHC: 33.7 g/dL (ref 31.5–35.7)
MCV: 88 fL (ref 79–97)
Platelets: 259 10*3/uL (ref 150–450)
RBC: 5.58 x10E6/uL (ref 4.14–5.80)
RDW: 15.4 % (ref 11.6–15.4)
WBC: 10.5 10*3/uL (ref 3.4–10.8)

## 2019-10-09 LAB — LIPID PANEL
Chol/HDL Ratio: 4 ratio (ref 0.0–5.0)
Cholesterol, Total: 174 mg/dL (ref 100–199)
HDL: 43 mg/dL (ref >39)
LDL Chol Calc (NIH): 117 mg/dL — ABNORMAL HIGH (ref 0–99)
Triglycerides: 74 mg/dL (ref 0–149)
VLDL Cholesterol Cal: 14 mg/dL (ref 5–40)

## 2019-10-09 LAB — COMPREHENSIVE METABOLIC PANEL
ALT: 22 IU/L (ref 0–44)
AST: 23 IU/L (ref 0–40)
Albumin/Globulin Ratio: 1.9 (ref 1.2–2.2)
Albumin: 4.7 g/dL (ref 3.8–4.9)
Alkaline Phosphatase: 77 IU/L (ref 48–121)
BUN/Creatinine Ratio: 15 (ref 9–20)
BUN: 10 mg/dL (ref 6–24)
Bilirubin Total: 0.5 mg/dL (ref 0.0–1.2)
CO2: 27 mmol/L (ref 20–29)
Calcium: 9.7 mg/dL (ref 8.7–10.2)
Chloride: 102 mmol/L (ref 96–106)
Creatinine, Ser: 0.65 mg/dL — ABNORMAL LOW (ref 0.76–1.27)
GFR calc Af Amer: 128 mL/min/{1.73_m2} (ref >59)
GFR calc non Af Amer: 110 mL/min/{1.73_m2} (ref >59)
Globulin, Total: 2.5 g/dL (ref 1.5–4.5)
Glucose: 80 mg/dL (ref 65–99)
Potassium: 4.5 mmol/L (ref 3.5–5.2)
Sodium: 140 mmol/L (ref 134–144)
Total Protein: 7.2 g/dL (ref 6.0–8.5)

## 2019-10-09 LAB — PROTIME-INR

## 2019-10-23 ENCOUNTER — Other Ambulatory Visit: Payer: Self-pay | Admitting: Cardiovascular Disease

## 2020-02-04 ENCOUNTER — Telehealth: Payer: Self-pay | Admitting: Cardiovascular Disease

## 2020-02-04 DIAGNOSIS — T148XXA Other injury of unspecified body region, initial encounter: Secondary | ICD-10-CM

## 2020-02-04 NOTE — Telephone Encounter (Signed)
Patient has been made aware. Referral placed to Hematology since the patient does not have a PCP

## 2020-02-04 NOTE — Telephone Encounter (Signed)
Patient is calling in stating that if he gets scratched he bleeds really bad and is having bruising on his body from the slightest touch, he would like to speak to a nurse about this matter. Please advise.

## 2020-02-04 NOTE — Telephone Encounter (Signed)
He is not on aspirin or any other meds that cause poor clotting or increased bruising. His platelet count was normal in September (and I believe tha complaint is older than September). Discuss with PCP or Hematologist (refer to hematology if he requests). Thank you

## 2020-02-04 NOTE — Telephone Encounter (Signed)
Returned the call to the patient. He stated that he is Brent Nguyen having bad bruising problems. Labs done in September were all normal.  He stated that his granddaughter touched his arm and it bruised the same day. This has been going on since his back and knee surgery back in April.   He has not seen his PCP in 3 years.

## 2020-02-19 NOTE — Progress Notes (Signed)
[facility] CONSULT NOTE  Patient Care Team: Pearson Grippe, MD as PCP - General (Internal Medicine)  CHIEF COMPLAINTS/PURPOSE OF CONSULTATION:  Easy bruising  ASSESSMENT & PLAN:  No problem-specific Assessment & Plan notes found for this encounter.  Orders Placed This Encounter  Procedures  . CBC with Differential/Platelet    Standing Status:   Standing    Number of Occurrences:   22    Standing Expiration Date:   02/19/2021  . Pathologist smear review  . Protime-INR    Standing Status:   Standing    Number of Occurrences:   1  . APTT    Standing Status:   Future    Standing Expiration Date:   02/19/2021  . Comprehensive metabolic panel    Standing Status:   Standing    Number of Occurrences:   33    Standing Expiration Date:   02/19/2021  . Platelet function assay    Standing Status:   Future    Standing Expiration Date:   02/19/2021   1. Easy bruising, likely secondary to prednisone use. CBC normal. Added PFA, INR and PTT today along with repeat CBC and smear review Detailed hematology history doesn't suggest a bleeding diathesis. PE today, no major findings, no lymphadenopathy or hepatosplenomegaly. I have reviewed his CBC which is unremarkable. I explained to him this is unlikely any life threatening issue given how well he did with hemostasis during his surgeries RTC in 3 weeks for FU.  2. Back pain, MSK issues Recommend FU with his orthopaedic surgery and PCP    HISTORY OF PRESENTING ILLNESS:  Brent Nguyen 55 y.o. male is here because of easy bruising.  Mr Belmar is here for an initial visit to discuss new onset easy bruising. Mr Moro arrived to the appointment by himself. He says had some trauma and has been dealing with workmen comp for the past several months, had to under go back surgery in April and since then he has been noticing some bruising for the past several months, mostly on upper extremities, sometimes on the shin. His skin is paper  like, and even when he takes crackers out of the box, gets an abrasion and bruised. He denies any bleeding issues during surgery. He denies any hematochezia or melena. Never needed a blood transfusion, had multiple procedures. He didn't have any bleeding mucosal surfaces when he got his teeth cleaned. No family history of bleeding disorders. His most bothersome complaint today is back pain,    REVIEW OF SYSTEMS:   Constitutional: Denies fevers, chills or abnormal night sweats Eyes: Denies blurriness of vision, double vision or watery eyes Ears, nose, mouth, throat, and face: Denies mucositis or sore throat Respiratory: Denies cough, dyspnea or wheezes Cardiovascular: Denies palpitation, chest discomfort or lower extremity swelling Gastrointestinal:  Denies nausea, heartburn or change in bowel habits Skin: Denies abnormal skin rashes Lymphatics: Denies new lymphadenopathy or easy bruising Neurological:Denies numbness, tingling or new weaknesses Behavioral/Psych: Mood is stable, no new changes  All other systems were reviewed with the patient and are negative.  MEDICAL HISTORY:  Past Medical History:  Diagnosis Date  . Chronic back pain   . Diverticulosis    a. s/p partial colectomy 2005 for anastamotic leak with reversal.  . Hyperlipidemia   . Hypertension   . Normal cardiac stress test 2015   low risk  . Obesity   . Sciatica, left side   . Tobacco abuse     SURGICAL HISTORY: Past Surgical  History:  Procedure Laterality Date  . COLOSTOMY REVERSAL    . KNEE ARTHROSCOPY Right   . KNEE ARTHROSCOPY Right 01/20/2013   Procedure: ARTHROSCOPY KNEE;  Surgeon: Darreld Mclean, MD;  Location: [facility];  Service: Orthopedics;  Laterality: Right;  . MENISECTOMY Right 01/20/2013   Procedure: partial medial menisectomy;  Surgeon: Darreld Mclean, MD;  Location: [facility];  Service: Orthopedics;  Laterality: Right;  . PARTIAL COLECTOMY     2 subsequent surgeries to reverse his  colostomy-diverticulosis  . WISDOM TOOTH EXTRACTION      SOCIAL HISTORY: Social History   Socioeconomic History  . Marital status: Married    Spouse name: Not on file  . Number of children: Not on file  . Years of education: Not on file  . Highest education level: Not on file  Occupational History  . Not on file  Tobacco Use  . Smoking status: Current Every Day Smoker    Packs/day: 1.00    Years: 25.00    Pack years: 25.00    Types: Cigarettes  . Smokeless tobacco: Former Engineer, water and Sexual Activity  . Alcohol use: Yes    Comment: On occasion, 1 or 2 beers per month  . Drug use: No  . Sexual activity: Yes    Birth control/protection: None  Other Topics Concern  . Not on file  Social History Narrative  . Not on file   Social Determinants of Health   Financial Resource Strain: Low Risk   . Difficulty of Paying Living Expenses: Not hard at all  Food Insecurity: No Food Insecurity  . Worried About Programme researcher, broadcasting/film/video in the Last Year: Never true  . Ran Out of Food in the Last Year: Never true  Transportation Needs: No Transportation Needs  . Lack of Transportation (Medical): No  . Lack of Transportation (Non-Medical): No  Physical Activity: Sufficiently Active  . Days of Exercise per Week: 3 days  . Minutes of Exercise per Session: 60 min  Stress: No Stress Concern Present  . Feeling of Stress : Not at all  Social Connections: Moderately Isolated  . Frequency of Communication with Friends and Family: More than three times a week  . Frequency of Social Gatherings with Friends and Family: More than three times a week  . Attends Religious Services: Never  . Active Member of Clubs or Organizations: No  . Attends Banker Meetings: Never  . Marital Status: Married  Catering manager Violence: Not At Risk  . Fear of Current or Ex-Partner: No  . Emotionally Abused: No  . Physically Abused: No  . Sexually Abused: No    FAMILY HISTORY: Family  History  Problem Relation Age of Onset  . Hypertension Mother   . Diverticulosis Mother   . Sleep apnea Mother   . Celiac disease Mother   . Ulcers Mother   . Cancer Father        Brain  . Diverticulosis Brother   . Ulcers Brother   . Celiac disease Brother   . Hypotension Brother   . Cancer Maternal Grandmother        Breast  . Ulcers Maternal Grandfather   . ALS Maternal Grandfather   . Diabetes Maternal Grandfather   . Heart attack Paternal Grandmother   . Hypertension Paternal Grandmother   . Cancer Paternal Grandfather        Melanoma  . Diverticulosis Daughter     ALLERGIES:  is allergic to penicillins.  MEDICATIONS:  Current  Outpatient Medications  Medication Sig Dispense Refill  . amLODipine (NORVASC) 5 MG tablet TAKE 1 TABLET BY MOUTH EVERY DAY 30 tablet 11  . carvedilol (COREG) 25 MG tablet TAKE 1 TABLET BY MOUTH TWICE A DAY 60 tablet 11   No current facility-administered medications for this visit.     PHYSICAL EXAMINATION: ECOG PERFORMANCE STATUS: 2 - Symptomatic, <50% confined to bed   ECOG limited by his back pain mostly  Vitals:   02/20/20 0839  BP: (!) 138/93  Pulse: 83  Resp: 16  Temp: 98.9 F (37.2 C)  SpO2: 97%   Filed Weights   02/20/20 0839  Weight: 236 lb 4.8 oz (107.2 kg)    GENERAL:alert, no distress and comfortable SKIN: skin color, texture, turgor are normal, no rashes or significant lesions EYES: normal, conjunctiva are pink and non-injected, sclera clear OROPHARYNX:no exudate, no erythema and lips, buccal mucosa, and tongue normal  NECK: supple, thyroid normal size, non-tender, without nodularity LYMPH:  no palpable lymphadenopathy in the cervical, axillary or inguinal LUNGS: clear to auscultation and percussion with normal breathing effort HEART: regular rate & rhythm and no murmurs and no lower extremity edema ABDOMEN:abdomen soft, non-tender and normal bowel sounds Musculoskeletal:no cyanosis of digits and no clubbing   PSYCH: alert & oriented x 3 with fluent speech NEURO: no focal motor/sensory deficits  Skin: small abrasions, bruises noted on the forearms, no major bruises for me today  LABORATORY DATA:  I have reviewed the data as listed Lab Results  Component Value Date   WBC 10.5 10/08/2019   HGB 16.5 10/08/2019   HCT 49.0 10/08/2019   MCV 88 10/08/2019   PLT 259 10/08/2019     Chemistry      Component Value Date/Time   NA 140 10/08/2019 1512   K 4.5 10/08/2019 1512   CL 102 10/08/2019 1512   CO2 27 10/08/2019 1512   BUN 10 10/08/2019 1512   CREATININE 0.65 (L) 10/08/2019 1512      Component Value Date/Time   CALCIUM 9.7 10/08/2019 1512   ALKPHOS 77 10/08/2019 1512   AST 23 10/08/2019 1512   ALT 22 10/08/2019 1512   BILITOT 0.5 10/08/2019 1512     CBC normal.  RADIOGRAPHIC STUDIES: I have personally reviewed the radiological images as listed and agreed with the findings in the report. No results found.  All questions were answered. The patient knows to call the clinic with any problems, questions or concerns. I spent 45 minutes in the care of this patient including H and P, review of records, counseling and coordination of care.     Rachel Moulds, MD 02/20/2020 9:35 AM

## 2020-02-20 ENCOUNTER — Other Ambulatory Visit: Payer: Self-pay

## 2020-02-20 ENCOUNTER — Encounter (HOSPITAL_COMMUNITY): Payer: Self-pay | Admitting: Hematology and Oncology

## 2020-02-20 ENCOUNTER — Encounter (HOSPITAL_COMMUNITY): Payer: Self-pay | Admitting: *Deleted

## 2020-02-20 ENCOUNTER — Inpatient Hospital Stay (HOSPITAL_COMMUNITY): Payer: 59

## 2020-02-20 ENCOUNTER — Inpatient Hospital Stay (HOSPITAL_COMMUNITY): Payer: 59 | Attending: Hematology and Oncology | Admitting: Hematology and Oncology

## 2020-02-20 VITALS — BP 138/93 | HR 83 | Temp 98.9°F | Resp 16 | Ht 73.0 in | Wt 236.3 lb

## 2020-02-20 DIAGNOSIS — Z803 Family history of malignant neoplasm of breast: Secondary | ICD-10-CM | POA: Insufficient documentation

## 2020-02-20 DIAGNOSIS — M549 Dorsalgia, unspecified: Secondary | ICD-10-CM | POA: Insufficient documentation

## 2020-02-20 DIAGNOSIS — R233 Spontaneous ecchymoses: Secondary | ICD-10-CM

## 2020-02-20 DIAGNOSIS — F17210 Nicotine dependence, cigarettes, uncomplicated: Secondary | ICD-10-CM | POA: Insufficient documentation

## 2020-02-20 DIAGNOSIS — Z808 Family history of malignant neoplasm of other organs or systems: Secondary | ICD-10-CM | POA: Insufficient documentation

## 2020-02-20 DIAGNOSIS — R238 Other skin changes: Secondary | ICD-10-CM | POA: Diagnosis present

## 2020-02-20 LAB — PLATELET FUNCTION ASSAY: Collagen / Epinephrine: 181 seconds (ref 0–193)

## 2020-02-20 LAB — COMPREHENSIVE METABOLIC PANEL
ALT: 26 U/L (ref 0–44)
AST: 27 U/L (ref 15–41)
Albumin: 4.3 g/dL (ref 3.5–5.0)
Alkaline Phosphatase: 67 U/L (ref 38–126)
Anion gap: 7 (ref 5–15)
BUN: 13 mg/dL (ref 6–20)
CO2: 29 mmol/L (ref 22–32)
Calcium: 9.3 mg/dL (ref 8.9–10.3)
Chloride: 102 mmol/L (ref 98–111)
Creatinine, Ser: 0.81 mg/dL (ref 0.61–1.24)
GFR, Estimated: >60 mL/min (ref >60)
Glucose, Bld: 93 mg/dL (ref 70–99)
Potassium: 3.9 mmol/L (ref 3.5–5.1)
Sodium: 138 mmol/L (ref 135–145)
Total Bilirubin: 0.8 mg/dL (ref 0.3–1.2)
Total Protein: 7.6 g/dL (ref 6.5–8.1)

## 2020-02-20 LAB — CBC WITH DIFFERENTIAL/PLATELET
Abs Immature Granulocytes: 0.05 10*3/uL (ref 0.00–0.07)
Basophils Absolute: 0.1 10*3/uL (ref 0.0–0.1)
Basophils Relative: 1 %
Eosinophils Absolute: 0.1 10*3/uL (ref 0.0–0.5)
Eosinophils Relative: 2 %
HCT: 50.6 % (ref 39.0–52.0)
Hemoglobin: 16.7 g/dL (ref 13.0–17.0)
Immature Granulocytes: 1 %
Lymphocytes Relative: 27 %
Lymphs Abs: 2.4 10*3/uL (ref 0.7–4.0)
MCH: 30.9 pg (ref 26.0–34.0)
MCHC: 33 g/dL (ref 30.0–36.0)
MCV: 93.7 fL (ref 80.0–100.0)
Monocytes Absolute: 0.5 10*3/uL (ref 0.1–1.0)
Monocytes Relative: 6 %
Neutro Abs: 5.9 10*3/uL (ref 1.7–7.7)
Neutrophils Relative %: 63 %
Platelets: 267 10*3/uL (ref 150–400)
RBC: 5.4 MIL/uL (ref 4.22–5.81)
RDW: 12.7 % (ref 11.5–15.5)
WBC: 9.2 10*3/uL (ref 4.0–10.5)
nRBC: 0 % (ref 0.0–0.2)

## 2020-02-20 LAB — APTT: aPTT: 29 seconds (ref 24–36)

## 2020-02-20 LAB — PROTIME-INR
INR: 1 (ref 0.8–1.2)
Prothrombin Time: 12.7 seconds (ref 11.4–15.2)

## 2020-02-20 NOTE — Progress Notes (Signed)
no need for follow up per Dr. Al Pimple.  Patient is aware.

## 2020-02-23 LAB — PATHOLOGIST SMEAR REVIEW

## 2020-03-12 ENCOUNTER — Ambulatory Visit (HOSPITAL_COMMUNITY): Payer: 59 | Admitting: Hematology and Oncology

## 2020-11-04 ENCOUNTER — Other Ambulatory Visit: Payer: Self-pay | Admitting: Cardiovascular Disease

## 2020-12-01 ENCOUNTER — Other Ambulatory Visit: Payer: Self-pay | Admitting: Cardiovascular Disease

## 2020-12-16 ENCOUNTER — Other Ambulatory Visit: Payer: Self-pay

## 2020-12-16 ENCOUNTER — Ambulatory Visit (INDEPENDENT_AMBULATORY_CARE_PROVIDER_SITE_OTHER): Payer: 59 | Admitting: Orthopaedic Surgery

## 2020-12-16 ENCOUNTER — Ambulatory Visit: Payer: Self-pay

## 2020-12-16 ENCOUNTER — Encounter: Payer: Self-pay | Admitting: Orthopaedic Surgery

## 2020-12-16 VITALS — Ht 73.0 in | Wt 235.0 lb

## 2020-12-16 DIAGNOSIS — G8929 Other chronic pain: Secondary | ICD-10-CM | POA: Diagnosis not present

## 2020-12-16 DIAGNOSIS — M545 Low back pain, unspecified: Secondary | ICD-10-CM | POA: Diagnosis not present

## 2020-12-16 DIAGNOSIS — M1712 Unilateral primary osteoarthritis, left knee: Secondary | ICD-10-CM

## 2020-12-16 DIAGNOSIS — M25562 Pain in left knee: Secondary | ICD-10-CM | POA: Diagnosis not present

## 2020-12-16 NOTE — Progress Notes (Signed)
Office Visit Note   Patient: Brent Nguyen           Date of Birth: 1966-01-02           MRN: [id number] Visit Date: 12/16/2020              Requested by: Pearson Grippe, MD [address] [place],  [place] [address] PCP: Pearson Grippe, MD   Assessment & Plan: Visit Diagnoses:  1. Chronic pain of left knee   2. Chronic bilateral low back pain, unspecified whether sciatica present   3. Unilateral primary osteoarthritis, left knee     Plan: X-ray results reviewed.  Lumbar images showed good hardware position with no loosening.  He does have some arthritis in his knee with subchondral cyst formation medial femoral condyle which corresponds with the MRI report of his left knee.  He understands at some point he may require left knee arthroplasty.  I discussed with him which unsure if a spinal cord stimulator will help with some of the intermittent crawling type sensation he gets in his lumbar spine that last for few seconds.  His principal problem now has been night catching and sharp pain in his left knee at times with walking activity and I discussed with him this is likely related to the area of arthritis in the medial compartment as described in the MRI scan.  Follow-Up Instructions: No follow-ups on file.   Orders:  Orders Placed This Encounter  Procedures   XR Knee 1-2 Views Left   XR Lumbar Spine 2-3 Views   No orders of the defined types were placed in this encounter.     Procedures: No procedures performed   Clinical Data: No additional findings.   Subjective: Chief Complaint  Patient presents with   Left Knee - Pain    HPI 55 year old male with a complex history.  Patient lives in [place] 2 years ago he had a back injury while working for [employer] and had to have surgery.  He had a two-level instrumented fusion without interbody cages.  He states while he was out he fell injuring his left knee and had knee arthroscopy June 2021 with a meniscal tear this  was done in [place].  Patient has an Pensions consultant.  He states he is applied for disability.  States he has more problems with his knee occasionally feels like it will give way or has sharp pain and has more pain medial than lateral in the knee.  He has had pain and swelling in his knee states it sensitive medially and occasionally feels unsteady sometimes with walking and feels like it will grab.  He has used a knee sleeve.  He has had 11 cortisone shots in 1 year 5 in his back and 6 in his knee.  He has been through therapy.  He states currently they have recommended a nerve stimulator since he occasionally feels sensation of something crawling up his back but of short duration and not really pain.  Patient states he was fired once he was told he needed a second surgery.  Patient bowel bladder symptoms no chills or fever arthroscopic portals and medial lumbar incision is healed.  Patient denies any specific pain in his back and denies classic neurogenic claudication symptoms.  Patient states he is here he wants additional opinions about his knee and his back, previous lumbar fusion, spinal cord stimulator recommendations.  Review of Systems positive for history of smoking hyperlipidemia hypertension.  History of diverticulitis  with partial colectomy with later reversal.  All other systems noncontributory to HPI.   Objective: Vital Signs: Ht 6\' 1"  (1.854 m)   Wt 235 lb (106.6 kg)   BMI 31.00 kg/m   Physical Exam Constitutional:      Appearance: He is well-developed.  HENT:     Head: Normocephalic and atraumatic.     Right Ear: External ear normal.     Left Ear: External ear normal.  Eyes:     Pupils: Pupils are equal, round, and reactive to light.  Neck:     Thyroid: No thyromegaly.     Trachea: No tracheal deviation.  Cardiovascular:     Rate and Rhythm: Normal rate.  Pulmonary:     Effort: Pulmonary effort is normal.     Breath sounds: No wheezing.  Abdominal:     General: Bowel  sounds are normal.     Palpations: Abdomen is soft.  Musculoskeletal:     Cervical back: Neck supple.  Skin:    General: Skin is warm and dry.     Capillary Refill: Capillary refill takes less than 2 seconds.  Neurological:     Mental Status: He is alert and oriented to person, place, and time.  Psychiatric:        Behavior: Behavior normal.        Thought Content: Thought content normal.        Judgment: Judgment normal.    Ortho Exam patient is able to heel and toe walk.  Has some discomfort straight leg raising at 90 degrees.  Left knee medial joint line tenderness collateral ligaments are stable he is amatory with a slight left knee limp.  No trochanteric bursal tenderness.  Specialty Comments:  No specialty comments available.  Imaging: No results found.   PMFS History: Patient Active Problem List   Diagnosis Date Noted   Unilateral primary osteoarthritis, left knee 12/17/2020   Chest pain 05/17/2013   Palpitations 05/17/2013   Hyperlipidemia 04/25/2013   Diverticulosis s/p partial colectomy with reversal    HTN (hypertension), severe 09/17/2012   Tobacco abuse 09/17/2012   Obesity (BMI 30-39.9) 09/17/2012   Past Medical History:  Diagnosis Date   Chronic back pain    Diverticulosis    a. s/p partial colectomy 2005 for anastamotic leak with reversal.   Hyperlipidemia    Hypertension    Normal cardiac stress test 2015   low risk   Obesity    Sciatica, left side    Tobacco abuse     Family History  Problem Relation Age of Onset   Hypertension Mother    Diverticulosis Mother    Sleep apnea Mother    Celiac disease Mother    Ulcers Mother    Cancer Father        Brain   Diverticulosis Brother    Ulcers Brother    Celiac disease Brother    Hypotension Brother    Cancer Maternal Grandmother        Breast   Ulcers Maternal Grandfather    ALS Maternal Grandfather    Diabetes Maternal Grandfather    Heart attack Paternal Grandmother    Hypertension  Paternal Grandmother    Cancer Paternal Grandfather        Melanoma   Diverticulosis Daughter     Past Surgical History:  Procedure Laterality Date   COLOSTOMY REVERSAL     KNEE ARTHROSCOPY Right    KNEE ARTHROSCOPY Right 01/20/2013   Procedure: ARTHROSCOPY KNEE;  Surgeon: 01/22/2013  Hilda Lias, MD;  Location: [facility];  Service: Orthopedics;  Laterality: Right;   MENISECTOMY Right 01/20/2013   Procedure: partial medial menisectomy;  Surgeon: Darreld Mclean, MD;  Location: [facility];  Service: Orthopedics;  Laterality: Right;   PARTIAL COLECTOMY     2 subsequent surgeries to reverse his colostomy-diverticulosis   WISDOM TOOTH EXTRACTION     Social History   Occupational History   Not on file  Tobacco Use   Smoking status: Every Day    Packs/day: 1.00    Years: 25.00    Pack years: 25.00    Types: Cigarettes   Smokeless tobacco: Former  Substance and Sexual Activity   Alcohol use: Yes    Comment: On occasion, 1 or 2 beers per month   Drug use: No   Sexual activity: Yes    Birth control/protection: None

## 2020-12-17 DIAGNOSIS — M1712 Unilateral primary osteoarthritis, left knee: Secondary | ICD-10-CM | POA: Insufficient documentation

## 2020-12-28 ENCOUNTER — Other Ambulatory Visit: Payer: Self-pay | Admitting: Cardiovascular Disease

## 2021-01-04 ENCOUNTER — Telehealth: Payer: Self-pay | Admitting: Radiology

## 2021-01-04 NOTE — Telephone Encounter (Signed)
Tried to reach patient, no answer.  Message also sent back to Brent Nguyen in [place] office to advise if patient calls back there.

## 2021-01-04 NOTE — Telephone Encounter (Signed)
Please see message from [place] office below and advise.  Pt states Dr Ophelia Charter told him he needs a knee replacement. He would like to get that process started. What is his next step? He can be reached at [phone].

## 2021-01-05 NOTE — Telephone Encounter (Signed)
Patient follow up appt has been scheduled for 12/15 at 11am in [place] office.

## 2021-01-13 ENCOUNTER — Ambulatory Visit (INDEPENDENT_AMBULATORY_CARE_PROVIDER_SITE_OTHER): Payer: 59 | Admitting: Orthopaedic Surgery

## 2021-01-13 ENCOUNTER — Other Ambulatory Visit: Payer: Self-pay

## 2021-01-13 ENCOUNTER — Encounter: Payer: Self-pay | Admitting: Orthopaedic Surgery

## 2021-01-13 VITALS — Ht 73.0 in | Wt 226.0 lb

## 2021-01-13 DIAGNOSIS — M1712 Unilateral primary osteoarthritis, left knee: Secondary | ICD-10-CM | POA: Diagnosis not present

## 2021-01-13 NOTE — Progress Notes (Signed)
Office Visit Note   Patient: Brent Nguyen           Date of Birth: Mar 16, 1965           MRN: [id number] Visit Date: 01/13/2021              Requested by: Pearson Grippe, MD [address] [place],  [place] [address] PCP: Pearson Grippe, MD   Assessment & Plan: Visit Diagnoses:  1. Unilateral primary osteoarthritis, left knee     Plan: Patient states he has been through therapy without improvement in his knee.  He had knee arthroscopy greater than a year ago with persistent giving way symptoms sharp medial joint line pain.  He is taken anti-inflammatories has had 6 injections in his knee without relief.  He states he is progressed the point he like to have his total knee arthroplasty performed we will like to have it done in [place] rather than in [place].  Procedure discussed with overnight stay home therapy if available with his insurance.  Outpatient therapy knee range of motion quad strengthening exercises discussed questions elicited and answered he understands request to proceed.  Follow-Up Instructions: No follow-ups on file.   Orders:  No orders of the defined types were placed in this encounter.  No orders of the defined types were placed in this encounter.     Procedures: No procedures performed   Clinical Data: No additional findings.   Subjective: Chief Complaint  Patient presents with   Left Knee - Pain    HPI 55 year old male returns with ongoing problems with some back pain he had previous two-level fusion and states he had a recent temporary spine cord stimulator which has not helped at all and he has decided not to proceed.  He is also had problems with his left knee with medial compartment arthritis worse than patellofemoral and states he had previous knee arthroscopy over a year ago he has had therapy in the past injections x6 in his left knee and his knee is catching with sharp medial joint line pain bothers him on a daily basis.  Patient is  currently not working.  Plain radiographs show slightly medial femoral condyle cystic changes medial compartment with subchondral sclerosis and marginal osteophyte formation worse in the medial compartment left knee worse than right knee.  Review of Systems past history of tobacco abuse hyperlipidemia diverticulitis.  Lumbar fusion L4-S1 without cages.   Objective: Vital Signs: Ht 6\' 1"  (1.854 m)    Wt 226 lb (102.5 kg)    BMI 29.82 kg/m   Physical Exam Constitutional:      Appearance: He is well-developed.  HENT:     Head: Normocephalic and atraumatic.     Right Ear: External ear normal.     Left Ear: External ear normal.  Eyes:     Pupils: Pupils are equal, round, and reactive to light.  Neck:     Thyroid: No thyromegaly.     Trachea: No tracheal deviation.  Cardiovascular:     Rate and Rhythm: Normal rate.  Pulmonary:     Effort: Pulmonary effort is normal.     Breath sounds: No wheezing.  Abdominal:     General: Bowel sounds are normal.     Palpations: Abdomen is soft.  Musculoskeletal:     Cervical back: Neck supple.  Skin:    General: Skin is warm and dry.     Capillary Refill: Capillary refill takes less than 2 seconds.  Neurological:  Mental Status: He is alert and oriented to person, place, and time.  Psychiatric:        Behavior: Behavior normal.        Thought Content: Thought content normal.        Judgment: Judgment normal.    Ortho Exam patient ambulates with a left knee limp negative logroll the hips pulses are normal.  He has medial joint line tenderness 2+ knee effusion.  Crepitus with patellofemoral loading negative apprehension test with subluxation of the patella.  No lateral joint line tenderness.  Collateral ligaments crucial ligament exam is normal.  Specialty Comments:  No specialty comments available.  Imaging: No results found.   PMFS History: Patient Active Problem List   Diagnosis Date Noted   Unilateral primary osteoarthritis, left  knee 12/17/2020   Chest pain 05/17/2013   Palpitations 05/17/2013   Hyperlipidemia 04/25/2013   Diverticulosis s/p partial colectomy with reversal    HTN (hypertension), severe 09/17/2012   Tobacco abuse 09/17/2012   Obesity (BMI 30-39.9) 09/17/2012   Past Medical History:  Diagnosis Date   Chronic back pain    Diverticulosis    a. s/p partial colectomy 2005 for anastamotic leak with reversal.   Hyperlipidemia    Hypertension    Normal cardiac stress test 2015   low risk   Obesity    Sciatica, left side    Tobacco abuse     Family History  Problem Relation Age of Onset   Hypertension Mother    Diverticulosis Mother    Sleep apnea Mother    Celiac disease Mother    Ulcers Mother    Cancer Father        Brain   Diverticulosis Brother    Ulcers Brother    Celiac disease Brother    Hypotension Brother    Cancer Maternal Grandmother        Breast   Ulcers Maternal Grandfather    ALS Maternal Grandfather    Diabetes Maternal Grandfather    Heart attack Paternal Grandmother    Hypertension Paternal Grandmother    Cancer Paternal Grandfather        Melanoma   Diverticulosis Daughter     Past Surgical History:  Procedure Laterality Date   COLOSTOMY REVERSAL     KNEE ARTHROSCOPY Right    KNEE ARTHROSCOPY Right 01/20/2013   Procedure: ARTHROSCOPY KNEE;  Surgeon: Darreld Mclean, MD;  Location: [facility];  Service: Orthopedics;  Laterality: Right;   MENISECTOMY Right 01/20/2013   Procedure: partial medial menisectomy;  Surgeon: Darreld Mclean, MD;  Location: [facility];  Service: Orthopedics;  Laterality: Right;   PARTIAL COLECTOMY     2 subsequent surgeries to reverse his colostomy-diverticulosis   WISDOM TOOTH EXTRACTION     Social History   Occupational History   Not on file  Tobacco Use   Smoking status: Every Day    Packs/day: 1.00    Years: 25.00    Pack years: 25.00    Types: Cigarettes   Smokeless tobacco: Former  Substance and Sexual Activity   Alcohol use:  Yes    Comment: On occasion, 1 or 2 beers per month   Drug use: No   Sexual activity: Yes    Birth control/protection: None

## 2021-02-10 ENCOUNTER — Ambulatory Visit: Payer: Self-pay

## 2021-02-10 ENCOUNTER — Ambulatory Visit (INDEPENDENT_AMBULATORY_CARE_PROVIDER_SITE_OTHER): Payer: 59 | Admitting: Orthopaedic Surgery

## 2021-02-10 ENCOUNTER — Other Ambulatory Visit: Payer: Self-pay

## 2021-02-10 ENCOUNTER — Encounter: Payer: Self-pay | Admitting: Orthopaedic Surgery

## 2021-02-10 VITALS — Ht 73.0 in | Wt 225.0 lb

## 2021-02-10 DIAGNOSIS — M1712 Unilateral primary osteoarthritis, left knee: Secondary | ICD-10-CM | POA: Diagnosis not present

## 2021-02-10 DIAGNOSIS — M545 Low back pain, unspecified: Secondary | ICD-10-CM

## 2021-02-10 DIAGNOSIS — M25562 Pain in left knee: Secondary | ICD-10-CM

## 2021-02-10 NOTE — Progress Notes (Signed)
Office Visit Note   Patient: Brent Nguyen           Date of Birth: [birth year]           MRN: [id number] Visit Date: 02/10/2021              Requested by: Pearson Grippe, MD [address] [place],  [place] [address] PCP: Pearson Grippe, MD   Assessment & Plan: Visit Diagnoses:  1. Acute pain of left knee   2. Acute bilateral low back pain, unspecified whether sciatica present   3. Unilateral primary osteoarthritis, left knee     Plan: We discussed x-rays demonstrate no acute fracture just severe arthritic changes he had some areas of full-thickness cartilage wear.  He remains pending a left total knee arthroplasty.  He said persistent symptoms that failed to respond to anti-inflammatories intra-articular injections x6. Will check on insurance approval for total joint arthroplasty.  He will continue to use ice cane and elevation. Follow-Up Instructions: No follow-ups on file.   Orders:  Orders Placed This Encounter  Procedures   XR KNEE 3 VIEW LEFT   XR Lumbar Spine 2-3 Views   No orders of the defined types were placed in this encounter.     Procedures: No procedures performed   Clinical Data: No additional findings.   Subjective: Chief Complaint  Patient presents with   Left Knee - Pain    DOI 02/02/2021   Lower Back - Pain    DOI 02/02/2021    HPI 56 year old male with left knee osteoarthritis returns she has been waiting on surgical scheduling for left total knee arthroplasty.  He has subchondral cystic formation sclerotic changes.  He was carrying some cushions for patio furniture which were light slipped and hyperextended his knee with valgus and states he is having severe joint line pain now more laterally.  He noticed increased swelling.  Once he is upright he says he is done okay walking.  He has severe pain of either goes up or down a hill new x-rays again shows bone-on-bone changes medial compartment subchondral cyst formation.  Osteophytes medially and  laterally and less severe patellofemoral degenerative changes.  Review of Systems positive for history of smoking, positive hypertension.   Objective: Vital Signs: Ht 6\' 1"  (1.854 m)    Wt 225 lb (102.1 kg)    BMI 29.69 kg/m   Physical Exam Constitutional:      Appearance: He is well-developed.  HENT:     Head: Normocephalic and atraumatic.     Right Ear: External ear normal.     Left Ear: External ear normal.  Eyes:     Pupils: Pupils are equal, round, and reactive to light.  Neck:     Thyroid: No thyromegaly.     Trachea: No tracheal deviation.  Cardiovascular:     Rate and Rhythm: Normal rate.  Pulmonary:     Effort: Pulmonary effort is normal.     Breath sounds: No wheezing.  Abdominal:     General: Bowel sounds are normal.     Palpations: Abdomen is soft.  Musculoskeletal:     Cervical back: Neck supple.  Skin:    General: Skin is warm and dry.     Capillary Refill: Capillary refill takes less than 2 seconds.  Neurological:     Mental Status: He is alert and oriented to person, place, and time.  Psychiatric:        Behavior: Behavior normal.  Thought Content: Thought content normal.        Judgment: Judgment normal.    Ortho Exam patient has bilateral genu varum mild.  Knee effusion noted left knee tenderness over the anterior joint line.  Pain with hyperextension no locking.  He is able to do a straight leg raise.  Negative logroll to the hips.  Distal pulses are 2+ ACL PCL exam is normal.  Specialty Comments:  No specialty comments available.  Imaging: No results found.   PMFS History: Patient Active Problem List   Diagnosis Date Noted   Unilateral primary osteoarthritis, left knee 12/17/2020   Chest pain 05/17/2013   Palpitations 05/17/2013   Hyperlipidemia 04/25/2013   Diverticulosis s/p partial colectomy with reversal    HTN (hypertension), severe 09/17/2012   Tobacco abuse 09/17/2012   Obesity (BMI 30-39.9) 09/17/2012   Past Medical  History:  Diagnosis Date   Chronic back pain    Diverticulosis    a. s/p partial colectomy 2005 for anastamotic leak with reversal.   Hyperlipidemia    Hypertension    Normal cardiac stress test 2015   low risk   Obesity    Sciatica, left side    Tobacco abuse     Family History  Problem Relation Age of Onset   Hypertension Mother    Diverticulosis Mother    Sleep apnea Mother    Celiac disease Mother    Ulcers Mother    Cancer Father        Brain   Diverticulosis Brother    Ulcers Brother    Celiac disease Brother    Hypotension Brother    Cancer Maternal Grandmother        Breast   Ulcers Maternal Grandfather    ALS Maternal Grandfather    Diabetes Maternal Grandfather    Heart attack Paternal Grandmother    Hypertension Paternal Grandmother    Cancer Paternal Grandfather        Melanoma   Diverticulosis Daughter     Past Surgical History:  Procedure Laterality Date   COLOSTOMY REVERSAL     KNEE ARTHROSCOPY Right    KNEE ARTHROSCOPY Right 01/20/2013   Procedure: ARTHROSCOPY KNEE;  Surgeon: Darreld Mclean, MD;  Location: [facility];  Service: Orthopedics;  Laterality: Right;   MENISECTOMY Right 01/20/2013   Procedure: partial medial menisectomy;  Surgeon: Darreld Mclean, MD;  Location: [facility];  Service: Orthopedics;  Laterality: Right;   PARTIAL COLECTOMY     2 subsequent surgeries to reverse his colostomy-diverticulosis   WISDOM TOOTH EXTRACTION     Social History   Occupational History   Not on file  Tobacco Use   Smoking status: Every Day    Packs/day: 1.00    Years: 25.00    Pack years: 25.00    Types: Cigarettes   Smokeless tobacco: Former  Substance and Sexual Activity   Alcohol use: Yes    Comment: On occasion, 1 or 2 beers per month   Drug use: No   Sexual activity: Yes    Birth control/protection: None

## 2021-03-08 ENCOUNTER — Other Ambulatory Visit: Payer: Self-pay

## 2021-03-28 ENCOUNTER — Other Ambulatory Visit: Payer: Self-pay | Admitting: Cardiovascular Disease

## 2021-03-30 NOTE — Pre-Procedure Instructions (Signed)
Surgical Instructions ? ? ? Your procedure is scheduled on Monday, March 6th. ? Report to [facility] Main Entrance "A" at 8:15 A.M., then check in with the Admitting office. ? Call this number if you have problems the morning of surgery: ? [phone] ? ? If you have any questions prior to your surgery date call [phone]: Open Monday-Friday 8am-4pm ? ? ? Remember: ? Do not eat after midnight the night before your surgery ? ?You may drink clear liquids until 7:15 a.m. the morning of your surgery.   ?Clear liquids allowed are: Water, Non-Citrus Juices (without pulp), Carbonated Beverages, Clear Tea, Black Coffee ONLY (NO MILK, CREAM OR POWDERED CREAMER of any kind), and Gatorade. ? ? ?Enhanced Recovery after Surgery for Orthopedics ?Enhanced Recovery after Surgery is a protocol used to improve the stress on your body and your recovery after surgery. ? ?Patient Instructions ? ?The day of surgery (if you do NOT have diabetes):  ?Drink ONE (1) Pre-Surgery Clear Ensure by 7:15 am the morning of surgery   ?This drink was given to you during your hospital  ?pre-op appointment visit. ?Nothing else to drink after completing the  ?Pre-Surgery Clear Ensure. ? ?       If you have questions, please contact your surgeon?s office. ? ?  ? Take these medicines the morning of surgery with A SIP OF WATER:  ?amLODipine (NORVASC)  ?carvedilol (COREG) ? ?As of today, STOP taking any Aspirin (unless otherwise instructed by your surgeon) Aleve, Naproxen, Ibuprofen, Motrin, Advil, Goody's, BC's, all herbal medications, fish oil, and all vitamins. ? ?         ?Do not wear jewelry or makeup ?Do not wear lotions, powders, colognes, or deodorant. ?Men may shave face and neck. ?Do not bring valuables to the hospital. ? ? ?[facility] is not responsible for any belongings or valuables. .  ? ?Do NOT Smoke (Tobacco/Vaping)  24 hours prior to your procedure ? ?If you use a CPAP at night, you may bring your mask for your overnight stay. ?   ?Contacts, glasses, hearing aids, dentures or partials may not be worn into surgery, please bring cases for these belongings ?  ?For patients admitted to the hospital, discharge time will be determined by your treatment team. ?  ?Patients discharged the day of surgery will not be allowed to drive home, and someone needs to stay with them for 24 hours. ? ?NO VISITORS WILL BE ALLOWED IN PRE-OP WHERE PATIENTS ARE PREPPED FOR SURGERY.  ONLY 1 SUPPORT PERSON MAY BE PRESENT IN THE WAITING ROOM WHILE YOU ARE IN SURGERY.  IF YOU ARE TO BE ADMITTED, ONCE YOU ARE IN YOUR ROOM YOU WILL BE ALLOWED TWO (2) VISITORS. 1 (ONE) VISITOR MAY STAY OVERNIGHT BUT MUST ARRIVE TO THE ROOM BY 8pm.  Minor children may have two parents present. Special consideration for safety and communication needs will be reviewed on a case by case basis. ? ?Special instructions:   ? ?Oral Hygiene is also important to reduce your risk of infection.  Remember - BRUSH YOUR TEETH THE MORNING OF SURGERY WITH YOUR REGULAR TOOTHPASTE ? ? ?[facility]- Preparing For Surgery ? ?Before surgery, you can play an important role. Because skin is not sterile, your skin needs to be as free of germs as possible. You can reduce the number of germs on your skin by washing with CHG (chlorahexidine gluconate) Soap before surgery.  CHG is an antiseptic cleaner which kills germs and bonds with the skin to  continue killing germs even after washing.   ? ? ?Please do not use if you have an allergy to CHG or antibacterial soaps. If your skin becomes reddened/irritated stop using the CHG.  ?Do not shave (including legs and underarms) for at least 48 hours prior to first CHG shower. It is OK to shave your face. ? ?Please follow these instructions carefully. ?  ? ? Shower the NIGHT BEFORE SURGERY and the MORNING OF SURGERY with CHG Soap.  ? If you chose to wash your hair, wash your hair first as usual with your normal shampoo. After you shampoo, rinse your hair and body thoroughly to  remove the shampoo.  Then [employer] and genitals (private parts) with your normal soap and rinse thoroughly to remove soap. ? ?After that Use CHG Soap as you would any other liquid soap. You can apply CHG directly to the skin and wash gently with a scrungie or a clean washcloth.  ? ?Apply the CHG Soap to your body ONLY FROM THE NECK DOWN.  Do not use on open wounds or open sores. Avoid contact with your eyes, ears, mouth and genitals (private parts). Wash Face and genitals (private parts)  with your normal soap.  ? ?Wash thoroughly, paying special attention to the area where your surgery will be performed. ? ?Thoroughly rinse your body with warm water from the neck down. ? ?DO NOT shower/wash with your normal soap after using and rinsing off the CHG Soap. ? ?Pat yourself dry with a CLEAN TOWEL. ? ?Wear CLEAN PAJAMAS to bed the night before surgery ? ?Place CLEAN SHEETS on your bed the night before your surgery ? ?DO NOT SLEEP WITH PETS. ? ? ?Day of Surgery: ? ?Take a shower with CHG soap. ?Wear Clean/Comfortable clothing the morning of surgery ?Do not apply any deodorants/lotions.   ?Remember to brush your teeth WITH YOUR REGULAR TOOTHPASTE. ? ? ? ?COVID testing ? ?If you are going to stay overnight or be admitted after your procedure/surgery and require a pre-op COVID test, please follow these instructions after your COVID test  ? ?You are not required to quarantine however you are required to wear a well-fitting mask when you are out and around people not in your household.  If your mask becomes wet or soiled, replace with a new one. ? ?Wash your hands often with soap and water for 20 seconds or clean your hands with an alcohol-based hand sanitizer that contains at least 60% alcohol. ? ?Do not share personal items. ? ?Notify your provider: ?if you are in close contact with someone who has COVID  ?or if you develop a fever of 100.4 or greater, sneezing, cough, sore throat, shortness of breath or body aches. ? ?   ?Please read over the following fact sheets that you were given.  ? ?

## 2021-03-31 ENCOUNTER — Ambulatory Visit (INDEPENDENT_AMBULATORY_CARE_PROVIDER_SITE_OTHER): Payer: 59 | Admitting: Surgery

## 2021-03-31 ENCOUNTER — Other Ambulatory Visit: Payer: Self-pay | Admitting: Cardiovascular Disease

## 2021-03-31 ENCOUNTER — Encounter (HOSPITAL_COMMUNITY): Payer: Self-pay

## 2021-03-31 ENCOUNTER — Encounter: Payer: Self-pay | Admitting: Surgery

## 2021-03-31 ENCOUNTER — Encounter (HOSPITAL_COMMUNITY)
Admission: RE | Admit: 2021-03-31 | Discharge: 2021-03-31 | Disposition: A | Discharge status: Home / Self Care | Payer: 59 | Source: Ambulatory Visit | Attending: Orthopaedic Surgery | Admitting: Orthopaedic Surgery

## 2021-03-31 ENCOUNTER — Telehealth: Payer: Self-pay | Admitting: *Deleted

## 2021-03-31 ENCOUNTER — Other Ambulatory Visit: Payer: Self-pay

## 2021-03-31 ENCOUNTER — Ambulatory Visit: Payer: 59 | Admitting: Cardiovascular Disease

## 2021-03-31 VITALS — BP 144/97 | HR 71 | Temp 98.6°F | Resp 18 | Ht 73.0 in | Wt 229.3 lb

## 2021-03-31 VITALS — BP 150/108 | HR 80 | Ht 73.0 in | Wt 225.0 lb

## 2021-03-31 DIAGNOSIS — Z01818 Encounter for other preprocedural examination: Secondary | ICD-10-CM | POA: Insufficient documentation

## 2021-03-31 DIAGNOSIS — I10 Essential (primary) hypertension: Secondary | ICD-10-CM | POA: Insufficient documentation

## 2021-03-31 DIAGNOSIS — Z9049 Acquired absence of other specified parts of digestive tract: Secondary | ICD-10-CM | POA: Insufficient documentation

## 2021-03-31 DIAGNOSIS — M549 Dorsalgia, unspecified: Secondary | ICD-10-CM | POA: Diagnosis not present

## 2021-03-31 DIAGNOSIS — E785 Hyperlipidemia, unspecified: Secondary | ICD-10-CM | POA: Diagnosis not present

## 2021-03-31 DIAGNOSIS — Z981 Arthrodesis status: Secondary | ICD-10-CM | POA: Insufficient documentation

## 2021-03-31 DIAGNOSIS — Z20822 Contact with and (suspected) exposure to covid-19: Secondary | ICD-10-CM | POA: Diagnosis not present

## 2021-03-31 DIAGNOSIS — M1712 Unilateral primary osteoarthritis, left knee: Secondary | ICD-10-CM | POA: Insufficient documentation

## 2021-03-31 DIAGNOSIS — Z87891 Personal history of nicotine dependence: Secondary | ICD-10-CM | POA: Diagnosis not present

## 2021-03-31 DIAGNOSIS — M25562 Pain in left knee: Secondary | ICD-10-CM

## 2021-03-31 DIAGNOSIS — G8929 Other chronic pain: Secondary | ICD-10-CM | POA: Insufficient documentation

## 2021-03-31 HISTORY — DX: Personal history of urinary calculi: Z87.442

## 2021-03-31 LAB — COMPREHENSIVE METABOLIC PANEL
ALT: 20 U/L (ref 0–44)
AST: 26 U/L (ref 15–41)
Albumin: 4.1 g/dL (ref 3.5–5.0)
Alkaline Phosphatase: 62 U/L (ref 38–126)
Anion gap: 6 (ref 5–15)
BUN: 6 mg/dL (ref 6–20)
CO2: 31 mmol/L (ref 22–32)
Calcium: 9.1 mg/dL (ref 8.9–10.3)
Chloride: 102 mmol/L (ref 98–111)
Creatinine, Ser: 0.81 mg/dL (ref 0.61–1.24)
GFR, Estimated: >60 mL/min (ref >60)
Glucose, Bld: 96 mg/dL (ref 70–99)
Potassium: 3.9 mmol/L (ref 3.5–5.1)
Sodium: 139 mmol/L (ref 135–145)
Total Bilirubin: 0.8 mg/dL (ref 0.3–1.2)
Total Protein: 7.1 g/dL (ref 6.5–8.1)

## 2021-03-31 LAB — SARS CORONAVIRUS 2 BY RT PCR (HOSPITAL ORDER, PERFORMED IN ~~LOC~~ HOSPITAL LAB): SARS Coronavirus 2: NEGATIVE

## 2021-03-31 LAB — CBC
HCT: 47.5 % (ref 39.0–52.0)
Hemoglobin: 15.8 g/dL (ref 13.0–17.0)
MCH: 29.9 pg (ref 26.0–34.0)
MCHC: 33.3 g/dL (ref 30.0–36.0)
MCV: 89.8 fL (ref 80.0–100.0)
Platelets: 275 10*3/uL (ref 150–400)
RBC: 5.29 MIL/uL (ref 4.22–5.81)
RDW: 13 % (ref 11.5–15.5)
WBC: 8.3 10*3/uL (ref 4.0–10.5)
nRBC: 0 % (ref 0.0–0.2)

## 2021-03-31 LAB — SURGICAL PCR SCREEN
MRSA, PCR: NEGATIVE
Staphylococcus aureus: NEGATIVE

## 2021-03-31 MED ORDER — CARVEDILOL 25 MG PO TABS: 25.0000 mg | ORAL_TABLET | Freq: Two times a day (BID) | ORAL | 3 refills | Status: DC

## 2021-03-31 MED ORDER — AMLODIPINE BESYLATE 5 MG PO TABS: 10.0000 mg | ORAL_TABLET | Freq: Every day | ORAL | 3 refills | Status: DC

## 2021-03-31 MED ORDER — BUPIVACAINE LIPOSOME 1.3 % IJ SUSP
20.0000 mL | Freq: Once | INTRAMUSCULAR | Status: DC
  Filled 2021-03-31: qty 20

## 2021-03-31 NOTE — Progress Notes (Addendum)
PCP: Pearson Grippe, MD ?Cardiologist: Thurmon Fair, MD ? ?EKG:03/31/21 ?CXR: na ?ECHO: 05/11/16 ?Stress Test: 2015 - normal ?Cardiac Cath: denies ? ?Fasting Blood Sugar-na ?Checks Blood Sugar__0_ times a day ? ?ASA/Blood Thinner: No ? ?OSA/CPAP: No ? ?Covid test 03/31/21 at PAT ? ?Anesthesia Review: Yes, abnormal EKG ? ?Patient denies shortness of breath, fever, cough, and chest pain at PAT appointment. ? ?Patient verbalized understanding of instructions provided today at the PAT appointment.  Patient asked to review instructions at home and day of surgery.   ?

## 2021-03-31 NOTE — Progress Notes (Deleted)
? ?No chief complaint on file. ? ? ?  ?History of Present Illness: 56 yo male with history of HTN, HLD, tobacco abuse and obesity ? ?Primary Care Physician: Pearson Grippe, MD ? ? ?Past Medical History:  ?Diagnosis Date  ? Chronic back pain   ? Diverticulosis   ? a. s/p partial colectomy 2005 for anastamotic leak with reversal.  ? History of kidney stones   ? Hyperlipidemia   ? Hypertension   ? Normal cardiac stress test 2015  ? low risk  ? Obesity   ? Sciatica, left side   ? Tobacco abuse   ? ? ?Past Surgical History:  ?Procedure Laterality Date  ? COLOSTOMY REVERSAL    ? KNEE ARTHROSCOPY Right   ? KNEE ARTHROSCOPY Right 01/20/2013  ? Procedure: ARTHROSCOPY KNEE;  Surgeon: Darreld Mclean, MD;  Location: [facility];  Service: Orthopedics;  Laterality: Right;  ? MENISECTOMY Right 01/20/2013  ? Procedure: partial medial menisectomy;  Surgeon: Darreld Mclean, MD;  Location: [facility];  Service: Orthopedics;  Laterality: Right;  ? PARTIAL COLECTOMY    ? 2 subsequent surgeries to reverse his colostomy-diverticulosis  ? WISDOM TOOTH EXTRACTION    ? ? ?Current Outpatient Medications  ?Medication Sig Dispense Refill  ? amLODipine (NORVASC) 5 MG tablet Take 1 tablet (5 mg total) by mouth daily. PATIENT MUST MAKE APPOINTMENT FOR FUTURE REFILLS. THIRD ATTEMPT 15 tablet 2  ? carvedilol (COREG) 25 MG tablet TAKE 1 TABLET BY MOUTH TWICE A DAY (Patient taking differently: Take 25 mg by mouth daily.) 60 tablet 11  ? ?No current facility-administered medications for this visit.  ? ?Facility-Administered Medications Ordered in Other Visits  ?Medication Dose Route Frequency Provider Last Rate Last Admin  ? bupivacaine liposome (EXPAREL) 1.3 % injection 266 mg  20 mL Infiltration Once Naida Sleight, PA-C      ? ? ?Allergies  ?Allergen Reactions  ? Penicillins Other (See Comments)  ?  Childhood reaction, caused urticaria   ? ? ?Social History  ? ?Socioeconomic History  ? Marital status: Married  ?  Spouse name: Not on file  ? Number of children:  Not on file  ? Years of education: Not on file  ? Highest education level: Not on file  ?Occupational History  ? Not on file  ?Tobacco Use  ? Smoking status: Every Day  ?  Packs/day: 1.00  ?  Years: 25.00  ?  Pack years: 25.00  ?  Types: Cigarettes  ? Smokeless tobacco: Former  ?Vaping Use  ? Vaping Use: Some days  ? Substances: THC  ?Substance and Sexual Activity  ? Alcohol use: Yes  ?  Comment: On occasion, 1 or 2 beers per month  ? Drug use: No  ? Sexual activity: Yes  ?  Birth control/protection: None  ?Other Topics Concern  ? Not on file  ?Social History Narrative  ? Not on file  ? ?Social Determinants of Health  ? ?Financial Resource Strain: Not on file  ?Food Insecurity: Not on file  ?Transportation Needs: Not on file  ?Physical Activity: Not on file  ?Stress: Not on file  ?Social Connections: Not on file  ?Intimate Partner Violence: Not on file  ? ? ?Family History  ?Problem Relation Age of Onset  ? Hypertension Mother   ? Diverticulosis Mother   ? Sleep apnea Mother   ? Celiac disease Mother   ? Ulcers Mother   ? Cancer Father   ?     Brain  ? Diverticulosis Brother   ?  Ulcers Brother   ? Celiac disease Brother   ? Hypotension Brother   ? Cancer Maternal Grandmother   ?     Breast  ? Ulcers Maternal Grandfather   ? ALS Maternal Grandfather   ? Diabetes Maternal Grandfather   ? Heart attack Paternal Grandmother   ? Hypertension Paternal Grandmother   ? Cancer Paternal Grandfather   ?     Melanoma  ? Diverticulosis Daughter   ? ? ?Review of Systems:  As stated in the HPI and otherwise negative.  ? ?There were no vitals taken for this visit. ? ?Physical Examination: ?General: Well developed, well nourished, NAD  ?HEENT: OP clear, mucus membranes moist  ?SKIN: warm, dry. No rashes. ?Neuro: No focal deficits  ?Musculoskeletal: Muscle strength 5/5 all ext  ?Psychiatric: Mood and affect normal  ?Neck: No JVD, no carotid bruits, no thyromegaly, no lymphadenopathy.  ?Lungs:Clear bilaterally, no wheezes, rhonci,  crackles ?Cardiovascular: Regular rate and rhythm. No murmurs, gallops or rubs. ?Abdomen:Soft. Bowel sounds present. Non-tender.  ?Extremities: No lower extremity edema. Pulses are 2 + in the bilateral DP/PT. ? ?EKG:  EKG {ACTION; IS/IS IBB:04888916} ordered today. ?The ekg ordered today demonstrates *** ? ?Recent Labs: ?03/31/2021: ALT 20; BUN 6; Creatinine, Ser 0.81; Hemoglobin 15.8; Platelets 275; Potassium 3.9; Sodium 139  ? ?Lipid Panel ?   ?Component Value Date/Time  ? CHOL 174 10/08/2019 1512  ? TRIG 74 10/08/2019 1512  ? HDL 43 10/08/2019 1512  ? CHOLHDL 4.0 10/08/2019 1512  ? LDLCALC 117 (H) 10/08/2019 1512  ? ?  ?Wt Readings from Last 3 Encounters:  ?03/31/21 229 lb 4.8 oz (104 kg)  ?03/31/21 225 lb (102.1 kg)  ?02/10/21 225 lb (102.1 kg)  ?  ? ?Other studies Reviewed: ?Additional studies/ records that were reviewed today include: ***. ?Review of the above records demonstrates: *** ? ? ?Assessment and Plan:  ? ?1.  ? ?Current medicines are reviewed at length with the patient today.  The patient {ACTIONS; HAS/DOES NOT HAVE:19233} concerns regarding medicines. ? ?The following changes have been made:  {PLAN; NO CHANGE:13088:s} ? ?Labs/ tests ordered today include: *** ?No orders of the defined types were placed in this encounter. ? ? ? ?Disposition:   F/U with *** in {gen number [id number]} {TIME; UNITS DAY/WEEK/MONTH:19136} ? ? ?Signed, ?Verne Carrow, MD ?03/31/2021 2:57 PM    ?[facility] ?[address] [place], [place], [place]  [address] ?Phone: [phone]; Fax: [phone]  ? ? ?

## 2021-03-31 NOTE — Telephone Encounter (Signed)
Per Dr. Sallyanne Kuster:  I have been in touch with Dr. Lorin Mercy regarding this patient's surgery and he should be all set to go.  I took care of adjusting blood pressure meds, reviewing the EKG and clearing him for surgery.  Does not need to have the appointment with Dr. Angelena Form today. ? ?I then called the Brent Nguyen and advised him that he does not need to come in today for the appt that we scheduled. Brent Nguyen states he is so very grateful for all the hard work we all did in order to be sure he was cleared for his surgery. I will forward these notes to surgeon. I will also send to Dr. Sallyanne Kuster only as an [facility].  ?

## 2021-03-31 NOTE — Telephone Encounter (Signed)
? ?  Pre-operative Risk Assessment  ?  ?Patient Name: Brent Nguyen  ?DOB: [birth year] ?MRN: [id number]  ? ?  ? ?Request for Surgical Clearance   ? ?Procedure:   LEFT TOTAL KNEE ARTHROPLASTY ? ?Date of Surgery:  Clearance 04/04/21                              ?   ?Surgeon:  DR. MARK YATES ?Surgeon's Group or Practice Name:  [facility] ?Phone number:  [phone] ?Fax number:  [phone] ATTN: [facility] ?  ?Type of Clearance Requested:   ?- Medical  ?  ?Type of Anesthesia:  Spinal ?  ?Additional requests/questions:   ? ?Signed, ?Danielle Rankin   ?03/31/2021, 2:20 PM  ? ?

## 2021-03-31 NOTE — Progress Notes (Signed)
57 year old white male with history of end-stage DJD left knee and pain comes in for preop evaluation.  States that knee symptoms unchanged from previous visit.  He is wanting to proceed with left total knee replacement as scheduled.  Today history and physical performed.  Review of systems negative.  Blood pressure 150/108.  Patient sees cardiologist Dr. Royann Shivers and after reviewing chart looks like he was last seen in that office September 2021.  Patient states that he was scheduled to see him August 2022 but did not keep that appointment.  Documented history of palpitations and hypertension. ? ? ?Plan ?I advised patient that we will need preop cardiac clearance.  He will proceed with getting his preop work-up done at the hospital today.  Our surgery scheduler will send over a form to Dr. Erin Hearing office.  All questions answered. ?

## 2021-03-31 NOTE — Telephone Encounter (Signed)
I called and s/w the pt that he is going to need an appt for pre op clearance. Pt's primary cardiologist is Dr. Royann Shivers. I informed the pt that I have no appts available for him to be seen. I then asked the pt to let me check something else and call him back. I s/w with DOD Dr. Clifton James today. He was gracious to accept to see the pt today at 4:30. I called the pt back and informed him we can see him today if he can be here at [facility] By 4:15 for registration. Pt is very thankful for all of our help in this matter. I will forward notes to MD for appt today. I will let surgeon's office know that we will see the pt today.  ?

## 2021-03-31 NOTE — Progress Notes (Signed)
He presented today for preoperative evaluation for planned knee surgery on Monday, March 6.  I was not able to examine Mr. Brent Nguyen today but did have a conversation with him on the phone. ? ?He has been in a lot of pain because of his back and knee problems.  Pain was particularly bad today when he had to walk from the parking garage to the exam room. ?He checks his blood pressure at home periodically and is consistently in the 140/80's range.  He states that his diastolic blood pressure is never over 100, as it was today. ?He has been compliant with amlodipine 5 mg daily and carvedilol 25 mg twice daily.  He is not taking over-the-counter NSAIDs. ?He does not have any cardiovascular complaints. ?The automatic interpretation of his ECG suggested an old septal infarct, but I disagree with that interpretation.  A small R wave can be seen in lead V1.  His ECG is actually identical with a previous tracing from September 2021.  I do not think there is any evidence of previous myocardial infarction or any active coronary ischemia. ? ?I believe he is at low risk for major cardiovascular complications with the planned knee surgery.  We will increase his amlodipine to 10 mg daily.  Important for him to continue the carvedilol 25 mg twice daily without interruption during the perioperative period.  Avoid salt rich foods.  Avoid NSAIDs. ? ?

## 2021-03-31 NOTE — Anesthesia Preprocedure Evaluation (Addendum)
Anesthesia Evaluation  ?Patient identified by MRN, date of birth, ID band ?Patient awake ? ? ? ?Reviewed: ?Allergy & Precautions, NPO status , Patient's Chart, lab work & pertinent test results ? ?Airway ?Mallampati: II ? ?TM Distance: >3 FB ?Neck ROM: Full ? ? ? Dental ?no notable dental hx. ? ?  ?Pulmonary ?Current Smoker,  ?  ?Pulmonary exam normal ?breath sounds clear to auscultation ? ? ? ? ? ? Cardiovascular ?hypertension, Normal cardiovascular exam ?Rhythm:Regular Rate:Normal ? ? ?  ?Neuro/Psych ?negative neurological ROS ? negative psych ROS  ? GI/Hepatic ?negative GI ROS, Neg liver ROS,   ?Endo/Other  ?negative endocrine ROS ? Renal/GU ?negative Renal ROS  ?negative genitourinary ?  ?Musculoskeletal ? ?(+) Arthritis , Osteoarthritis,   ? Abdominal ?  ?Peds ?negative pediatric ROS ?(+)  Hematology ?negative hematology ROS ?(+)   ?Anesthesia Other Findings ? ? Reproductive/Obstetrics ?negative OB ROS ? ?  ? ? ? ? ? ? ? ? ? ? ? ? ? ?  ?  ? ? ? ? ? ? ? ?Anesthesia Physical ?Anesthesia Plan ? ?ASA: 2 ? ?Anesthesia Plan: Spinal  ? ?Post-op Pain Management: Regional block*  ? ?Induction: Intravenous ? ?PONV Risk Score and Plan: 2 and Ondansetron, Dexamethasone, Propofol infusion, Treatment may vary due to age or medical condition and Midazolam ? ?Airway Management Planned: Simple Face Mask ? ?Additional Equipment:  ? ?Intra-op Plan:  ? ?Post-operative Plan:  ? ?Informed Consent: I have reviewed the patients History and Physical, chart, labs and discussed the procedure including the risks, benefits and alternatives for the proposed anesthesia with the patient or authorized representative who has indicated his/her understanding and acceptance.  ? ? ? ?Dental advisory given ? ?Plan Discussed with: CRNA and Surgeon ? ?Anesthesia Plan Comments: (PAT note written 03/31/2021 by Shonna Chock, PA-C. ?)  ? ? ? ? ? ?Anesthesia Quick Evaluation ? ?

## 2021-03-31 NOTE — Progress Notes (Signed)
Anesthesia Chart Review: ? Case: [id number] Date/Time: 04/04/21 0959  ? Procedure: Left TOTAL KNEE ARTHROPLASTY (Left: Knee)  ? Anesthesia type: Spinal  ? Pre-op diagnosis: Left knee Osteoarthritis  ? [facility]: [facility] ROOM 05 / [facility]  ? Surgeons: Brent Killings, MD  ? ?  ? ? ?DISCUSSION: Patient is a 56 year old male scheduled for the above procedure. ? ?History includes smoking (25 pack years), HTN, HLD, chronic back pain, recurrent diverticulitis (s/p sigmoid resection [id number], complicated by anastomotic leak, s/p exploratory laparotomy, sigmoid resection with colostomy 05/06/03; colostomy takedown ~ 2006), spinal surgery (05/2019 in [place] "two-level instrumented fusion without interbody cages", L4-S1 by 12/16/20 xray), knee arthroscopy (right 01/20/13; left 07/2019 in [place]).   ? ?He has been followed by cardiologist Dr. Sallyanne Nguyen since at least 2014 for HTN, HLD, smoking. He had a virtual visit 10/08/19 with one year follow-up advised. With upcoming left TKA planned, Dr. Lorin Nguyen personally contacted Dr. Sallyanne Nguyen regarding elevated BP and to review 03/31/21 EKG. Dr. Sallyanne Nguyen called and spoke with Brent Nguyen (see Progress note). Home BP ~ 140's/80's on amlodipine 5 mg daily and carvedilol 25 mg BID. He thinks knee pain is driving up his numbers. Amlodipine increased to 10 mg, continue carvedilol as well. He felt overall the EKG was stable. Patient had no CV complaints. Dr. Sallyanne Nguyen wrote, "...I believe he is at low risk for major cardiovascular complications with the planned knee surgery..." ? ?03/31/2021 presurgical COVID-19 test negative.  Anesthesia team to evaluate on the day of surgery. ? ? ?VS: BP (!) 144/97   Pulse 71   Temp 37 ?C (Oral)   Resp 18   Ht 6\' 1"  (1.854 m)   Wt 104 kg   SpO2 99%   BMI 30.25 kg/m?  ? ? ?PROVIDERS: ?Brent Gravel, MD is PCP  ?Croitoru, Dani Gobble, MD is cardiologist ?Brent Pike, MD is hematologist. Evaluation on 02/20/20 for easily bruising, felt likely due to prednisone use.  CBC with smear,  CMET, PFA/platelet function, PT/PTT normal.  ? ? ?LABS: Labs reviewed: Acceptable for surgery. ?(all labs ordered are listed, but only abnormal results are displayed) ? ?Labs Reviewed  ?SURGICAL PCR SCREEN  ?SARS CORONAVIRUS 2 BY RT PCR (HOSPITAL ORDER, [facility])  ?CBC  ?COMPREHENSIVE METABOLIC PANEL  ? ? ?EKG: 03/31/21: ?Normal sinus rhythm ?Left axis deviation ?Septal infarct , age undetermined ?Abnormal ECG ? ? ?CV: ?Echo 05/11/16: ?Study Conclusions  ?- Left ventricle: The cavity size was normal. Wall thickness was  ?  increased in a pattern of moderate LVH. Systolic function was  ?  normal. The estimated ejection fraction was in the range of 60%  ?  to 65%. Wall motion was normal; there were no regional wall  ?  motion abnormalities. Doppler parameters are consistent with  ?  abnormal left ventricular relaxation (grade 1 diastolic  ?  dysfunction). The E/e&' ratio is between 8-15, suggesting  ?  indeterminate LV filling pressure.  ?- Aorta: Dilated aortic root and ascending aorta. Aortic root  ?  dimension: 41 mm (ED). Ascending aortic diameter: 40 mm (S).  ?- Mitral valve: Mildly thickened leaflets . There was trivial  ?  regurgitation.  ?- Left atrium: The atrium was normal in size.  ?- Inferior vena cava: The vessel was normal in size. The  ?  respirophasic diameter changes were in the normal range (>= 50%),  ?  consistent with normal central venous pressure.  ?Impressions:  ?- LVEF 60-65, moderate LVH,  normal wall motion, grade 1 DD with  ?  indeterminate LV filling pressure, dilated aortic root and  ?  ascending aorta, trivial MR, normal LA size, normal IVC.  ? ? ?Lexiscan Stress test 05/06/13: ?Overall Impression:   ?- Low risk stress nuclear study with fixed inferolateral bowel artifact. Marked hypertensive response to exercise - study converted to [place]. ?- LV Wall Motion:  NL LV Function; NL Wall Motion; EF 55%. ? ? ?Cardiac event monitor 04/30/13-05/30/13: Per 06/12/13 notation  by Brent Copa, PA-C "This report shows SR/ST/nonrecurring SB (45) with PACs." ? ?Past Medical History:  ?Diagnosis Date  ? Chronic back pain   ? Diverticulosis   ? a. s/p partial colectomy 2005 for anastamotic leak with reversal.  ? History of kidney stones   ? Hyperlipidemia   ? Hypertension   ? Normal cardiac stress test 2015  ? low risk  ? Obesity   ? Sciatica, left side   ? Tobacco abuse   ? ? ?Past Surgical History:  ?Procedure Laterality Date  ? BACK SURGERY  2021  ? "two-level instrumented fusion without interbody cages" in [place]  ? COLOSTOMY REVERSAL    ? KNEE ARTHROSCOPY Right   ? KNEE ARTHROSCOPY Right 01/20/2013  ? Procedure: ARTHROSCOPY KNEE;  Surgeon: Brent Kava, MD;  Location: [facility];  Service: Orthopedics;  Laterality: Right;  ? MENISECTOMY Right 01/20/2013  ? Procedure: partial medial menisectomy;  Surgeon: Brent Kava, MD;  Location: [facility];  Service: Orthopedics;  Laterality: Right;  ? PARTIAL COLECTOMY    ? 2 subsequent surgeries to reverse his colostomy-diverticulosis  ? WISDOM TOOTH EXTRACTION    ? ? ?MEDICATIONS: ? amLODipine (NORVASC) 5 MG tablet  ? carvedilol (COREG) 25 MG tablet  ? ? bupivacaine liposome (EXPAREL) 1.3 % injection 266 mg  ? ? ?Brent Gianotti, PA-C ?Surgical Short Stay/Anesthesiology ?[facility] Phone [phone] ?[facility] Phone [phone] ?03/31/2021 6:07 PM ? ? ? ? ? ? ?

## 2021-04-04 ENCOUNTER — Encounter (HOSPITAL_COMMUNITY): Payer: Self-pay | Admitting: Orthopaedic Surgery

## 2021-04-04 ENCOUNTER — Encounter (HOSPITAL_COMMUNITY)
Admission: RE | Disposition: A | Discharge status: Home Health | Payer: Self-pay | Source: Home / Self Care | Attending: Orthopaedic Surgery

## 2021-04-04 ENCOUNTER — Ambulatory Visit (HOSPITAL_COMMUNITY): Payer: 59 | Admitting: Vascular Surgery

## 2021-04-04 ENCOUNTER — Other Ambulatory Visit: Payer: Self-pay

## 2021-04-04 ENCOUNTER — Ambulatory Visit (HOSPITAL_BASED_OUTPATIENT_CLINIC_OR_DEPARTMENT_OTHER): Payer: 59 | Admitting: Anesthesiology

## 2021-04-04 ENCOUNTER — Observation Stay (HOSPITAL_COMMUNITY): Payer: 59

## 2021-04-04 ENCOUNTER — Observation Stay (HOSPITAL_COMMUNITY)
Admission: RE | Admit: 2021-04-04 | Discharge: 2021-04-06 | Disposition: A | Discharge status: Home Health | Payer: 59 | Attending: Orthopaedic Surgery | Admitting: Orthopaedic Surgery

## 2021-04-04 DIAGNOSIS — F17210 Nicotine dependence, cigarettes, uncomplicated: Secondary | ICD-10-CM | POA: Diagnosis not present

## 2021-04-04 DIAGNOSIS — M1712 Unilateral primary osteoarthritis, left knee: Principal | ICD-10-CM | POA: Diagnosis present

## 2021-04-04 DIAGNOSIS — I10 Essential (primary) hypertension: Secondary | ICD-10-CM | POA: Insufficient documentation

## 2021-04-04 DIAGNOSIS — Z09 Encounter for follow-up examination after completed treatment for conditions other than malignant neoplasm: Secondary | ICD-10-CM

## 2021-04-04 DIAGNOSIS — Z79899 Other long term (current) drug therapy: Secondary | ICD-10-CM | POA: Insufficient documentation

## 2021-04-04 DIAGNOSIS — Z01818 Encounter for other preprocedural examination: Secondary | ICD-10-CM

## 2021-04-04 HISTORY — PX: TOTAL KNEE ARTHROPLASTY: SHX125

## 2021-04-04 SURGERY — ARTHROPLASTY, KNEE, TOTAL
Anesthesia: Spinal | Site: Knee | Laterality: Left

## 2021-04-04 MED ORDER — ACETAMINOPHEN 10 MG/ML IV SOLN
1000.0000 mg | Freq: Once | INTRAVENOUS | Status: DC | PRN
  Administered 2021-04-04: 1000 mg via INTRAVENOUS

## 2021-04-04 MED ORDER — CHLORHEXIDINE GLUCONATE 0.12 % MT SOLN
15.0000 mL | Freq: Once | OROMUCOSAL | Status: AC
  Administered 2021-04-04: 15 mL via OROMUCOSAL
  Filled 2021-04-04: qty 15

## 2021-04-04 MED ORDER — METHOCARBAMOL 500 MG PO TABS
500.0000 mg | ORAL_TABLET | Freq: Four times a day (QID) | ORAL | Status: DC | PRN
Start: 2021-04-04 — End: 2021-04-06
  Administered 2021-04-05 – 2021-04-06 (×3): 500 mg via ORAL
  Filled 2021-04-04 (×3): qty 1

## 2021-04-04 MED ORDER — ONDANSETRON HCL 4 MG/2ML IJ SOLN
INTRAMUSCULAR | Status: AC
  Filled 2021-04-04: qty 2

## 2021-04-04 MED ORDER — MIDAZOLAM HCL 2 MG/2ML IJ SOLN
INTRAMUSCULAR | Status: AC
  Administered 2021-04-04: 2 mg via INTRAVENOUS
  Filled 2021-04-04: qty 2

## 2021-04-04 MED ORDER — HYDROMORPHONE HCL 1 MG/ML IJ SOLN
0.2500 mg | INTRAMUSCULAR | Status: DC | PRN
  Administered 2021-04-04 (×2): 0.5 mg via INTRAVENOUS

## 2021-04-04 MED ORDER — LACTATED RINGERS IV SOLN: INTRAVENOUS | Status: DC

## 2021-04-04 MED ORDER — ORAL CARE MOUTH RINSE: 15.0000 mL | Freq: Once | OROMUCOSAL | Status: AC

## 2021-04-04 MED ORDER — METOCLOPRAMIDE HCL 5 MG/ML IJ SOLN: 5.0000 mg | Freq: Three times a day (TID) | INTRAMUSCULAR | Status: DC | PRN

## 2021-04-04 MED ORDER — PHENOL 1.4 % MT LIQD: 1.0000 | OROMUCOSAL | Status: DC | PRN

## 2021-04-04 MED ORDER — BUPIVACAINE HCL (PF) 0.25 % IJ SOLN
INTRAMUSCULAR | Status: DC | PRN
  Administered 2021-04-04: 20 mL

## 2021-04-04 MED ORDER — PROPOFOL 10 MG/ML IV BOLUS
INTRAVENOUS | Status: AC
  Filled 2021-04-04: qty 20

## 2021-04-04 MED ORDER — DOCUSATE SODIUM 100 MG PO CAPS
100.0000 mg | ORAL_CAPSULE | Freq: Two times a day (BID) | ORAL | Status: DC
  Administered 2021-04-04 – 2021-04-06 (×4): 100 mg via ORAL
  Filled 2021-04-04 (×4): qty 1

## 2021-04-04 MED ORDER — 0.9 % SODIUM CHLORIDE (POUR BTL) OPTIME
TOPICAL | Status: DC | PRN
  Administered 2021-04-04: 1000 mL

## 2021-04-04 MED ORDER — BUPIVACAINE HCL (PF) 0.25 % IJ SOLN
INTRAMUSCULAR | Status: AC
  Filled 2021-04-04: qty 30

## 2021-04-04 MED ORDER — CARVEDILOL 25 MG PO TABS
25.0000 mg | ORAL_TABLET | Freq: Two times a day (BID) | ORAL | Status: DC
  Administered 2021-04-05 – 2021-04-06 (×4): 25 mg via ORAL
  Filled 2021-04-04 (×4): qty 1

## 2021-04-04 MED ORDER — ONDANSETRON HCL 4 MG/2ML IJ SOLN
4.0000 mg | Freq: Four times a day (QID) | INTRAMUSCULAR | Status: DC | PRN
Start: 2021-04-04 — End: 2021-04-06

## 2021-04-04 MED ORDER — SODIUM CHLORIDE 0.9 % IV SOLN: INTRAVENOUS | Status: DC

## 2021-04-04 MED ORDER — ONDANSETRON HCL 4 MG PO TABS
4.0000 mg | ORAL_TABLET | Freq: Four times a day (QID) | ORAL | Status: DC | PRN
Start: 2021-04-04 — End: 2021-04-06

## 2021-04-04 MED ORDER — MENTHOL 3 MG MT LOZG: 1.0000 | LOZENGE | OROMUCOSAL | Status: DC | PRN

## 2021-04-04 MED ORDER — ONDANSETRON HCL 4 MG/2ML IJ SOLN: 4.0000 mg | Freq: Once | INTRAMUSCULAR | Status: DC | PRN

## 2021-04-04 MED ORDER — MIDAZOLAM HCL 2 MG/2ML IJ SOLN: 2.0000 mg | Freq: Once | INTRAMUSCULAR | Status: AC

## 2021-04-04 MED ORDER — LIDOCAINE 2% (20 MG/ML) 5 ML SYRINGE
INTRAMUSCULAR | Status: DC | PRN
  Administered 2021-04-04: 25 mg via INTRAVENOUS

## 2021-04-04 MED ORDER — ACETAMINOPHEN 10 MG/ML IV SOLN
INTRAVENOUS | Status: AC
  Filled 2021-04-04: qty 100

## 2021-04-04 MED ORDER — METOCLOPRAMIDE HCL 5 MG PO TABS: 5.0000 mg | ORAL_TABLET | Freq: Three times a day (TID) | ORAL | Status: DC | PRN

## 2021-04-04 MED ORDER — FENTANYL CITRATE (PF) 100 MCG/2ML IJ SOLN
INTRAMUSCULAR | Status: AC
  Administered 2021-04-04: 100 ug via INTRAVENOUS
  Filled 2021-04-04: qty 2

## 2021-04-04 MED ORDER — AMLODIPINE BESYLATE 10 MG PO TABS
10.0000 mg | ORAL_TABLET | Freq: Every day | ORAL | Status: DC
  Administered 2021-04-04 – 2021-04-06 (×3): 10 mg via ORAL
  Filled 2021-04-04 (×3): qty 1

## 2021-04-04 MED ORDER — ASPIRIN EC 325 MG PO TBEC
325.0000 mg | DELAYED_RELEASE_TABLET | Freq: Every day | ORAL | Status: DC
  Administered 2021-04-05 – 2021-04-06 (×2): 325 mg via ORAL
  Filled 2021-04-04 (×2): qty 1

## 2021-04-04 MED ORDER — PHENYLEPHRINE 40 MCG/ML (10ML) SYRINGE FOR IV PUSH (FOR BLOOD PRESSURE SUPPORT)
PREFILLED_SYRINGE | INTRAVENOUS | Status: DC | PRN
  Administered 2021-04-04 (×6): 40 ug via INTRAVENOUS
  Administered 2021-04-04: 80 ug via INTRAVENOUS
  Administered 2021-04-04 (×2): 40 ug via INTRAVENOUS

## 2021-04-04 MED ORDER — ACETAMINOPHEN 325 MG PO TABS
325.0000 mg | ORAL_TABLET | Freq: Four times a day (QID) | ORAL | Status: DC | PRN
  Administered 2021-04-05 – 2021-04-06 (×2): 650 mg via ORAL
  Filled 2021-04-04 (×2): qty 2

## 2021-04-04 MED ORDER — FENTANYL CITRATE (PF) 100 MCG/2ML IJ SOLN: 100.0000 ug | Freq: Once | INTRAMUSCULAR | Status: AC

## 2021-04-04 MED ORDER — OXYCODONE HCL 5 MG PO TABS
10.0000 mg | ORAL_TABLET | ORAL | Status: DC | PRN
  Administered 2021-04-04 – 2021-04-06 (×6): 15 mg via ORAL
  Filled 2021-04-04 (×6): qty 3

## 2021-04-04 MED ORDER — HYDROMORPHONE HCL 1 MG/ML IJ SOLN
INTRAMUSCULAR | Status: AC
  Filled 2021-04-04: qty 1

## 2021-04-04 MED ORDER — POLYETHYLENE GLYCOL 3350 17 G PO PACK: 17.0000 g | PACK | Freq: Every day | ORAL | Status: DC | PRN

## 2021-04-04 MED ORDER — PROPOFOL 10 MG/ML IV BOLUS
INTRAVENOUS | Status: DC | PRN
Start: 2021-04-04 — End: 2021-04-04
  Administered 2021-04-04: 50 mg via INTRAVENOUS

## 2021-04-04 MED ORDER — MIDAZOLAM HCL 2 MG/2ML IJ SOLN
INTRAMUSCULAR | Status: AC
  Filled 2021-04-04: qty 2

## 2021-04-04 MED ORDER — BUPIVACAINE IN DEXTROSE 0.75-8.25 % IT SOLN
INTRATHECAL | Status: DC | PRN
  Administered 2021-04-04: 1.8 mL via INTRATHECAL

## 2021-04-04 MED ORDER — TRANEXAMIC ACID-NACL 1000-0.7 MG/100ML-% IV SOLN
1000.0000 mg | INTRAVENOUS | Status: AC
  Administered 2021-04-04: 1000 mg via INTRAVENOUS
  Filled 2021-04-04: qty 100

## 2021-04-04 MED ORDER — BUPIVACAINE LIPOSOME 1.3 % IJ SUSP
INTRAMUSCULAR | Status: DC | PRN
Start: 2021-04-04 — End: 2021-04-04
  Administered 2021-04-04: 20 mL

## 2021-04-04 MED ORDER — PROPOFOL 500 MG/50ML IV EMUL
INTRAVENOUS | Status: DC | PRN
  Administered 2021-04-04: 50 ug/kg/min via INTRAVENOUS

## 2021-04-04 MED ORDER — OXYCODONE HCL 5 MG PO TABS: 5.0000 mg | ORAL_TABLET | ORAL | Status: DC | PRN

## 2021-04-04 MED ORDER — OXYCODONE HCL 5 MG/5ML PO SOLN: 5.0000 mg | Freq: Once | ORAL | Status: DC | PRN

## 2021-04-04 MED ORDER — SODIUM CHLORIDE 0.9 % IR SOLN
Status: DC | PRN
  Administered 2021-04-04: 3000 mL

## 2021-04-04 MED ORDER — ONDANSETRON HCL 4 MG/2ML IJ SOLN
INTRAMUSCULAR | Status: DC | PRN
  Administered 2021-04-04: 4 mg via INTRAVENOUS

## 2021-04-04 MED ORDER — LIDOCAINE 2% (20 MG/ML) 5 ML SYRINGE
INTRAMUSCULAR | Status: AC
  Filled 2021-04-04: qty 5

## 2021-04-04 MED ORDER — FENTANYL CITRATE (PF) 250 MCG/5ML IJ SOLN
INTRAMUSCULAR | Status: AC
  Filled 2021-04-04: qty 5

## 2021-04-04 MED ORDER — OXYCODONE HCL 5 MG PO TABS: 5.0000 mg | ORAL_TABLET | Freq: Once | ORAL | Status: DC | PRN

## 2021-04-04 MED ORDER — BUPIVACAINE LIPOSOME 1.3 % IJ SUSP
INTRAMUSCULAR | Status: AC
  Filled 2021-04-04: qty 20

## 2021-04-04 MED ORDER — HYDROMORPHONE HCL 1 MG/ML IJ SOLN
0.5000 mg | INTRAMUSCULAR | Status: DC | PRN
  Administered 2021-04-05 – 2021-04-06 (×2): 0.5 mg via INTRAVENOUS
  Filled 2021-04-04 (×2): qty 0.5

## 2021-04-04 MED ORDER — VANCOMYCIN HCL IN DEXTROSE 1-5 GM/200ML-% IV SOLN
1000.0000 mg | INTRAVENOUS | Status: AC
  Administered 2021-04-04: 1000 mg via INTRAVENOUS
  Filled 2021-04-04: qty 200

## 2021-04-04 MED ORDER — DEXAMETHASONE SODIUM PHOSPHATE 10 MG/ML IJ SOLN
INTRAMUSCULAR | Status: DC | PRN
  Administered 2021-04-04: 5 mg via INTRAVENOUS

## 2021-04-04 MED ORDER — DEXAMETHASONE SODIUM PHOSPHATE 10 MG/ML IJ SOLN
INTRAMUSCULAR | Status: AC
  Filled 2021-04-04: qty 1

## 2021-04-04 MED ORDER — METHOCARBAMOL 1000 MG/10ML IJ SOLN
500.0000 mg | Freq: Four times a day (QID) | INTRAVENOUS | Status: DC | PRN
  Filled 2021-04-04: qty 5

## 2021-04-04 MED ORDER — ROPIVACAINE HCL 5 MG/ML IJ SOLN
INTRAMUSCULAR | Status: DC | PRN
  Administered 2021-04-04: 30 mL via PERINEURAL

## 2021-04-04 SURGICAL SUPPLY — 80 items
ATTUNE PS FEM LT SZ 6 CEM KNEE (Femur) ×2 IMPLANT
ATTUNE PSRP INSR SZ6 5 KNEE (Insert) ×1 IMPLANT
ATTUNE PSRP INSR SZ6 5MM KNEE (Insert) ×1 IMPLANT
BAG COUNTER SPONGE SURGICOUNT (BAG) ×2 IMPLANT
BAG SURGICOUNT SPONGE COUNTING (BAG) ×1
BANDAGE ESMARK 6X9 LF (GAUZE/BANDAGES/DRESSINGS) ×1 IMPLANT
BASE TIBIA ATTUNE KNEE SYS SZ6 (Knees) IMPLANT
BENZOIN TINCTURE PRP APPL 2/3 (GAUZE/BANDAGES/DRESSINGS) ×1 IMPLANT
BLADE SAGITTAL 25.0X1.19X90 (BLADE) ×2 IMPLANT
BLADE SAGITTAL 25.0X1.19X90MM (BLADE) ×1
BLADE SAW SGTL 13X75X1.27 (BLADE) ×3 IMPLANT
BNDG ELASTIC 4X5.8 VLCR STR LF (GAUZE/BANDAGES/DRESSINGS) ×3 IMPLANT
BNDG ELASTIC 6X5.8 VLCR STR LF (GAUZE/BANDAGES/DRESSINGS) ×2 IMPLANT
BNDG ESMARK 6X9 LF (GAUZE/BANDAGES/DRESSINGS) ×3
BOWL SMART MIX CTS (DISPOSABLE) ×3 IMPLANT
CEMENT HV SMART SET (Cement) ×6 IMPLANT
COVER SURGICAL LIGHT HANDLE (MISCELLANEOUS) ×3 IMPLANT
CUFF TOURN SGL QUICK 34 (TOURNIQUET CUFF) ×3
CUFF TOURN SGL QUICK 42 (TOURNIQUET CUFF) IMPLANT
CUFF TRNQT CYL 34X4.125X (TOURNIQUET CUFF) ×1 IMPLANT
DRAPE ORTHO SPLIT 77X108 STRL (DRAPES) ×6
DRAPE SURG ORHT 6 SPLT 77X108 (DRAPES) ×2 IMPLANT
DRAPE U-SHAPE 47X51 STRL (DRAPES) ×3 IMPLANT
DRSG MEPILEX BORDER 4X12 (GAUZE/BANDAGES/DRESSINGS) ×2 IMPLANT
DRSG PAD ABDOMINAL 8X10 ST (GAUZE/BANDAGES/DRESSINGS) ×3 IMPLANT
DURAPREP 26ML APPLICATOR (WOUND CARE) ×6 IMPLANT
ELECT REM PT RETURN 9FT ADLT (ELECTROSURGICAL) ×3
ELECTRODE REM PT RTRN 9FT ADLT (ELECTROSURGICAL) ×1 IMPLANT
EVACUATOR 1/8 PVC DRAIN (DRAIN) IMPLANT
FACESHIELD WRAPAROUND (MASK) ×6 IMPLANT
FACESHIELD WRAPAROUND OR TEAM (MASK) ×2 IMPLANT
GAUZE SPONGE 4X4 12PLY STRL (GAUZE/BANDAGES/DRESSINGS) ×3 IMPLANT
GAUZE XEROFORM 1X8 LF (GAUZE/BANDAGES/DRESSINGS) IMPLANT
GAUZE XEROFORM 5X9 LF (GAUZE/BANDAGES/DRESSINGS) ×1 IMPLANT
GLOVE SRG 8 PF TXTR STRL LF DI (GLOVE) ×2 IMPLANT
GLOVE SURG ORTHO LTX SZ7.5 (GLOVE) ×6 IMPLANT
GLOVE SURG UNDER POLY LF SZ8 (GLOVE) ×6
GOWN STRL REUS W/ TWL LRG LVL3 (GOWN DISPOSABLE) ×1 IMPLANT
GOWN STRL REUS W/ TWL XL LVL3 (GOWN DISPOSABLE) ×1 IMPLANT
GOWN STRL REUS W/TWL 2XL LVL3 (GOWN DISPOSABLE) ×3 IMPLANT
GOWN STRL REUS W/TWL LRG LVL3 (GOWN DISPOSABLE) ×3
GOWN STRL REUS W/TWL XL LVL3 (GOWN DISPOSABLE) ×3
HANDPIECE INTERPULSE COAX TIP (DISPOSABLE) ×3
IMMOBILIZER KNEE 22 UNIV (SOFTGOODS) ×1 IMPLANT
KIT BASIN OR (CUSTOM PROCEDURE TRAY) ×3 IMPLANT
KIT TURNOVER KIT B (KITS) ×3 IMPLANT
MANIFOLD NEPTUNE II (INSTRUMENTS) ×3 IMPLANT
MARKER SKIN DUAL TIP RULER LAB (MISCELLANEOUS) ×3 IMPLANT
NDL 18GX1X1/2 (RX/OR ONLY) (NEEDLE) ×1 IMPLANT
NDL HYPO 25GX1X1/2 BEV (NEEDLE) ×1 IMPLANT
NEEDLE 18GX1X1/2 (RX/OR ONLY) (NEEDLE) ×3 IMPLANT
NEEDLE HYPO 25GX1X1/2 BEV (NEEDLE) ×3 IMPLANT
NS IRRIG 1000ML POUR BTL (IV SOLUTION) ×3 IMPLANT
PACK TOTAL JOINT (CUSTOM PROCEDURE TRAY) ×3 IMPLANT
PAD ARMBOARD 7.5X6 YLW CONV (MISCELLANEOUS) ×6 IMPLANT
PAD CAST 4YDX4 CTTN HI CHSV (CAST SUPPLIES) ×1 IMPLANT
PADDING CAST COTTON 4X4 STRL (CAST SUPPLIES) ×3
PADDING CAST COTTON 6X4 STRL (CAST SUPPLIES) ×3 IMPLANT
PATELLA MEDIAL ATTUN 35MM KNEE (Knees) ×2 IMPLANT
SET HNDPC FAN SPRY TIP SCT (DISPOSABLE) ×1 IMPLANT
SMARTMIX MINI TOWER (MISCELLANEOUS)
SPONGE T-LAP 18X18 ~~LOC~~+RFID (SPONGE) ×4 IMPLANT
STAPLER VISISTAT 35W (STAPLE) IMPLANT
SUCTION FRAZIER HANDLE 10FR (MISCELLANEOUS) ×3
SUCTION TUBE FRAZIER 10FR DISP (MISCELLANEOUS) ×1 IMPLANT
SUT VIC AB 0 CT1 27 (SUTURE) ×3
SUT VIC AB 0 CT1 27XBRD ANBCTR (SUTURE) ×1 IMPLANT
SUT VIC AB 1 CTX 27 (SUTURE) ×4 IMPLANT
SUT VIC AB 1 CTX 36 (SUTURE) ×6
SUT VIC AB 1 CTX36XBRD ANBCTR (SUTURE) ×2 IMPLANT
SUT VIC AB 2-0 CT1 27 (SUTURE) ×9
SUT VIC AB 2-0 CT1 TAPERPNT 27 (SUTURE) ×2 IMPLANT
SUT VIC AB 3-0 X1 27 (SUTURE) ×3 IMPLANT
SYR 50ML LL SCALE MARK (SYRINGE) ×3 IMPLANT
SYR CONTROL 10ML LL (SYRINGE) ×3 IMPLANT
TIBIA ATTUNE KNEE SYS BASE SZ6 (Knees) ×3 IMPLANT
TOWEL GREEN STERILE (TOWEL DISPOSABLE) ×3 IMPLANT
TOWEL GREEN STERILE FF (TOWEL DISPOSABLE) ×3 IMPLANT
TOWER SMARTMIX MINI (MISCELLANEOUS) IMPLANT
TRAY CATH 16FR W/PLASTIC CATH (SET/KITS/TRAYS/PACK) IMPLANT

## 2021-04-04 NOTE — Evaluation (Signed)
Physical Therapy Evaluation ?Patient Details ?Name: Brent Nguyen ?MRN: [id number] ?DOB: Sep 20, 1965 ?Today's Date: 04/04/2021 ? ?History of Present Illness ? Pt is a 56 y/o male s/p L TKA. PMH includes HTN, tobacco use and back surgery.  ?Clinical Impression ? Pt is s/p surgery above with deficits below. Pt with increased effects from nerve block, so mobility limited to EOB. Requiring min A for bed mobility tasks. Was also able to perform HEP. Anticipate pt will progress well as symptoms improve. Will continue to follow acutely.    ?   ? ?Recommendations for follow up therapy are one component of a multi-disciplinary discharge planning process, led by the attending physician.  Recommendations may be updated based on patient status, additional functional criteria and insurance authorization. ? ?Follow Up Recommendations Follow physician's recommendations for discharge plan and follow up therapies ? ?  ?Assistance Recommended at Discharge Intermittent Supervision/Assistance  ?Patient can return home with the following ? Help with stairs or ramp for entrance;Assistance with cooking/housework;Assist for transportation ? ?  ?Equipment Recommendations None recommended by PT  ?Recommendations for Other Services ?    ?  ?Functional Status Assessment Patient has had a recent decline in their functional status and demonstrates the ability to make significant improvements in function in a reasonable and predictable amount of time.  ? ?  ?Precautions / Restrictions Precautions ?Precautions: Knee ?Precaution Booklet Issued: No ?Precaution Comments: Verbally reviewed knee precautions ?Required Braces or Orthoses: Knee Immobilizer - Left ?Knee Immobilizer - Left: Other (comment) (except when performing exercises or in CPM per MD request) ?Restrictions ?Weight Bearing Restrictions: Yes ?LLE Weight Bearing: Weight bearing as tolerated ?Other Position/Activity Restrictions: KI on with mobility tasks per MD request  ? ?  ? ?Mobility ?  Bed Mobility ?Overal bed mobility: Needs Assistance ?Bed Mobility: Supine to Sit, Sit to Supine ?  ?  ?Supine to sit: Min assist ?Sit to supine: Supervision ?  ?General bed mobility comments: Min A for LLE assist. Increased time required. Pt with effects from nerve block in BLE, so further mobility deferred for safety. ?  ? ?Transfers ?  ?  ?  ?  ?  ?  ?  ?  ?  ?  ?  ? ?Ambulation/Gait ?  ?  ?  ?  ?  ?  ?  ?  ? ?Stairs ?  ?  ?  ?  ?  ? ?Wheelchair Mobility ?  ? ?Modified Rankin (Stroke Patients Only) ?  ? ?  ? ?Balance Overall balance assessment: Needs assistance ?Sitting-balance support: No upper extremity supported ?Sitting balance-Leahy Scale: Good ?  ?  ?  ?  ?  ?  ?  ?  ?  ?  ?  ?  ?  ?  ?  ?  ?   ? ? ? ?Pertinent Vitals/Pain Pain Assessment ?Pain Assessment: Faces ?Faces Pain Scale: Hurts a little bit ?Pain Location: L knee ?Pain Descriptors / Indicators: Grimacing, Guarding ?Pain Intervention(s): Limited activity within patient's tolerance, Monitored during session, Repositioned  ? ? ?Home Living Family/patient expects to be discharged to:: Private residence ?Living Arrangements: Spouse/significant other ?Available Help at Discharge: Family ?Type of Home: House ?Home Access: Stairs to enter ?Entrance Stairs-Rails: Right;Left;Can reach both ?Entrance Stairs-Number of Steps: 2 ?  ?Home Layout: One level ?Home Equipment: Agricultural consultant (2 wheels);Crutches;BSC/3in1 ?   ?  ?Prior Function Prior Level of Function : Independent/Modified Independent ?  ?  ?  ?  ?  ?  ?Mobility Comments: Has had  falls secondary to L knee buckling ?  ?  ? ? ?Hand Dominance  ?   ? ?  ?Extremity/Trunk Assessment  ? Upper Extremity Assessment ?Upper Extremity Assessment: Overall WFL for tasks assessed ?  ? ?Lower Extremity Assessment ?Lower Extremity Assessment: LLE deficits/detail;RLE deficits/detail ?RLE Deficits / Details: Reporting numbness and tingling throughout ?LLE Deficits / Details: Reports numbness in LLE. Deficits consistent  with post op pain and weakness. ?  ? ?Cervical / Trunk Assessment ?Cervical / Trunk Assessment: Normal  ?Communication  ? Communication: No difficulties  ?Cognition Arousal/Alertness: Awake/alert ?Behavior During Therapy: [facility] for tasks assessed/performed ?Overall Cognitive Status: Within Functional Limits for tasks assessed ?  ?  ?  ?  ?  ?  ?  ?  ?  ?  ?  ?  ?  ?  ?  ?  ?  ?  ?  ? ?  ?General Comments   ? ?  ?Exercises Total Joint Exercises ?Ankle Circles/Pumps: AROM, Both, 20 reps ?Quad Sets: AROM, Left, 10 reps ?Heel Slides: AROM, Left, 10 reps  ? ?Assessment/Plan  ?  ?PT Assessment Patient needs continued PT services  ?PT Problem List Decreased strength;Decreased activity tolerance;Decreased balance;Decreased mobility;Decreased knowledge of use of DME;Decreased knowledge of precautions;Impaired sensation ? ?   ?  ?PT Treatment Interventions DME instruction;Gait training;Stair training;Functional mobility training;Therapeutic activities;Therapeutic exercise;Balance training;Patient/family education   ? ?PT Goals (Current goals can be found in the Care Plan section)  ?Acute Rehab PT Goals ?Patient Stated Goal: to go home ?PT Goal Formulation: With patient ?Time For Goal Achievement: 04/18/21 ?Potential to Achieve Goals: Good ? ?  ?Frequency 7X/week ?  ? ? ?Co-evaluation   ?  ?  ?  ?  ? ? ?  ?AM-PAC PT "6 Clicks" Mobility  ?Outcome Measure Help needed turning from your back to your side while in a flat bed without using bedrails?: None ?Help needed moving from lying on your back to sitting on the side of a flat bed without using bedrails?: A Little ?Help needed moving to and from a bed to a chair (including a wheelchair)?: A Lot ?Help needed standing up from a chair using your arms (e.g., wheelchair or bedside chair)?: A Lot ?Help needed to walk in hospital room?: A Lot ?Help needed climbing 3-5 steps with a railing? : A Lot ?6 Click Score: 15 ? ?  ?End of Session Equipment Utilized During Treatment: Left knee  immobilizer ?Activity Tolerance: Treatment limited secondary to medical complications (Comment) (nerve block effects) ?Patient left: in bed;with nursing/sitter in room (on bed in PACU) ?Nurse Communication: Mobility status ?PT Visit Diagnosis: Unsteadiness on feet (R26.81);Muscle weakness (generalized) (M62.81);Difficulty in walking, not elsewhere classified (R26.2) ?  ? ?Time: 2774-1287 ?PT Time Calculation (min) (ACUTE ONLY): 18 min ? ? ?Charges:   PT Evaluation ?$PT Eval Low Complexity: 1 Low ?  ?  ?   ? ? ?Farley Ly, PT, DPT  ?Acute Rehabilitation Services  ?Pager: [phone] ?Office: [phone] ? ? ?[place] S Jatia Musa ?04/04/2021, 1:50 PM ?

## 2021-04-04 NOTE — Interval H&P Note (Signed)
History and Physical Interval Note: ? ?04/04/2021 ?9:20 AM ? ?Brent Nguyen  has presented today for surgery, with the diagnosis of Left knee Osteoarthritis.  The various methods of treatment have been discussed with the patient and family. After consideration of risks, benefits and other options for treatment, the patient has consented to  Procedure(s): ?Left TOTAL KNEE ARTHROPLASTY (Left) as a surgical intervention.  The patient's history has been reviewed, patient examined, no change in status, stable for surgery.  I have reviewed the patient's chart and labs.  Questions were answered to the patient's satisfaction.   ? ? ?[name] ? ? ?

## 2021-04-04 NOTE — Anesthesia Procedure Notes (Addendum)
Anesthesia Regional Block: Adductor canal block  ? ?Pre-Anesthetic Checklist: , timeout performed,  Correct Patient, Correct Site, Correct Laterality,  Correct Procedure, Correct Position, site marked,  Risks and benefits discussed,  Surgical consent,  Pre-op evaluation,  At surgeon's request and post-op pain management ? ?Laterality: Left ? ?Prep: chloraprep     ?  ?Needles:  ?Injection technique: Single-shot ? ?Needle Type: Echogenic Needle   ? ? ?Needle Length: 9cm  ? ? ? ? ?Additional Needles: ? ? ?Procedures:,,,, ultrasound used (permanent image in chart),,    ?Narrative:  ?Start time: 04/04/2021 9:27 AM ?End time: 04/04/2021 9:33 AM ?Injection made incrementally with aspirations every 5 mL. ? ?Performed by: Personally  ?Anesthesiologist: Myrtie Soman, MD ? ?Additional Notes: ?Patient tolerated the procedure well without complications ? ? ? ?

## 2021-04-04 NOTE — Anesthesia Postprocedure Evaluation (Signed)
Anesthesia Post Note ? ?Patient: Brent Nguyen ? ?Procedure(s) Performed: Left TOTAL KNEE ARTHROPLASTY (Left: Knee) ? ?  ? ?Patient location during evaluation: PACU ?Anesthesia Type: Spinal ?Level of consciousness: oriented and awake and alert ?Pain management: pain level controlled ?Vital Signs Assessment: post-procedure vital signs reviewed and stable ?Respiratory status: spontaneous breathing, respiratory function stable and patient connected to nasal cannula oxygen ?Cardiovascular status: blood pressure returned to baseline and stable ?Postop Assessment: no headache, no backache and no apparent nausea or vomiting ?Anesthetic complications: no ? ? ?No notable events documented. ? ?Last Vitals:  ?Vitals:  ? 04/04/21 0925 04/04/21 1208  ?BP: (!) 136/106 104/70  ?Pulse: 65 65  ?Resp: 19 17  ?Temp:  36.4 ?C  ?SpO2: 99% 98%  ?  ?Last Pain:  ?Vitals:  ? 04/04/21 1208  ?TempSrc:   ?PainSc: 0-No pain  ? ? ?  ?  ?  ?  ?  ?  ? ?Wayne Brunker S ? ? ? ? ?

## 2021-04-04 NOTE — H&P (Signed)
TOTAL KNEE ADMISSION H&P ? ?Patient is being admitted for left total knee arthroplasty. ? ?Subjective: ? ?Chief Complaint:left knee pain. ? ? ?56 year old white male with history of end-stage DJD left knee and pain comes in for preop evaluation.  States that knee symptoms unchanged from previous visit.  He is wanting to proceed with left total knee replacement as scheduled.  Today history and physical performed.  Review of systems negative.  Blood pressure 150/108.  Patient sees cardiologist Dr. Royann Shivers and after reviewing chart looks like he was last seen in that office September 2021.  Patient states that he was scheduled to see him August 2022 but did not keep that appointment.  Documented history of palpitations and hypertension. ?HPI: Brent Nguyen, 56 y.o. male, has a history of pain and functional disability in the left knee due to arthritis and has failed non-surgical conservative treatments for greater than 12 weeks to includeNSAID's and/or analgesics, corticosteriod injections, use of assistive devices, weight reduction as appropriate, and activity modification.  Onset of symptoms was gradual, starting 10 years ago with gradually worsening course since that time.   Patient currently rates pain in the left knee(s) at 10 out of 10 with activity. Patient has night pain, worsening of pain with activity and weight bearing, pain that interferes with activities of daily living, pain with passive range of motion, crepitus, and joint swelling.  Patient has evidence of subchondral sclerosis, periarticular osteophytes, and joint space narrowing by imaging studies. . There is no active infection. ? ?Patient Active Problem List  ? Diagnosis Date Noted  ? Unilateral primary osteoarthritis, left knee 12/17/2020  ? Chest pain 05/17/2013  ? Palpitations 05/17/2013  ? Hyperlipidemia 04/25/2013  ? Diverticulosis s/p partial colectomy with reversal   ? HTN (hypertension), severe 09/17/2012  ? Tobacco abuse 09/17/2012  ?  Obesity (BMI 30-39.9) 09/17/2012  ? ?Past Medical History:  ?Diagnosis Date  ? Chronic back pain   ? Diverticulosis   ? a. s/p partial colectomy 2005 for anastamotic leak with reversal.  ? History of kidney stones   ? Hyperlipidemia   ? Hypertension   ? Normal cardiac stress test 2015  ? low risk  ? Obesity   ? Sciatica, left side   ? Tobacco abuse   ?  ?Past Surgical History:  ?Procedure Laterality Date  ? BACK SURGERY  2021  ? "two-level instrumented fusion without interbody cages" in [place]  ? COLOSTOMY REVERSAL    ? KNEE ARTHROSCOPY Right   ? KNEE ARTHROSCOPY Right 01/20/2013  ? Procedure: ARTHROSCOPY KNEE;  Surgeon: Darreld Mclean, MD;  Location: [facility];  Service: Orthopedics;  Laterality: Right;  ? MENISECTOMY Right 01/20/2013  ? Procedure: partial medial menisectomy;  Surgeon: Darreld Mclean, MD;  Location: [facility];  Service: Orthopedics;  Laterality: Right;  ? PARTIAL COLECTOMY    ? 2 subsequent surgeries to reverse his colostomy-diverticulosis  ? WISDOM TOOTH EXTRACTION    ?  ?No current facility-administered medications for this encounter.  ? ?Current Outpatient Medications  ?Medication Sig Dispense Refill Last Dose  ? amLODipine (NORVASC) 10 MG tablet Take 1 tablet by mouth daily. Appointment needed-1st attempt 30 tablet 0   ? carvedilol (COREG) 25 MG tablet Take 1 tablet (25 mg total) by mouth 2 (two) times daily with a meal. 180 tablet 3   ? ?Allergies  ?Allergen Reactions  ? Penicillins Other (See Comments)  ?  Childhood reaction, caused urticaria   ?  ?Social History  ? ?Tobacco Use  ? Smoking  status: Every Day  ?  Packs/day: 1.00  ?  Years: 25.00  ?  Pack years: 25.00  ?  Types: Cigarettes  ? Smokeless tobacco: Former  ?Substance Use Topics  ? Alcohol use: Yes  ?  Comment: On occasion, 1 or 2 beers per month  ?  ?Family History  ?Problem Relation Age of Onset  ? Hypertension Mother   ? Diverticulosis Mother   ? Sleep apnea Mother   ? Celiac disease Mother   ? Ulcers Mother   ? Cancer Father   ?      Brain  ? Diverticulosis Brother   ? Ulcers Brother   ? Celiac disease Brother   ? Hypotension Brother   ? Cancer Maternal Grandmother   ?     Breast  ? Ulcers Maternal Grandfather   ? ALS Maternal Grandfather   ? Diabetes Maternal Grandfather   ? Heart attack Paternal Grandmother   ? Hypertension Paternal Grandmother   ? Cancer Paternal Grandfather   ?     Melanoma  ? Diverticulosis Daughter   ?  ? ?Review of Systems  ?Constitutional:  Positive for activity change.  ?HENT: Negative.    ?Respiratory: Negative.    ?Gastrointestinal: Negative.   ?Genitourinary: Negative.   ?Musculoskeletal:  Positive for gait problem and joint swelling.  ?Skin: Negative.   ? ?Objective: ? ?Physical Exam ?HENT:  ?   Head: Normocephalic and atraumatic.  ?   Nose: Nose normal.  ?Eyes:  ?   Extraocular Movements: Extraocular movements intact.  ?Cardiovascular:  ?   Rate and Rhythm: Regular rhythm.  ?Pulmonary:  ?   Effort: Pulmonary effort is normal. No respiratory distress.  ?Abdominal:  ?   General: Bowel sounds are normal. There is no distension.  ?   Tenderness: There is no abdominal tenderness.  ?Neurological:  ?   Mental Status: He is alert and oriented to person, place, and time.  ?Psychiatric:     ?   Mood and Affect: Mood normal.  ? ? ?Vital signs in last 24 hours: ?  ? ?Labs: ? ? ?Estimated body mass index is 30.25 kg/m? as calculated from the following: ?  Height as of 03/31/21: 6\' 1"  (1.854 m). ?  Weight as of 03/31/21: 104 kg. ? ? ?Imaging Review ?Plain radiographs demonstrate moderate degenerative joint disease of the left knee(s). The overall alignment ismild varus. The bone quality appears to be good for age and reported activity level. ? ? ? ? ? ?Assessment/Plan: ? ?End stage arthritis, left knee  ? ?The patient history, physical examination, clinical judgment of the provider and imaging studies are consistent with end stage degenerative joint disease of the left knee(s) and total knee arthroplasty is deemed medically  necessary. The treatment options including medical management, injection therapy arthroscopy and arthroplasty were discussed at length. The risks and benefits of total knee arthroplasty were presented and reviewed. The risks due to aseptic loosening, infection, stiffness, patella tracking problems, thromboembolic complications and other imponderables were discussed. The patient acknowledged the explanation, agreed to proceed with the plan and consent was signed. Patient is being admitted for inpatient treatment for surgery, pain control, PT, OT, prophylactic antibiotics, VTE prophylaxis, progressive ambulation and ADL's and discharge planning. The patient is planning to be discharged home with home health services ? ? ? ? ?Patient's anticipated LOS is less than 2 midnights, meeting these requirements: ?- Younger than 65 ?- Lives within 1 hour of care ?- Has a competent adult at home  to recover with post-op recover ?- NO history of ? - Chronic pain requiring opiods ? - Diabetes ? - Coronary Artery Disease ? - Heart failure ? - Heart attack ? - Stroke ? - DVT/VTE ? - Cardiac arrhythmia ? - Respiratory Failure/COPD ? - Renal failure ? - Anemia ? - Advanced Liver disease ? ? ?

## 2021-04-04 NOTE — Anesthesia Procedure Notes (Signed)
Anesthesia Procedure Image    

## 2021-04-04 NOTE — Op Note (Signed)
Pre and postop diagnosis: Left knee primary osteoarthritis ? ?Procedure: Left total knee arthroplasty. ? ?Surgeon: Annell Greening, MD ? ?Assistant: Zonia Kief, PA-C medically necessary and present for the entire procedure ? ?Anesthesia preoperative abductor block plus spinal plus Exparel and Marcaine 20+20. ? ?Tourniquet: 300 pressure times less than 50 minutes. ? ?Implants:Implants ? ?CEMENT HV SMART SET - [id number] ? ?Inventory Item: CEMENT HV SMART SET Serial no.:  Model/Cat no.: [id number]  ?Implant name: CEMENT HV SMART SET - [id number] Laterality: Left Area: Knee  ?Manufacturer: Jasmine December Date of Manufacture:    ?Action: Implanted Number Used: 1   ?Device Identifier:  Device Identifier Type:    ? ?CEMENT HV SMART SET - [id number] ? ?Inventory Item: CEMENT HV SMART SET Serial no.:  Model/Cat no.: [id number]  ?Implant name: CEMENT HV SMART SET - [id number] Laterality: Left Area: Knee  ?Manufacturer: Jasmine December Date of Manufacture:    ?Action: Implanted Number Used: 1   ?Device Identifier:  Device Identifier Type:    ? ?PATELLA MEDIAL ATTUN KNEE - [id number] ? ?Inventory Item: PATELLA MEDIAL ATTUN KNEE Serial no.:  Model/Cat no.: [id number]  ?Implant name: PATELLA MEDIAL ATTUN KNEE - [id number] Laterality: Left Area: Knee  ?Manufacturer: Jasmine December Date of Manufacture:    ?Action: Implanted Number Used: 1   ?Device Identifier:  Device Identifier Type:    ? ?ATTUNE PSRP INSR SZ6 KNEE - [id number] ? ?Inventory Item: ATTUNE PSRP INSR SZ6 KNEE Serial no.:  Model/Cat no.: [id number]  ?Implant name: ATTUNE PSRP INSR SZ6 KNEE - [id number] Laterality: Left Area: Knee  ?Manufacturer: Jasmine December Date of Manufacture:    ?Action: Implanted Number Used: 1   ?Device Identifier:  Device Identifier Type:    ? ?ATTUNE PS FEM LT SZ 6 CEM KNEE - [id number] ? ?Inventory Item: ATTUNE PS FEM LT SZ 6 CEM KNEE Serial no.:  Model/Cat no.: [id number]  ?Implant name: ATTUNE PS FEM LT SZ 6 CEM KNEE -  [id number] Laterality: Left Area: Knee  ?Manufacturer: Jasmine December Date of Manufacture:    ?Action: Implanted Number Used: 1   ?Device Identifier:  Device Identifier Type:    ? ?TIBIA ATTUNE KNEE SYS BASE SZ6 - [id number] ? ?Inventory Item: TIBIA ATTUNE KNEE SYS BASE SZ6 Serial no.:  Model/Cat no.: [id number]  ?Implant name: TIBIA ATTUNE KNEE SYS BASE SZ6 - [id number] Laterality: Left Area: Knee  ?Manufacturer: Jasmine December Date of Manufacture:    ?Action: Implanted Number Used: 1   ?Device Identifier:  Device Identifier Type:    ?Procedure: After induction of block spinal anesthesia lateral post heel bump proximal thigh tourniquet prepping from the toes to the tourniquet with DuraPrep usual total knee sheets drapes were applied IV TXA vancomycin was given due to penicillin allergy.  Timeout procedure was completed.  Leg was wrapped with an Esmarch tourniquet inflated Betadine Steri-Drape had been used to seal the skin.  Midline incision was made medial parapatellar incision 10 mm resected off the patella.  Intramedullary hole drilled in the femur 10 mm distal cut 10 on the tibia.  Chamfer cuts made on the femur, box cut was made on the femur and patient was size 6 on the femur and size 6 on the tibia.  Lug nuts with the drilling after preparation for the 3 peg patella pulsatile lavage back to mixing of the cement.  Patient had tricompartmental degenerative changes in the knee worse in the medial compartment with erosive changes.  Tibia cemented first followed by  femur placement of the permanent 5 mm rotating platform poly and then holding the patella with the self-retaining patella clamp all excessive cement was removed.  Cement was hardened 15 minutes Exparel was injected in the last 3 minutes while cement was setting up.  This was mixed with Marcaine 20+20 = 40 cc.  Hemostasis obtained with tourniquet deflated.  Standard layered closure absorbable Vicryl in the quad tendon medial retinaculum.  2-0 Vicryl  subtenons tissue.  Skin staple closure postop dressing knee immobilizer. ?

## 2021-04-04 NOTE — Anesthesia Procedure Notes (Signed)
Date/Time: 04/04/2021 10:10 AM ?Performed by: Michele Rockers, CRNA ?Pre-anesthesia Checklist: Patient identified, Emergency Drugs available, Suction available, Timeout performed and Patient being monitored ?Patient Re-evaluated:Patient Re-evaluated prior to induction ?Oxygen Delivery Method: Simple face mask ? ? ? ? ?

## 2021-04-04 NOTE — Progress Notes (Signed)
Orthopedic Tech Progress Note ?Patient Details:  ?Allison Silva Rullo ?1965-09-07 ?[id number] ? ?PACU RN called requesting a CPM ? ?CPM Left Knee ?CPM Left Knee: On ?Left Knee Flexion (Degrees): 0 ?Left Knee Extension (Degrees): 60 ?Additional Comments: fresh ice ? ?Post Interventions ?Patient Tolerated: Well ?Instructions Provided: Care of device ? ?Donald Pore ?04/04/2021, 1:05 PM ? ?

## 2021-04-04 NOTE — Transfer of Care (Signed)
Immediate Anesthesia Transfer of Care Note ? ?Patient: Brent Nguyen ? ?Procedure(s) Performed: Left TOTAL KNEE ARTHROPLASTY (Left: Knee) ? ?Patient Location: PACU ? ?Anesthesia Type:MAC combined with regional for post-op pain ? ?Level of Consciousness: drowsy, patient cooperative and responds to stimulation ? ?Airway & Oxygen Therapy: Patient Spontanous Breathing and Patient connected to nasal cannula oxygen ? ?Post-op Assessment: Report given to RN and Post -op Vital signs reviewed and stable ? ?Post vital signs: Reviewed and stable ? ?Last Vitals:  ?Vitals Value Taken Time  ?BP    ?Temp    ?Pulse    ?Resp 14 04/04/21 1206  ?SpO2    ?Vitals shown include unvalidated device data. ? ?Last Pain:  ?Vitals:  ? 04/04/21 0742  ?TempSrc:   ?PainSc: 0-No pain  ?   ? ?  ? ?Complications: No notable events documented. ?

## 2021-04-04 NOTE — Anesthesia Procedure Notes (Signed)
Spinal ? ?Patient location during procedure: OR ?Start time: 04/04/2021 10:14 AM ?End time: 04/04/2021 10:17 AM ?Reason for block: surgical anesthesia ?Staffing ?Performed: anesthesiologist  ?Anesthesiologist: Myrtie Soman, MD ?Preanesthetic Checklist ?Completed: patient identified, IV checked, site marked, risks and benefits discussed, surgical consent, monitors and equipment checked, pre-op evaluation and timeout performed ?Spinal Block ?Patient position: sitting ?Prep: Betadine ?Patient monitoring: heart rate, continuous pulse ox and blood pressure ?Approach: midline ?Location: L3-4 ?Injection technique: single-shot ?Needle ?Needle type: Sprotte  ?Needle gauge: 24 G ?Needle length: 9 cm ?Assessment ?Sensory level: T6 ?Events: CSF return ?Additional Notes ? ? ? ? ? ? ?

## 2021-04-05 ENCOUNTER — Telehealth: Payer: Self-pay | Admitting: Orthopaedic Surgery

## 2021-04-05 ENCOUNTER — Encounter (HOSPITAL_COMMUNITY): Payer: Self-pay | Admitting: Orthopaedic Surgery

## 2021-04-05 DIAGNOSIS — M1712 Unilateral primary osteoarthritis, left knee: Secondary | ICD-10-CM | POA: Diagnosis not present

## 2021-04-05 LAB — BASIC METABOLIC PANEL
Anion gap: 7 (ref 5–15)
BUN: 13 mg/dL (ref 6–20)
CO2: 25 mmol/L (ref 22–32)
Calcium: 8.9 mg/dL (ref 8.9–10.3)
Chloride: 102 mmol/L (ref 98–111)
Creatinine, Ser: 0.89 mg/dL (ref 0.61–1.24)
GFR, Estimated: >60 mL/min (ref >60)
Glucose, Bld: 129 mg/dL — ABNORMAL HIGH (ref 70–99)
Potassium: 3.9 mmol/L (ref 3.5–5.1)
Sodium: 134 mmol/L — ABNORMAL LOW (ref 135–145)

## 2021-04-05 LAB — CBC
HCT: 40.9 % (ref 39.0–52.0)
Hemoglobin: 14.1 g/dL (ref 13.0–17.0)
MCH: 30.5 pg (ref 26.0–34.0)
MCHC: 34.5 g/dL (ref 30.0–36.0)
MCV: 88.5 fL (ref 80.0–100.0)
Platelets: 234 10*3/uL (ref 150–400)
RBC: 4.62 MIL/uL (ref 4.22–5.81)
RDW: 12.8 % (ref 11.5–15.5)
WBC: 14.3 10*3/uL — ABNORMAL HIGH (ref 4.0–10.5)
nRBC: 0 % (ref 0.0–0.2)

## 2021-04-05 MED ORDER — OXYCODONE-ACETAMINOPHEN 5-325 MG PO TABS: 1.0000 | ORAL_TABLET | Freq: Four times a day (QID) | ORAL | 0 refills | Status: DC | PRN

## 2021-04-05 MED ORDER — METHOCARBAMOL 500 MG PO TABS: 500.0000 mg | ORAL_TABLET | Freq: Four times a day (QID) | ORAL | 0 refills | Status: DC | PRN

## 2021-04-05 MED ORDER — OXYCODONE-ACETAMINOPHEN 5-325 MG PO TABS
1.0000 | ORAL_TABLET | ORAL | 0 refills | Status: DC | PRN
Start: 2021-04-05 — End: 2021-04-05

## 2021-04-05 MED ORDER — ASPIRIN 325 MG PO TBEC: 325.0000 mg | DELAYED_RELEASE_TABLET | Freq: Every day | ORAL | 0 refills | Status: DC

## 2021-04-05 NOTE — Progress Notes (Signed)
Subjective: ?Patient doing well.  Does complain of some pain right knee.  Was about to start working with PT before my visit with him.  ? ?Objective: ?Vital signs in last 24 hours: ?Temp:  [97.4 ?F (36.3 ?C)-98 ?F (36.7 ?C)] 97.8 ?F (36.6 ?C) (03/07 0746) ?Pulse Rate:  [49-86] 65 (03/07 0746) ?Resp:  [14-20] 19 (03/07 0746) ?BP: (103-159)/(75-98) 142/93 (03/07 0746) ?SpO2:  [92 %-97 %] 95 % (03/07 0746) ? ?Intake/Output from previous day: ?03/06 0701 - 03/07 0700 ?In: 2380 [P.O.:480; I.V.:1500; IV Piggyback:200] ?Out: 1665 [Urine:1565; Blood:100] ?Intake/Output this shift: ?Total I/O ?In: 120 [P.O.:120] ?Out: 200 [Urine:200] ? ?Recent Labs  ?  04/05/21 ?0139  ?HGB 14.1  ? ?Recent Labs  ?  04/05/21 ?0139  ?WBC 14.3*  ?RBC 4.62  ?HCT 40.9  ?PLT 234  ? ?Recent Labs  ?  04/05/21 ?0139  ?NA 134*  ?K 3.9  ?CL 102  ?CO2 25  ?BUN 13  ?CREATININE 0.89  ?GLUCOSE 129*  ?CALCIUM 8.9  ? ?No results for input(s): LABPT, INR in the last 72 hours. ? ?Exam ?Very pleasant male alert and oriented in no acute distress.  Wound looks good.  Staples intact.  No drainage or signs of infection.  Calf nontender. ? ? ? ?Assessment/Plan: ?We will see how patient does with PT today.  Possible discharge home this afternoon or tomorrow.  Sent in prescriptions for Percocet, Robaxin and aspirin to his pharmacy. ? ? ? ? ?Zonia Kief ?04/05/2021, 12:35 PM  ? ? ? ? ?

## 2021-04-05 NOTE — Discharge Instructions (Addendum)
INSTRUCTIONS AFTER JOINT REPLACEMENT   Remove items at home which could result in a fall. This includes throw rugs or furniture in walking pathways ICE to the affected joint every three hours while awake for 30 minutes at a time, for at least the first 3-5 days, and then as needed for pain and swelling.  Continue to use ice for pain and swelling. You may notice swelling that will progress down to the foot and ankle.  This is normal after surgery.  Elevate your leg when you are not up walking on it.   Continue to use the breathing machine you got in the hospital (incentive spirometer) which will help keep your temperature down.  It is common for your temperature to cycle up and down following surgery, especially at night when you are not up moving around and exerting yourself.  The breathing machine keeps your lungs expanded and your temperature down.   DIET:  As you were doing prior to hospitalization, we recommend a well-balanced diet.  DRESSING / WOUND CARE / SHOWERING  You may change your dressing 3-5 days after surgery.  Then change the dressing every day with sterile gauze.  Please use good hand washing techniques before changing the dressing.  Do not use any lotions or creams on the incision until instructed by your surgeon.  ACTIVITY  Increase activity slowly as tolerated, but follow the weight bearing instructions below.   No driving for 6 weeks or until further direction given by your physician.  You cannot drive while taking narcotics.  No lifting or carrying greater than 10 lbs. until further directed by your surgeon. Avoid periods of inactivity such as sitting longer than an hour when not asleep. This helps prevent blood clots.  You may return to work once you are authorized by your doctor.     WEIGHT BEARING   Weight bearing as tolerated with assist device (walker, cane, etc) as directed, use it as long as suggested by your surgeon or therapist, typically at least 4-6 weeks.   Home health physical therapist will wean you out of knee immobilizer as gait and quad strength improved   EXERCISES  Results after joint replacement surgery are often greatly improved when you follow the exercise, range of motion and muscle strengthening exercises prescribed by your doctor. Safety measures are also important to protect the joint from further injury. Any time any of these exercises cause you to have increased pain or swelling, decrease what you are doing until you are comfortable again and then slowly increase them. If you have problems or questions, call your caregiver or physical therapist for advice.   Rehabilitation is important following a joint replacement. After just a few days of immobilization, the muscles of the leg can become weakened and shrink (atrophy).  These exercises are designed to build up the tone and strength of the thigh and leg muscles and to improve motion. Often times heat used for twenty to thirty minutes before working out will loosen up your tissues and help with improving the range of motion but do not use heat for the first two weeks following surgery (sometimes heat can increase post-operative swelling).   These exercises can be done on a training (exercise) mat, on the floor, on a table or on a bed. Use whatever works the best and is most comfortable for you.    Use music or television while you are exercising so that the exercises are a pleasant break in your day. This will make  your life better with the exercises acting as a break in your routine that you can look forward to.   Perform all exercises about fifteen times, three times per day or as directed.  You should exercise both the operative leg and the other leg as well.  Exercises include:   Quad Sets - Tighten up the muscle on the front of the thigh (Quad) and hold for 5-10 seconds.   Straight Leg Raises - With your knee straight (if you were given a brace, keep it on), lift the leg to 60 degrees,  hold for 3 seconds, and slowly lower the leg.  Perform this exercise against resistance later as your leg gets stronger.  Leg Slides: Lying on your back, slowly slide your foot toward your buttocks, bending your knee up off the floor (only go as far as is comfortable). Then slowly slide your foot back down until your leg is flat on the floor again.  Angel Wings: Lying on your back spread your legs to the side as far apart as you can without causing discomfort.  Hamstring Strength:  Lying on your back, push your heel against the floor with your leg straight by tightening up the muscles of your buttocks.  Repeat, but this time bend your knee to a comfortable angle, and push your heel against the floor.  You may put a pillow under the heel to make it more comfortable if necessary.   A rehabilitation program following joint replacement surgery can speed recovery and prevent re-injury in the future due to weakened muscles. Contact your doctor or a physical therapist for more information on knee rehabilitation.    CONSTIPATION  Constipation is defined medically as fewer than three stools per week and severe constipation as less than one stool per week.  Even if you have a regular bowel pattern at home, your normal regimen is likely to be disrupted due to multiple reasons following surgery.  Combination of anesthesia, postoperative narcotics, change in appetite and fluid intake all can affect your bowels.   YOU MUST use at least one of the following options; they are listed in order of increasing strength to get the job done.  They are all available over the counter, and you may need to use some, POSSIBLY even all of these options:    Drink plenty of fluids (prune juice may be helpful) and high fiber foods Colace 100 mg by mouth twice a day  Senokot for constipation as directed and as needed Dulcolax (bisacodyl), take with full glass of water  Miralax (polyethylene glycol) once or twice a day as  needed.  If you have tried all these things and are unable to have a bowel movement in the first 3-4 days after surgery call either your surgeon or your primary doctor.    If you experience loose stools or diarrhea, hold the medications until you stool forms back up.  If your symptoms do not get better within 1 week or if they get worse, check with your doctor.  If you experience "the worst abdominal pain ever" or develop nausea or vomiting, please contact the office immediately for further recommendations for treatment.   ITCHING:  If you experience itching with your medications, try taking only a single pain pill, or even half a pain pill at a time.  You can also use Benadryl over the counter for itching or also to help with sleep.   TED HOSE STOCKINGS:  Use stockings on both legs until for at  least 2 weeks or as directed by physician office. They may be removed at night for sleeping.  MEDICATIONS:  See your medication summary on the After Visit Summary that nursing will review with you.  You may have some home medications which will be placed on hold until you complete the course of blood thinner medication.  It is important for you to complete the blood thinner medication as prescribed.  PRECAUTIONS:  If you experience chest pain or shortness of breath - call 911 immediately for transfer to the hospital emergency department.   If you develop a fever greater that 101 F, purulent drainage from wound, increased redness or drainage from wound, foul odor from the wound/dressing, or calf pain - CONTACT YOUR SURGEON.                                                   FOLLOW-UP APPOINTMENTS:  If you do not already have a post-op appointment, please call the office for an appointment to be seen by your surgeon.  Guidelines for how soon to be seen are listed in your After Visit Summary, but are typically between 1-4 weeks after surgery.  OTHER INSTRUCTIONS:   Knee Replacement:  Do not place pillow  under knee, focus on keeping the knee straight while resting. CPM instructions: 0-90 degrees, 2 hours in the morning, 2 hours in the afternoon, and 2 hours in the evening. Place foam block, curve side up under heel at all times except when in CPM or when walking.  DO NOT modify, tear, cut, or change the foam block in any way.  POST-OPERATIVE OPIOID TAPER INSTRUCTIONS: It is important to wean off of your opioid medication as soon as possible. If you do not need pain medication after your surgery it is ok to stop day one. Opioids include: Codeine, Hydrocodone(Norco, Vicodin), Oxycodone(Percocet, oxycontin) and hydromorphone amongst others.  Long term and even short term use of opiods can cause: Increased pain response Dependence Constipation Depression Respiratory depression And more.  Withdrawal symptoms can include Flu like symptoms Nausea, vomiting And more Techniques to manage these symptoms Hydrate well Eat regular healthy meals Stay active Use relaxation techniques(deep breathing, meditating, yoga) Do Not substitute Alcohol to help with tapering If you have been on opioids for less than two weeks and do not have pain than it is ok to stop all together.  Plan to wean off of opioids This plan should start within one week post op of your joint replacement. Maintain the same interval or time between taking each dose and first decrease the dose.  Cut the total daily intake of opioids by one tablet each day Next start to increase the time between doses. The last dose that should be eliminated is the evening dose.   MAKE SURE YOU:  Understand these instructions.  Get help right away if you are not doing well or get worse.    Thank you for letting us be a part of your medical care team.  It is a privilege we respect greatly.  We hope these instructions will help you stay on track for a fast and full recovery!     Dental Antibiotics:  In most cases prophylactic antibiotics for  Dental procdeures after total joint surgery are not necessary.  Exceptions are as follows:  1. History of prior total joint infection  2. Severely immunocompromised (Organ Transplant, cancer chemotherapy, Rheumatoid biologic meds such as Humera)  3. Poorly controlled diabetes (A1C &gt; 8.0, blood glucose over 200)  If you have one of these conditions, contact your surgeon for an antibiotic prescription, prior to your dental procedure.

## 2021-04-05 NOTE — Telephone Encounter (Signed)
noted 

## 2021-04-05 NOTE — Plan of Care (Signed)

## 2021-04-05 NOTE — Telephone Encounter (Signed)
I spoke with patient. He would like Oxycodone to be sent to [facility].  He needs for the aspirin and methocarbamol to stay at [facility] as this is what his insurance prefers. He will pay out of pocket for the pain med at [place]. ? ?I spoke with Shival at [facility] and canceled rx for Oxycodone. He states that there is a back order issue for Oxycodone 5/325 and Oxycodone 10/325, as well as Hydrocodone 5/325.  Asa and robaxin are ready for pick up. ? ?Please send Oxycodone rx to [facility]. ?

## 2021-04-05 NOTE — Telephone Encounter (Signed)
I spoke with patient and advised rx has been printed and is with discharge paperwork. He has not left hospital yet so he has not received the paperwork. I explained that he could have someone there at the hospital check to make sure rx is with discharge paperwork. He will call back if continued problems/questions. ?

## 2021-04-05 NOTE — Progress Notes (Signed)
Physical Therapy Treatment ?Patient Details ?Name: Brent Nguyen ?MRN: [id number] ?DOB: 03-13-1965 ?Today's Date: 04/05/2021 ? ? 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 PMH includes HTN, tobacco use, back sx, multiple orthopedic injuries and procedures. ?  ?PT Comments  ? ? Pt progressing well with mobility; able to transfer, ambulate and perform stair training at supervision-level. Reviewed education, including therex (HEP provided). Pt hopeful for d/c home this afternoon, will have necessary DME and assist from family. ?   ?Recommendations for follow up therapy are one component of a multi-disciplinary discharge planning process, led by the attending physician.  Recommendations may be updated based on patient status, additional functional criteria and insurance authorization. ? ?Follow Up Recommendations ? Follow physician's recommendations for discharge plan and follow up therapies ?  ?  ?Assistance Recommended at Discharge Intermittent Supervision/Assistance  ?Patient can return home with the following Assistance with cooking/housework;Assist for transportation;A little help with bathing/dressing/bathroom ?  ?Equipment Recommendations ? None recommended by PT  ?  ?Recommendations for Other Services   ? ? ?  ?Precautions / Restrictions Precautions ?Precautions: Knee;Fall ?Required Braces or Orthoses: Knee Immobilizer - Left ?Knee Immobilizer - Left: On except when in CPM (except when performing exercises or in CPM per MD request) ?Restrictions ?Weight Bearing Restrictions: Yes ?LLE Weight Bearing: Weight bearing as tolerated ?Other Position/Activity Restrictions: KI on with mobility tasks per MD request  ?  ? ?Mobility ? Bed Mobility ?Overal bed mobility: Modified Independent ?Bed Mobility: Supine to Sit ?  ?  ?  ?  ?  ?General bed mobility comments: HOB elevated, able to perform supine<>sit 2x without assist; use of UE to assist LLE to EOB ?  ? ?Transfers ?Overall transfer  level: Needs assistance ?Equipment used: Rolling walker (2 wheels) ?Transfers: Sit to/from Stand ?Sit to Stand: Supervision ?  ?  ?  ?  ?  ?General transfer comment: multiple sit<>stands from EOB to RW, cues for sequencing and hand placement, pt Janis opting to push up with BUE support on RW; min guard for balance ?  ? ?Ambulation/Gait ?Ambulation/Gait assistance: Supervision ?Gait Distance (Feet): 260 Feet ?Assistive device: Rolling walker (2 wheels) ?Gait Pattern/deviations: Step-through pattern, Decreased stride length, Decreased weight shift to left, Trunk flexed, Antalgic (L knee immobilizer donned, knee ROM limited) ?Gait velocity: Decreased ?  ?  ?General Gait Details: Slow, antalgic gait with RW and supervision for safety ? ? ?Stairs ?Stairs: Yes ?Stairs assistance: Supervision ?Stair Management: Two rails, Step to pattern, Forwards ?Number of Stairs: 3 ?General stair comments: ascend/descend 3 steps with rail support, supervision for safety; educ on technique ? ? ?Wheelchair Mobility ?  ? ?Modified Rankin (Stroke Patients Only) ?  ? ? ?  ?Balance Overall balance assessment: Needs assistance ?Sitting-balance support: No upper extremity supported ?Sitting balance-Leahy Scale: Good ?  ?  ?Standing balance support: No upper extremity supported ?Standing balance-Leahy Scale: Fair ?Standing balance comment: can static stand to void at toilet without UE support; needing RW to offload painful LLE with ambulation ?  ?  ?  ?  ?  ?  ?  ?  ?  ?  ?  ?  ? ?  ?Cognition Arousal/Alertness: Awake/alert ?Behavior During Therapy: [facility] for tasks assessed/performed ?Overall Cognitive Status: Within Functional Limits for tasks assessed ?  ?  ?  ?  ?  ?  ?  ?  ?  ?  ?  ?  ?  ?  ?  ?  ?  ?  ?  ? ?  ?  Exercises Other Exercises ?Other Exercises: [employer] HEP handout (Access Code [email or web address]) provided and educ on - quad set, heel slide (w/ strap), LAQ, calf stretch (w/ strap) ? ?  ?General Comments General comments (skin integrity,  edema, etc.): reviewed educ re: precautions, positioning, KI wear, therex (HEP provided), activity recommendations. Pt reports plan is for HHPT; no DME needs, owns RW. educ on compensatory strategies for LB ADL tasks, sitting as able for ADLs to reduce fall risk ?  ?  ? ?Pertinent Vitals/Pain Pain Assessment ?Pain Assessment: Faces ?Faces Pain Scale: Hurts little more ?Pain Location: L knee ?Pain Descriptors / Indicators: Grimacing, Guarding ?Pain Intervention(s): Monitored during session, Limited activity within patient's tolerance  ? ? ?Home Living   ?  ?  ?  ?  ?  ?  ?  ?  ?  ?   ?  ?Prior Function    ?  ?  ?   ? ?PT Goals (current goals can now be found in the care plan section) Progress towards PT goals: Progressing toward goals ? ?  ?Frequency ? ? ? 7X/week ? ? ? ?  ?PT Plan Current plan remains appropriate  ? ? ?Co-evaluation   ?  ?  ?  ?  ? ?  ?AM-PAC PT "6 Clicks" Mobility   ?Outcome Measure ? Help needed turning from your back to your side while in a flat bed without using bedrails?: None ?Help needed moving from lying on your back to sitting on the side of a flat bed without using bedrails?: None ?Help needed moving to and from a bed to a chair (including a wheelchair)?: A Little ?Help needed standing up from a chair using your arms (e.g., wheelchair or bedside chair)?: A Little ?Help needed to walk in hospital room?: A Little ?Help needed climbing 3-5 steps with a railing? : A Little ?6 Click Score: 20 ? ?  ?End of Session Equipment Utilized During Treatment: Gait belt;Left knee immobilizer ?Activity Tolerance: Patient tolerated treatment well ?Patient left: in bed;with call bell/phone within reach ?Nurse Communication: Mobility status ?PT Visit Diagnosis: Unsteadiness on feet (R26.81);Muscle weakness (generalized) (M62.81);Difficulty in walking, not elsewhere classified (R26.2) ?  ? ? ?Time: 2542-7062 ?PT Time Calculation (min) (ACUTE ONLY): 30 min ? ?Charges:  $Gait Training: 8-22 mins ?$Therapeutic  Exercise: 8-22 mins          ?          ? ?Ina Homes, PT, DPT ?Acute Rehabilitation Services  ?Pager [phone] ?Office [phone] ? ?Malachy Chamber ?04/05/2021, 4:48 PM ? ?

## 2021-04-05 NOTE — Progress Notes (Signed)
Physical Therapy Treatment ?Patient Details ?Name: Brent Nguyen ?MRN: [id number] ?DOB: [birth year] ?Today's Date: 04/05/2021 ? ? 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 PMH includes HTN, tobacco use, back sx, multiple orthopedic injuries and procedures. ?  ?PT Comments  ? ? Pt progressing with mobility. Today's session focused on transfer and gait training with RW; pt moving well with intermittent min guard for balance. Educ re: precautions, positioning, KI wear, therex, activity recommendations. Pt hopeful for discharge following afternoon Physical Therapy session. ?   ?Recommendations for follow up therapy are one component of a multi-disciplinary discharge planning process, led by the attending physician.  Recommendations may be updated based on patient status, additional functional criteria and insurance authorization. ? ?Follow Up Recommendations ? Follow physician's recommendations for discharge plan and follow up therapies ?  ?  ?Assistance Recommended at Discharge Intermittent Supervision/Assistance  ?Patient can return home with the following Help with stairs or ramp for entrance;Assistance with cooking/housework;Assist for transportation;A little help with bathing/dressing/bathroom ?  ?Equipment Recommendations ? None recommended by PT  ?  ?Recommendations for Other Services   ? ? ?  ?Precautions / Restrictions Precautions ?Precautions: Knee;Fall ?Required Braces or Orthoses: Knee Immobilizer - Left ?Knee Immobilizer - Left: On except when in CPM (except when performing exercises or in CPM per MD request) ?Restrictions ?Weight Bearing Restrictions: Yes ?LLE Weight Bearing: Weight bearing as tolerated ?Other Position/Activity Restrictions: KI on with mobility tasks per MD request  ?  ? ?Mobility ? Bed Mobility ?Overal bed mobility: Modified Independent ?Bed Mobility: Supine to Sit ?  ?  ?  ?  ?  ?General bed mobility comments: HOB elevated, able to perform supine<>sit 2x  without assist; use of UE to assist LLE to EOB ?  ? ?Transfers ?Overall transfer level: Needs assistance ?Equipment used: Rolling walker (2 wheels) ?Transfers: Sit to/from Stand ?Sit to Stand: Min guard ?  ?  ?  ?  ?  ?General transfer comment: multiple sit<>stands from EOB to RW, cues for sequencing and hand placement, pt Hundal opting to push up with BUE support on RW; min guard for balance ?  ? ?Ambulation/Gait ?Ambulation/Gait assistance: Min guard ?Gait Distance (Feet): 280 Feet ?Assistive device: Rolling walker (2 wheels) ?Gait Pattern/deviations: Step-through pattern, Decreased stride length, Decreased weight shift to left, Trunk flexed, Antalgic (L knee immobilizer donned, knee ROM limited) ?Gait velocity: Decreased ?  ?  ?General Gait Details: Slow, antalgic gait with RW and intermittent min guard for balance; cues for sequencing and RW management; 1x seated rest break ? ? ?Stairs ?  ?  ?  ?  ?  ? ? ?Wheelchair Mobility ?  ? ?Modified Rankin (Stroke Patients Only) ?  ? ? ?  ?Balance Overall balance assessment: Needs assistance ?Sitting-balance support: No upper extremity supported ?Sitting balance-Leahy Scale: Good ?  ?  ?Standing balance support: No upper extremity supported ?Standing balance-Leahy Scale: Fair ?Standing balance comment: can static stand to void at toilet without UE support ?  ?  ?  ?  ?  ?  ?  ?  ?  ?  ?  ?  ? ?  ?Cognition Arousal/Alertness: Awake/alert ?Behavior During Therapy: [facility] for tasks assessed/performed ?Overall Cognitive Status: Within Functional Limits for tasks assessed ?  ?  ?  ?  ?  ?  ?  ?  ?  ?  ?  ?  ?  ?  ?  ?  ?  ?  ?  ? ?  ?  Exercises   ? ?  ?General Comments General comments (skin integrity, edema, etc.): Ortho PA present during session to remove ace wrap and gauze, dressing and KI donned. Reviewed educ with pt re: precautions, positioning, KI wear, edema control, therex, activity recommendations ?  ?  ? ?Pertinent Vitals/Pain Pain Assessment ?Pain Assessment:  Faces ?Faces Pain Scale: Hurts even more ?Pain Location: L knee ?Pain Descriptors / Indicators: Grimacing, Guarding ?Pain Intervention(s): Limited activity within patient's tolerance, Monitored during session, Repositioned  ? ? ?Home Living   ?  ?  ?  ?  ?  ?  ?  ?  ?  ?   ?  ?Prior Function    ?  ?  ?   ? ?PT Goals (current goals can now be found in the care plan section) Progress towards PT goals: Progressing toward goals ? ?  ?Frequency ? ? ? 7X/week ? ? ? ?  ?PT Plan Current plan remains appropriate  ? ? ?Co-evaluation   ?  ?  ?  ?  ? ?  ?AM-PAC PT "6 Clicks" Mobility   ?Outcome Measure ? Help needed turning from your back to your side while in a flat bed without using bedrails?: None ?Help needed moving from lying on your back to sitting on the side of a flat bed without using bedrails?: None ?Help needed moving to and from a bed to a chair (including a wheelchair)?: A Little ?Help needed standing up from a chair using your arms (e.g., wheelchair or bedside chair)?: A Little ?Help needed to walk in hospital room?: A Little ?Help needed climbing 3-5 steps with a railing? : A Little ?6 Click Score: 20 ? ?  ?End of Session Equipment Utilized During Treatment: Left knee immobilizer ?Activity Tolerance: Patient tolerated treatment well ?Patient left: in chair;with call bell/phone within reach;with chair alarm set ?Nurse Communication: Mobility status ?PT Visit Diagnosis: Unsteadiness on feet (R26.81);Muscle weakness (generalized) (M62.81);Difficulty in walking, not elsewhere classified (R26.2) ?  ? ? ?Time: [id number] ?PT Time Calculation (min) (ACUTE ONLY): 29 min ? ?Charges:  $Gait Training: 8-22 mins ?$Therapeutic Activity: 8-22 mins          ?          ? ?Mabeline Caras, PT, DPT ?Acute Rehabilitation Services  ?Pager [phone] ?Office [phone] ? ?Derry Lory ?04/05/2021, 12:32 PM ? ?

## 2021-04-05 NOTE — Telephone Encounter (Signed)
Received call from Shival-pharmacist with [facility]. He advised he received a Rx for Oxycodone 5-325 50 tablets. ? ?He advised only have 29 tabs in stock of the Oxycodone. Shival said he do not know when it will be in stock. Rayna Sexton said he also called the patient and left him a message. The  number to contact Rayna Sexton is [phone] ?

## 2021-04-05 NOTE — Telephone Encounter (Signed)
Paitent has now called [place] and says that the prescription for pain meds did not go to [facility].  I looked in chart, it appears Fayrene Fearing entered the Rx, but it printed vs going to pharmacy.  Can you get this sent ASAP?  Patient asks for a call back to let him know what is going on.  He is at [phone]. ?

## 2021-04-06 ENCOUNTER — Other Ambulatory Visit (HOSPITAL_COMMUNITY): Payer: Self-pay

## 2021-04-06 DIAGNOSIS — M1712 Unilateral primary osteoarthritis, left knee: Secondary | ICD-10-CM | POA: Diagnosis not present

## 2021-04-06 LAB — CBC
HCT: 40.8 % (ref 39.0–52.0)
Hemoglobin: 13.6 g/dL (ref 13.0–17.0)
MCH: 29.6 pg (ref 26.0–34.0)
MCHC: 33.3 g/dL (ref 30.0–36.0)
MCV: 88.7 fL (ref 80.0–100.0)
Platelets: 233 10*3/uL (ref 150–400)
RBC: 4.6 MIL/uL (ref 4.22–5.81)
RDW: 13.1 % (ref 11.5–15.5)
WBC: 15.3 10*3/uL — ABNORMAL HIGH (ref 4.0–10.5)
nRBC: 0 % (ref 0.0–0.2)

## 2021-04-06 MED ORDER — OXYCODONE-ACETAMINOPHEN 5-325 MG PO TABS
1.0000 | ORAL_TABLET | Freq: Four times a day (QID) | ORAL | 0 refills | Status: DC | PRN
  Filled 2021-04-06: qty 50, 6d supply, fill #0

## 2021-04-06 MED ORDER — OXYCODONE HCL 5 MG PO TABS: 5.0000 mg | ORAL_TABLET | ORAL | Status: DC | PRN

## 2021-04-06 NOTE — TOC Transition Note (Addendum)
Transition of Care (TOC) - CM/SW Discharge Note ? ? ?Patient Details  ?Name: Brent Nguyen ?MRN: [id number] ?Date of Birth: 26-Aug-1965 ? ?Transition of Care [facility]) CM/SW Contact:  ?Epifanio Lesches, RN ?Phone Number: ?04/06/2021, 11:56 AM ? ? ?Clinical Narrative:    ?Patient will DC to: home ?Anticipated DC date: 04/06/2021 ?Family notified: yes ?Transport by: car ? ?       - L TKA on 04/04/21 ?Per MD patient ready for DC today. RN, patient,  and patient's wife notified of DC. Order noted  for home health PT services. Pt agreeable. Pt without preference. Referral made to several agencies [place], [place], [place], [place], [place]) all declined 2/2 service area or staffing. [facility] accepted, SOC within 48 hrs. ?Pt without DME needs. ?Post hospital f/u noted on AVS. ?Pt without Rx med concerns. ?Wife or friend to provide transportation to home. ? ?04/06/2021 @ 2:54 pm NCM received consult for home CPM device. Referral made with [facility] [place] @ [facility]. Equipment will be delivered to pt home within 48 hrs. NCM made pt aware. ? ? ? ?RNCM will sign off for now as intervention is no longer needed. Please consult [place] again if new needs arise.  ? ? ?Final next level of care: Home w Home Health Services ?Barriers to Discharge: No Barriers Identified ? ? ?Patient Goals and CMS Choice ?  ?  ?Choice offered to / list presented to : Patient ? ?Discharge Placement ?  ?           ?  ?  ?  ?  ? ?Discharge Plan and Services ?  ?  ?           ?  ?  ?  ?  ?  ?HH Arranged: PT ?HH Agency: [facility] ?Date HH Agency Contacted: 04/06/21 ?Time HH Agency Contacted: 1146 ?Representative spoke with at [facility] Agency: Misty Stanley ? ?Social Determinants of Health (SDOH) Interventions ?  ? ? ?Readmission Risk Interventions ?No flowsheet data found. ? ? ? ? ?

## 2021-04-06 NOTE — Progress Notes (Signed)
Patient taken out at this time via [facility] for wife to transport home.  ?

## 2021-04-06 NOTE — Progress Notes (Signed)
Physical Therapy Treatment ?Patient Details ?Name: Brent Nguyen ?MRN: [id number] ?DOB: 1965/02/21 ?Today's Date: 04/06/2021 ? ? 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 PMH includes HTN, tobacco use, back sx, multiple orthopedic injuries and procedures. ?  ?PT Comments  ? ? Pt progressing well with mobility; mod indep with transfers using RW, supervision for safety with ambulation; note improving quad activation with L knee therex. Reviewed education re: precautions, positioning, KI wear, therex, activity recommendations, fall risk reduction, importance of mobility. Pt hopeful for d/c today; if pt to remain admitted, will continue to follow acutely. ?   ?Recommendations for follow up therapy are one component of a multi-disciplinary discharge planning process, led by the attending physician.  Recommendations may be updated based on patient status, additional functional criteria and insurance authorization. ? ?Follow Up Recommendations ? Follow physician's recommendations for discharge plan and follow up therapies ?  ?  ?Assistance Recommended at Discharge Intermittent Supervision/Assistance  ?Patient can return home with the following Assistance with cooking/housework;Assist for transportation;A little help with bathing/dressing/bathroom ?  ?Equipment Recommendations ? None recommended by PT  ?  ?Recommendations for Other Services   ? ? ?  ?Precautions / Restrictions Precautions ?Precautions: Knee;Fall ?Precaution Comments: Verbally reviewed knee precautions ?Required Braces or Orthoses: Knee Immobilizer - Left ?Knee Immobilizer - Left: On except when in CPM (except when performing seated/supine exercises or in CPM per MD request) ?Restrictions ?Weight Bearing Restrictions: Yes ?LLE Weight Bearing: Weight bearing as tolerated ?Other Position/Activity Restrictions: KI on with mobility tasks per MD request  ?  ? ?Mobility ? Bed Mobility ?Overal bed mobility: Modified Independent ?Bed  Mobility: Supine to Sit ?  ?  ?  ?  ?  ?  ?  ? ?Transfers ?Overall transfer level: Modified independent ?Equipment used: Rolling walker (2 wheels) ?Transfers: Sit to/from Stand ?Sit to Stand: Modified independent (Device/Increase time) ?  ?  ?  ?  ?  ?General transfer comment: Mod indep standing from EOB with and without RW; encouraged pt to use RW to decrease fall risk ?  ? ?Ambulation/Gait ?Ambulation/Gait assistance: Supervision ?Gait Distance (Feet): 240 Feet ?Assistive device: Rolling walker (2 wheels) ?Gait Pattern/deviations: Step-through pattern, Decreased stride length, Decreased weight shift to left, Antalgic ?Gait velocity: Decreased ?  ?  ?General Gait Details: Slow, antalgic gait with RW and supervision for safety due to fall risk; knee immobilizer donned for ambulation, L knee ROM limited ? ? ?Stairs ?  ?  ?  ?  ?  ? ? ?Wheelchair Mobility ?  ? ?Modified Rankin (Stroke Patients Only) ?  ? ? ?  ?Balance Overall balance assessment: Needs assistance ?Sitting-balance support: No upper extremity supported ?Sitting balance-Leahy Scale: Good ?  ?  ?Standing balance support: No upper extremity supported ?Standing balance-Leahy Scale: Fair ?Standing balance comment: can static stand and take steps without UE support; static and dynamic stability improved with RW use ?  ?  ?  ?  ?  ?  ?  ?  ?  ?  ?  ?  ? ?  ?Cognition Arousal/Alertness: Awake/alert ?Behavior During Therapy: [facility] for tasks assessed/performed ?Overall Cognitive Status: Within Functional Limits for tasks assessed ?  ?  ?  ?  ?  ?  ?  ?  ?  ?  ?  ?  ?  ?  ?  ?  ?  ?  ?  ? ?  ?Exercises Total Joint Exercises ?Quad Sets: AROM, Left,  Seated (legs reclined) ?Heel Slides: AROM, Left, Seated (wash cloth under foot to reduce friction on floor) ?Long Arc Quad: AAROM, Left, Seated (muscle activation improving with AAROM; partial range) ? ?  ?General Comments General comments (skin integrity, edema, etc.): reviewed education re: precautions, positioning, KI  wear, therex, activity recommendations. KI irritating pt's inner thigh, placed washup cloth for additional padding and pressure relief. CM reports unsure pt will get HHPT services, but OPPT may be an option; pt certainly appropriate for OP PT from a mobility perspective, pt reports he has transportation to appts; informed pt d/c recs up to physician ?  ?  ? ?Pertinent Vitals/Pain Pain Assessment ?Pain Assessment: Faces ?Faces Pain Scale: Hurts little more ?Pain Location: L knee ?Pain Descriptors / Indicators: Grimacing, Guarding ?Pain Intervention(s): Monitored during session, Limited activity within patient's tolerance  ? ? ?Home Living   ?  ?  ?  ?  ?  ?  ?  ?  ?  ?   ?  ?Prior Function    ?  ?  ?   ? ?PT Goals (current goals can now be found in the care plan section) Progress towards PT goals: Progressing toward goals ? ?  ?Frequency ? ? ? 7X/week ? ? ? ?  ?PT Plan Current plan remains appropriate  ? ? ?Co-evaluation   ?  ?  ?  ?  ? ?  ?AM-PAC PT "6 Clicks" Mobility   ?Outcome Measure ? Help needed turning from your back to your side while in a flat bed without using bedrails?: None ?Help needed moving from lying on your back to sitting on the side of a flat bed without using bedrails?: None ?Help needed moving to and from a bed to a chair (including a wheelchair)?: None ?Help needed standing up from a chair using your arms (e.g., wheelchair or bedside chair)?: None ?Help needed to walk in hospital room?: A Little ?Help needed climbing 3-5 steps with a railing? : A Little ?6 Click Score: 22 ? ?  ?End of Session Equipment Utilized During Treatment: Gait belt;Left knee immobilizer ?Activity Tolerance: Patient tolerated treatment well ?Patient left: in chair;with call bell/phone within reach ?Nurse Communication: Mobility status;Other (comment) (informed NT pt ready for washup) ?PT Visit Diagnosis: Unsteadiness on feet (R26.81);Muscle weakness (generalized) (M62.81);Difficulty in walking, not elsewhere classified  (R26.2) ?  ? ? ?Time: 1010-1037 ?PT Time Calculation (min) (ACUTE ONLY): 27 min ? ?Charges:  $Therapeutic Exercise: 23-37 mins          ?          ? ?Ina Homes, PT, DPT ?Acute Rehabilitation Services  ?Pager [phone] ?Office [phone] ? ?Malachy Chamber ?04/06/2021, 1:34 PM ? ?

## 2021-04-06 NOTE — Plan of Care (Signed)

## 2021-04-06 NOTE — Plan of Care (Signed)
?  Problem: Education: ?Goal: Knowledge of General Education information will improve ?Description: Including pain rating scale, medication(s)/side effects and non-pharmacologic comfort measures ?04/06/2021 1630 by Eloisa Northern, RN ?Outcome: Adequate for Discharge ?04/06/2021 1629 by Eloisa Northern, RN ?Outcome: Adequate for Discharge ?  ?Problem: Clinical Measurements: ?Goal: Ability to maintain clinical measurements within normal limits will improve ?04/06/2021 1630 by Eloisa Northern, RN ?Outcome: Adequate for Discharge ?04/06/2021 1629 by Eloisa Northern, RN ?Outcome: Adequate for Discharge ?Goal: Will remain free from infection ?04/06/2021 1630 by Eloisa Northern, RN ?Outcome: Adequate for Discharge ?04/06/2021 1629 by Eloisa Northern, RN ?Outcome: Adequate for Discharge ?Goal: Diagnostic test results will improve ?04/06/2021 1630 by Eloisa Northern, RN ?Outcome: Adequate for Discharge ?04/06/2021 1629 by Eloisa Northern, RN ?Outcome: Adequate for Discharge ?Goal: Respiratory complications will improve ?04/06/2021 1630 by Eloisa Northern, RN ?Outcome: Adequate for Discharge ?04/06/2021 1629 by Eloisa Northern, RN ?Outcome: Adequate for Discharge ?Goal: Cardiovascular complication will be avoided ?04/06/2021 1630 by Eloisa Northern, RN ?Outcome: Adequate for Discharge ?04/06/2021 1629 by Eloisa Northern, RN ?Outcome: Adequate for Discharge ?  ?Problem: Activity: ?Goal: Risk for activity intolerance will decrease ?04/06/2021 1630 by Eloisa Northern, RN ?Outcome: Adequate for Discharge ?04/06/2021 1629 by Eloisa Northern, RN ?Outcome: Adequate for Discharge ?  ?Problem: Nutrition: ?Goal: Adequate nutrition will be maintained ?04/06/2021 1630 by Eloisa Northern, RN ?Outcome: Adequate for Discharge ?04/06/2021 1629 by Eloisa Northern, RN ?Outcome: Adequate for Discharge ?  ?Problem: Coping: ?Goal: Level of anxiety will decrease ?04/06/2021 1630 by Eloisa Northern, RN ?Outcome: Adequate for Discharge ?04/06/2021 1629 by Eloisa Northern, RN ?Outcome:  Adequate for Discharge ?  ?Problem: Elimination: ?Goal: Will not experience complications related to bowel motility ?04/06/2021 1630 by Eloisa Northern, RN ?Outcome: Adequate for Discharge ?04/06/2021 1629 by Eloisa Northern, RN ?Outcome: Adequate for Discharge ?Goal: Will not experience complications related to urinary retention ?04/06/2021 1630 by Eloisa Northern, RN ?Outcome: Adequate for Discharge ?04/06/2021 1629 by Eloisa Northern, RN ?Outcome: Adequate for Discharge ?  ?Problem: Pain Managment: ?Goal: General experience of comfort will improve ?04/06/2021 1630 by Eloisa Northern, RN ?Outcome: Adequate for Discharge ?04/06/2021 1629 by Eloisa Northern, RN ?Outcome: Adequate for Discharge ?  ?Problem: Safety: ?Goal: Ability to remain free from injury will improve ?04/06/2021 1630 by Eloisa Northern, RN ?Outcome: Adequate for Discharge ?04/06/2021 1629 by Eloisa Northern, RN ?Outcome: Adequate for Discharge ?  ?Problem: Skin Integrity: ?Goal: Risk for impaired skin integrity will decrease ?04/06/2021 1630 by Eloisa Northern, RN ?Outcome: Adequate for Discharge ?04/06/2021 1629 by Eloisa Northern, RN ?Outcome: Adequate for Discharge ?  ?

## 2021-04-06 NOTE — Progress Notes (Addendum)
Subjective: ?Doing well this AM. Did not d/c home yesterday due to Home health PT not being arranged.    ? ?Objective: ?Vital signs in last 24 hours: ?Temp:  [97.7 ?F (36.5 ?C)-98.8 ?F (37.1 ?C)] 97.7 ?F (36.5 ?C) (03/08 2010) ?Pulse Rate:  [76-95] 95 (03/08 0836) ?Resp:  [18-19] 18 (03/08 0836) ?BP: (139-146)/(82-97) 140/82 (03/08 0836) ?SpO2:  [94 %-96 %] 94 % (03/08 0836) ? ?Intake/Output from previous day: ?03/07 0701 - 03/08 0700 ?In: 120 [P.O.:120] ?Out: 200 [Urine:200] ?Intake/Output this shift: ?No intake/output data recorded. ? ?Recent Labs  ?  04/05/21 ?0139 04/06/21 ?0613  ?HGB 14.1 13.6  ? ?Recent Labs  ?  04/05/21 ?0139 04/06/21 ?0613  ?WBC 14.3* 15.3*  ?RBC 4.62 4.60  ?HCT 40.9 40.8  ?PLT 234 233  ? ?Recent Labs  ?  04/05/21 ?0139  ?NA 134*  ?K 3.9  ?CL 102  ?CO2 25  ?BUN 13  ?CREATININE 0.89  ?GLUCOSE 129*  ?CALCIUM 8.9  ? ?No results for input(s): LABPT, INR in the last 72 hours. ? ?Exam: ?Patient drowsy.  Wound looks good.  Staples intact.  No drainage or signs of infection.   ? ? ? ?Assessment/Plan: ?D/c oxy 10-15mg  for severe pain ordered.   ?D/c dilaudid IV.   ?Plan for home today if case manager can arrange HHPT.  ?Patient was stable for discharge home last night.   ? ?Zonia Kief ?04/06/2021, 9:11 AM  ? ? ? ? ?

## 2021-04-06 NOTE — Progress Notes (Signed)
Discharge instructions (including medications) discussed with and copy provided to patient.  He verbalized understanding of instructions.  Awaiting wife's arrival.  ?

## 2021-04-06 NOTE — Progress Notes (Signed)
Physical Therapy Treatment ?Patient Details ?Name: Brent Nguyen ?MRN: [id number] ?DOB: 1965/07/07 ?Today's Date: 04/06/2021 ? ? 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 PMH includes HTN, tobacco use, back sx, multiple orthopedic injuries and procedures. ?  ?PT Comments  ? ? Pt progressing well with mobility. Session focused on compensatory strategies to perform ADL tasks in preparation for d/c home. Further educ, including fall risk reduction. Pt tolerating 0-60' in CPM. Pt preparing for d/c home this afternoon. ?   ?Recommendations for follow up therapy are one component of a multi-disciplinary discharge planning process, led by the attending physician.  Recommendations may be updated based on patient status, additional functional criteria and insurance authorization. ? ?Follow Up Recommendations ? Follow physician's recommendations for discharge plan and follow up therapies ?  ?  ?Assistance Recommended at Discharge Intermittent Supervision/Assistance  ?Patient can return home with the following Assistance with cooking/housework;Assist for transportation;A little help with bathing/dressing/bathroom ?  ?Equipment Recommendations ? None recommended by PT  ?  ?Recommendations for Other Services   ? ? ?  ?Precautions / Restrictions Precautions ?Precautions: Knee;Fall ?Precaution Comments: Verbally reviewed knee precautions ?Required Braces or Orthoses: Knee Immobilizer - Left ?Knee Immobilizer - Left: On except when in CPM (except when performing seated/supine exercises or in CPM per MD request) ?Restrictions ?Weight Bearing Restrictions: Yes ?LLE Weight Bearing: Weight bearing as tolerated ?Other Position/Activity Restrictions: KI on with mobility tasks per MD request  ?  ? ?Mobility ? Bed Mobility ?Overal bed mobility: Modified Independent ?Bed Mobility: Supine to Sit, Sit to Supine ?  ?  ?  ?  ?  ?  ?  ? ?Transfers ?Overall transfer level: Modified independent ?Equipment used:  Rolling walker (2 wheels) ?Transfers: Sit to/from Stand ?Sit to Stand: Modified independent (Device/Increase time) ?  ?  ?  ?  ?  ?General transfer comment: Mod indep standing from EOB with and without RW; encouraged pt to use RW to decrease fall risk ?  ? ?Ambulation/Gait ?Ambulation/Gait assistance: Supervision ?Gait Distance (Feet): 24 Feet ?Assistive device: Rolling walker (2 wheels) ?Gait Pattern/deviations: Step-through pattern, Decreased stride length, Decreased weight shift to left, Antalgic ?Gait velocity: Decreased ?  ?  ?General Gait Details: Slow, antalgic gait with RW and supervision for safety due to fall risk; knee immobilizer donned for ambulation, L knee ROM limited; further ambulation deferred to focus on ADL tasks in preparation for d/c home ? ? ?Stairs ?  ?  ?  ?  ?  ? ? ?Wheelchair Mobility ?  ? ?Modified Rankin (Stroke Patients Only) ?  ? ? ?  ?Balance Overall balance assessment: Needs assistance ?Sitting-balance support: No upper extremity supported ?Sitting balance-Leahy Scale: Good ?  ?  ?Standing balance support: No upper extremity supported ?Standing balance-Leahy Scale: Fair ?Standing balance comment: can static stand to pull up boxers/shorts without UE support, encouraged having walker in front to allow for UE support to stabilize as needed ?  ?  ?  ?  ?  ?  ?  ?  ?  ?  ?  ?  ? ?  ?Cognition Arousal/Alertness: Awake/alert ?Behavior During Therapy: [facility] for tasks assessed/performed ?Overall Cognitive Status: Within Functional Limits for tasks assessed ?  ?  ?  ?  ?  ?  ?  ?  ?  ?  ?  ?  ?  ?  ?  ?  ?  ?  ?  ? ?  ?Exercises Total Joint  Exercises ?Quad Sets: AROM, Left, Seated (legs reclined) ?Heel Slides: AROM, Left, Seated (wash cloth under foot to reduce friction on floor) ?Long Arc Quad: AAROM, Left, Seated (muscle activation improving with AAROM; partial range) ? ?  ?General Comments General comments (skin integrity, edema, etc.): session focused on ADL tasks in preparation for d/c  home. Pt able to toilet without assist; opting to perform LB dressing long sitting in bed, demos good hamstring flexibility for this, cues for strategy to compensate for LLE limitations, unable to bridge hips to pull clothes to waist needing to stand to complete task; encouraged performing dressing while seated to reduce fall risk. CPM donned at end of session (pt tolerating 0-60'); pt reports doctor does not want him to have CPM at home. reviewed educ; pt reports no further questions or concerns ?  ?  ? ?Pertinent Vitals/Pain Pain Assessment ?Pain Assessment: Faces ?Faces Pain Scale: Hurts little more ?Pain Location: L knee ?Pain Descriptors / Indicators: Grimacing, Guarding ?Pain Intervention(s): Monitored during session, Limited activity within patient's tolerance  ? ? ?Home Living   ?  ?  ?  ?  ?  ?  ?  ?  ?  ?   ?  ?Prior Function    ?  ?  ?   ? ?PT Goals (current goals can now be found in the care plan section) Progress towards PT goals: Progressing toward goals ? ?  ?Frequency ? ? ? 7X/week ? ? ? ?  ?PT Plan Current plan remains appropriate  ? ? ?Co-evaluation   ?  ?  ?  ?  ? ?  ?AM-PAC PT "6 Clicks" Mobility   ?Outcome Measure ? Help needed turning from your back to your side while in a flat bed without using bedrails?: None ?Help needed moving from lying on your back to sitting on the side of a flat bed without using bedrails?: None ?Help needed moving to and from a bed to a chair (including a wheelchair)?: None ?Help needed standing up from a chair using your arms (e.g., wheelchair or bedside chair)?: None ?Help needed to walk in hospital room?: A Little ?Help needed climbing 3-5 steps with a railing? : A Little ?6 Click Score: 22 ? ?  ?End of Session Equipment Utilized During Treatment: Gait belt;Left knee immobilizer ?Activity Tolerance: Patient tolerated treatment well ?Patient left: in bed;in CPM;with call bell/phone within reach ?Nurse Communication: Mobility status ?PT Visit Diagnosis: Unsteadiness  on feet (R26.81);Muscle weakness (generalized) (M62.81);Difficulty in walking, not elsewhere classified (R26.2) ?  ? ? ?Time: 1884-1660 ?PT Time Calculation (min) (ACUTE ONLY): 19 min ? ?Charges:  $Therapeutic Activity: 8-22 mins          ?          ? ?Ina Homes, PT, DPT ?Acute Rehabilitation Services  ?Pager [phone] ?Office [phone] ? ?Malachy Chamber ?04/06/2021, 3:37 PM ? ?

## 2021-04-07 ENCOUNTER — Other Ambulatory Visit: Payer: Self-pay | Admitting: Cardiovascular Disease

## 2021-04-12 NOTE — Discharge Summary (Signed)
? ?Patient ID: ?Brent Nguyen ?MRN: [id number] ?DOB/AGE: 1965/09/24 56 y.o. ? ?Admit date: 04/04/2021 ?Discharge date: 04/06/2021 ? ?Admission Diagnoses:  ?Principal Problem: ?  Arthritis of left knee ?Active Problems: ?  Unilateral primary osteoarthritis, left knee ? ? ?Discharge Diagnoses:  ?Principal Problem: ?  Arthritis of left knee ?Active Problems: ?  Unilateral primary osteoarthritis, left knee ? status post Procedure(s): ?Left TOTAL KNEE ARTHROPLASTY ? ?Past Medical History:  ?Diagnosis Date  ? Chronic back pain   ? Diverticulosis   ? a. s/p partial colectomy 2005 for anastamotic leak with reversal.  ? History of kidney stones   ? Hyperlipidemia   ? Hypertension   ? Normal cardiac stress test 2015  ? low risk  ? Obesity   ? Sciatica, left side   ? Tobacco abuse   ? ? ?Surgeries: Procedure(s): ?Left TOTAL KNEE ARTHROPLASTY on 04/04/2021 ?  ?Consultants:  ? ?Discharged Condition: Improved ? ?Hospital Course: Brent Nguyen is an 56 y.o. male who was admitted 04/04/2021 for operative treatment of Arthritis of left knee. Patient failed conservative treatments (please see the history and physical for the specifics) and had severe unremitting pain that affects sleep, daily activities and work/hobbies. After pre-op clearance, the patient was taken to the operating room on 04/04/2021 and underwent  Procedure(s): ?Left TOTAL KNEE ARTHROPLASTY.   ? ?Patient was given perioperative antibiotics:  ?Anti-infectives (From admission, onward)  ? ? Start     Dose/Rate Route Frequency Ordered Stop  ? 04/04/21 0715  vancomycin (VANCOCIN) IVPB 1000 mg/200 mL premix       ? 1,000 mg ?200 mL/hr over 60 Minutes Intravenous On call to O.R. 04/04/21 0702 04/04/21 1442  ? ?  ?  ? ?Patient was given sequential compression devices and early ambulation to prevent DVT.  ? ?Patient benefited maximally from hospital stay and there were no complications. At the time of discharge, the patient was urinating/moving their bowels without difficulty,  tolerating a regular diet, pain is controlled with oral pain medications and they have been cleared by PT/OT.  ? ?Recent vital signs: No data found.  ? ?Recent laboratory studies: No results for input(s): WBC, HGB, HCT, PLT, NA, K, CL, CO2, BUN, CREATININE, GLUCOSE, INR, CALCIUM in the last 72 hours. ? ?Invalid input(s): PT, 2 ? ? ?Discharge Medications:   ?Allergies as of 04/06/2021   ? ?   Reactions  ? Penicillins Other (See Comments)  ? Childhood reaction, caused urticaria   ? ?  ? ?  ?Medication List  ?  ? ?TAKE these medications   ? ?amLODipine 10 MG tablet ?Commonly known as: NORVASC ?Take 1 tablet by mouth daily. Appointment needed-1st attempt ?  ?aspirin 325 MG EC tablet ?Take 1 tablet (325 mg total) by mouth daily with breakfast. Must take at least 4 weeks postop for DVT prophylaxis ?  ?carvedilol 25 MG tablet ?Commonly known as: COREG ?Take 1 tablet (25 mg total) by mouth 2 (two) times daily with a meal. ?  ?methocarbamol 500 MG tablet ?Commonly known as: ROBAXIN ?Take 1 tablet (500 mg total) by mouth every 6 (six) hours as needed for muscle spasms. ?  ? ?  ? ?ASK your doctor about these medications   ? ?oxyCODONE-acetaminophen 5-325 MG tablet ?Commonly known as: PERCOCET/ROXICET ?Take 1-2 tablets by mouth every 6 (six) hours as needed for severe pain. ?Ask about: Which instructions should I use? ?  ?oxyCODONE-acetaminophen 5-325 MG tablet ?Commonly known as: PERCOCET/ROXICET ?Take 1-2 tablets by mouth every 6 (  six) hours as needed for severe pain. ?Ask about: Which instructions should I use? ?  ? ?  ? ? ?Diagnostic Studies: DG Knee 1-2 Views Left ? ?Result Date: 04/04/2021 ?CLINICAL DATA:  Left knee replacement. EXAM: LEFT KNEE - 1-2 VIEW COMPARISON:  None. FINDINGS: The knee demonstrates a total knee arthroplasty without evidence of hardware failure or complication. There is expected intra-articular air. There is no fracture or dislocation. The alignment is anatomic. Post-surgical changes noted in the  surrounding soft tissues. IMPRESSION: 1. Left total knee arthroplasty without evidence of hardware complication. Electronically Signed   By: Obie Dredge M.D.   On: 04/04/2021 13:10   ? ? ? ? Follow-up Information   ? ? [name], MD. Schedule an appointment as soon as possible for a visit today.   ?Specialty: Orthopedic Surgery ?Why: Need return office visit with Dr. Ophelia Charter 1 week postop ?Contact information: ?[address] ?[place] [address] ?[phone] ? ? ?  ?  ? ? [facility] Follow up.   ?Specialty: Home Health Services ?Why: [facility] will provide you with your home health PT services, start of care within 48 hours post discharge. ?Contact information: ?[address] ?[address] ?[place] [address] ?[phone] ? ? ?  ?  ? ?  ?  ? ?  ? ? ?Discharge Plan:  discharge to home ? ?Disposition:  ? ? ? ?Signed: ?Zonia Kief  ?04/12/2021, 2:16 PM ? ? ? ?  ?

## 2021-04-13 ENCOUNTER — Encounter: Payer: 59 | Admitting: Surgery

## 2021-04-14 ENCOUNTER — Ambulatory Visit: Payer: Self-pay

## 2021-04-14 ENCOUNTER — Ambulatory Visit (INDEPENDENT_AMBULATORY_CARE_PROVIDER_SITE_OTHER): Payer: 59 | Admitting: Orthopaedic Surgery

## 2021-04-14 ENCOUNTER — Other Ambulatory Visit: Payer: Self-pay

## 2021-04-14 DIAGNOSIS — Z96652 Presence of left artificial knee joint: Secondary | ICD-10-CM

## 2021-04-14 NOTE — Progress Notes (Signed)
? ?Post-Op Visit Note ?  ?Patient: Brent Nguyen           ?Date of Birth: 20-Jan-1966           ?MRN: [id number] ?Visit Date: 04/14/2021 ?PCP: Pearson Grippe, MD ? ? ?Assessment & Plan post left total knee arthroplasty.  Return 1 week for staple removal.  Transition outpatient therapy. ? ?Chief Complaint:  ?Chief Complaint  ?Patient presents with  ? Left Knee - Routine Post Op  ?  S/p left TKA  ? ?Visit Diagnoses:  ?1. Status post total left knee replacement   ? ? ?Plan: Return 1 week for staple removal. ? ?Follow-Up Instructions: Return in about 1 week (around 04/21/2021).  ? ?Orders:  ?Orders Placed This Encounter  ?Procedures  ? XR Knee 1-2 Views Left  ? ?No orders of the defined types were placed in this encounter. ? ? ?Imaging: ?No results found. ? ?PMFS History: ?Patient Active Problem List  ? Diagnosis Date Noted  ? Arthritis of left knee 04/04/2021  ? Unilateral primary osteoarthritis, left knee 12/17/2020  ? Chest pain 05/17/2013  ? Palpitations 05/17/2013  ? Hyperlipidemia 04/25/2013  ? Diverticulosis s/p partial colectomy with reversal   ? HTN (hypertension), severe 09/17/2012  ? Tobacco abuse 09/17/2012  ? Obesity (BMI 30-39.9) 09/17/2012  ? ?Past Medical History:  ?Diagnosis Date  ? Chronic back pain   ? Diverticulosis   ? a. s/p partial colectomy 2005 for anastamotic leak with reversal.  ? History of kidney stones   ? Hyperlipidemia   ? Hypertension   ? Normal cardiac stress test 2015  ? low risk  ? Obesity   ? Sciatica, left side   ? Tobacco abuse   ?  ?Family History  ?Problem Relation Age of Onset  ? Hypertension Mother   ? Diverticulosis Mother   ? Sleep apnea Mother   ? Celiac disease Mother   ? Ulcers Mother   ? Cancer Father   ?     Brain  ? Diverticulosis Brother   ? Ulcers Brother   ? Celiac disease Brother   ? Hypotension Brother   ? Cancer Maternal Grandmother   ?     Breast  ? Ulcers Maternal Grandfather   ? ALS Maternal Grandfather   ? Diabetes Maternal Grandfather   ? Heart attack Paternal  Grandmother   ? Hypertension Paternal Grandmother   ? Cancer Paternal Grandfather   ?     Melanoma  ? Diverticulosis Daughter   ?  ?Past Surgical History:  ?Procedure Laterality Date  ? BACK SURGERY  2021  ? "two-level instrumented fusion without interbody cages" in [place]  ? COLOSTOMY REVERSAL    ? KNEE ARTHROSCOPY Right   ? KNEE ARTHROSCOPY Right 01/20/2013  ? Procedure: ARTHROSCOPY KNEE;  Surgeon: Darreld Mclean, MD;  Location: [facility];  Service: Orthopedics;  Laterality: Right;  ? MENISECTOMY Right 01/20/2013  ? Procedure: partial medial menisectomy;  Surgeon: Darreld Mclean, MD;  Location: [facility];  Service: Orthopedics;  Laterality: Right;  ? PARTIAL COLECTOMY    ? 2 subsequent surgeries to reverse his colostomy-diverticulosis  ? TOTAL KNEE ARTHROPLASTY Left 04/04/2021  ? Procedure: Left TOTAL KNEE ARTHROPLASTY;  Surgeon: [name], MD;  Location: [facility] OR;  Service: Orthopedics;  Laterality: Left;  ? WISDOM TOOTH EXTRACTION    ? ?Social History  ? ?Occupational History  ? Not on file  ?Tobacco Use  ? Smoking status: Every Day  ?  Packs/day: 1.00  ?  Years: 25.00  ?  Pack years: 25.00  ?  Types: Cigarettes  ? Smokeless tobacco: Former  ?Vaping Use  ? Vaping Use: Some days  ? Substances: THC  ?Substance and Sexual Activity  ? Alcohol use: Yes  ?  Comment: On occasion, 1 or 2 beers per month  ? Drug use: No  ? Sexual activity: Yes  ?  Birth control/protection: None  ? ? ? ?

## 2021-04-17 ENCOUNTER — Other Ambulatory Visit: Payer: Self-pay | Admitting: Surgery

## 2021-04-21 ENCOUNTER — Encounter: Payer: Self-pay | Admitting: Orthopaedic Surgery

## 2021-04-21 ENCOUNTER — Ambulatory Visit (INDEPENDENT_AMBULATORY_CARE_PROVIDER_SITE_OTHER): Payer: 59 | Admitting: Orthopaedic Surgery

## 2021-04-21 ENCOUNTER — Other Ambulatory Visit: Payer: Self-pay

## 2021-04-21 VITALS — Ht 73.0 in | Wt 230.0 lb

## 2021-04-21 DIAGNOSIS — Z96652 Presence of left artificial knee joint: Secondary | ICD-10-CM

## 2021-04-21 DIAGNOSIS — M1712 Unilateral primary osteoarthritis, left knee: Secondary | ICD-10-CM

## 2021-04-21 NOTE — Progress Notes (Signed)
? ?Post-Op Visit Note ?  ?Patient: Brent Nguyen           ?Date of Birth: [birth year]           ?MRN: [id number] ?Visit Date: 04/21/2021 ?PCP: Pearson Grippe, MD ? ? ?Assessment & Plan: Post left total knee arthroplasty.  Staples harvested today.  Range of motion is 5 to 110 degrees.  Transition outpatient therapy.  Recheck 5 weeks. ? ?Chief Complaint:  ?Chief Complaint  ?Patient presents with  ? Left Knee - Follow-up  ?  04/04/2021 left TKA  ? ?Visit Diagnoses:  ?1. Arthritis of left knee   ?2. Unilateral primary osteoarthritis, left knee   ?3. S/P total knee arthroplasty, left   ? ? ?Plan: Continue post knee rehab.  He is happy the surgical result.  He states he stopped taking his Percocet pain medication. ? ?Follow-Up Instructions: Return in about 5 weeks (around 05/26/2021).  ? ?Orders:  ?No orders of the defined types were placed in this encounter. ? ?No orders of the defined types were placed in this encounter. ? ? ?Imaging: ?No results found. ? ?PMFS History: ?Patient Active Problem List  ? Diagnosis Date Noted  ? S/P total knee arthroplasty, left 04/21/2021  ? Arthritis of left knee 04/04/2021  ? Unilateral primary osteoarthritis, left knee 12/17/2020  ? Chest pain 05/17/2013  ? Palpitations 05/17/2013  ? Hyperlipidemia 04/25/2013  ? Diverticulosis s/p partial colectomy with reversal   ? HTN (hypertension), severe 09/17/2012  ? Tobacco abuse 09/17/2012  ? Obesity (BMI 30-39.9) 09/17/2012  ? ?Past Medical History:  ?Diagnosis Date  ? Chronic back pain   ? Diverticulosis   ? a. s/p partial colectomy 2005 for anastamotic leak with reversal.  ? History of kidney stones   ? Hyperlipidemia   ? Hypertension   ? Normal cardiac stress test 2015  ? low risk  ? Obesity   ? Sciatica, left side   ? Tobacco abuse   ?  ?Family History  ?Problem Relation Age of Onset  ? Hypertension Mother   ? Diverticulosis Mother   ? Sleep apnea Mother   ? Celiac disease Mother   ? Ulcers Mother   ? Cancer Father   ?     Brain  ? Diverticulosis  Brother   ? Ulcers Brother   ? Celiac disease Brother   ? Hypotension Brother   ? Cancer Maternal Grandmother   ?     Breast  ? Ulcers Maternal Grandfather   ? ALS Maternal Grandfather   ? Diabetes Maternal Grandfather   ? Heart attack Paternal Grandmother   ? Hypertension Paternal Grandmother   ? Cancer Paternal Grandfather   ?     Melanoma  ? Diverticulosis Daughter   ?  ?Past Surgical History:  ?Procedure Laterality Date  ? BACK SURGERY  2021  ? "two-level instrumented fusion without interbody cages" in [place]  ? COLOSTOMY REVERSAL    ? KNEE ARTHROSCOPY Right   ? KNEE ARTHROSCOPY Right 01/20/2013  ? Procedure: ARTHROSCOPY KNEE;  Surgeon: Darreld Mclean, MD;  Location: [facility];  Service: Orthopedics;  Laterality: Right;  ? MENISECTOMY Right 01/20/2013  ? Procedure: partial medial menisectomy;  Surgeon: Darreld Mclean, MD;  Location: [facility];  Service: Orthopedics;  Laterality: Right;  ? PARTIAL COLECTOMY    ? 2 subsequent surgeries to reverse his colostomy-diverticulosis  ? TOTAL KNEE ARTHROPLASTY Left 04/04/2021  ? Procedure: Left TOTAL KNEE ARTHROPLASTY;  Surgeon: [name], MD;  Location: [facility];  Service: Orthopedics;  Laterality: Left;  ? WISDOM TOOTH EXTRACTION    ? ?Social History  ? ?Occupational History  ? Not on file  ?Tobacco Use  ? Smoking status: Every Day  ?  Packs/day: 1.00  ?  Years: 25.00  ?  Pack years: 25.00  ?  Types: Cigarettes  ? Smokeless tobacco: Former  ?Vaping Use  ? Vaping Use: Some days  ? Substances: THC  ?Substance and Sexual Activity  ? Alcohol use: Yes  ?  Comment: On occasion, 1 or 2 beers per month  ? Drug use: No  ? Sexual activity: Yes  ?  Birth control/protection: None  ? ? ? ?

## 2021-04-28 ENCOUNTER — Other Ambulatory Visit: Payer: Self-pay | Admitting: Cardiovascular Disease

## 2021-04-28 ENCOUNTER — Other Ambulatory Visit: Payer: Self-pay | Admitting: Surgery

## 2021-05-26 ENCOUNTER — Ambulatory Visit (INDEPENDENT_AMBULATORY_CARE_PROVIDER_SITE_OTHER): Payer: 59 | Admitting: Orthopaedic Surgery

## 2021-05-26 ENCOUNTER — Encounter: Payer: Self-pay | Admitting: Orthopaedic Surgery

## 2021-05-26 VITALS — Ht 73.0 in | Wt 227.0 lb

## 2021-05-26 DIAGNOSIS — Z96652 Presence of left artificial knee joint: Secondary | ICD-10-CM

## 2021-05-26 NOTE — Progress Notes (Signed)
? ?Post-Op Visit Note ?  ?Patient: Brent Nguyen           ?Date of Birth: Apr 19, 1965           ?MRN: [id number] ?Visit Date: 05/26/2021 ?PCP: Pearson Grippe, MD ? ? ?Assessment & Plan 56 year old male  6 or 7 weeks post total knee arthroplasty.  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  He states he was fearful that he done something to damage it noticed maybe some mild swelling he noticed the pain anterolateral adjacent to the kneecap.  Quad tendon and patellar tendon lateral retinaculum is intact.  He does have mild swelling.  He also noticed lymph node palpable in his groin.  He has flexion past 110 degrees easily has full extension quad strength is improving.  Continue and finish out therapy he can then transition to the gym.  Lateral ligaments are stable. ? ?Chief Complaint:  ?Chief Complaint  ?Patient presents with  ? Left Knee - Follow-up, Pain  ? ?Visit Diagnoses:  ?1. S/P total knee arthroplasty, left   ? ? ?Plan: Patient is got great motion and strength significantly improved.  He will progress to the gym.  Patient is on disability not working and has been working on riding the bike.  I will recheck him for final visit in 2 months. ? ?Follow-Up Instructions: Return in about 2 months (around 07/26/2021).  ? ?Orders:  ?No orders of the defined types were placed in this encounter. ? ?No orders of the defined types were placed in this encounter. ? ? ?Imaging: ?No results found. ? ?PMFS History: ?Patient Active Problem List  ? Diagnosis Date Noted  ? S/P total knee arthroplasty, left 04/21/2021  ? Chest pain 05/17/2013  ? Palpitations 05/17/2013  ? Hyperlipidemia 04/25/2013  ? Diverticulosis s/p partial colectomy with reversal   ? HTN (hypertension), severe 09/17/2012  ? Tobacco abuse 09/17/2012  ? Obesity (BMI 30-39.9) 09/17/2012  ? ?Past Medical History:  ?Diagnosis Date  ? Chronic back pain   ?  Diverticulosis   ? a. s/p partial colectomy 2005 for anastamotic leak with reversal.  ? History of kidney stones   ? Hyperlipidemia   ? Hypertension   ? Normal cardiac stress test 2015  ? low risk  ? Obesity   ? Sciatica, left side   ? Tobacco abuse   ?  ?Family History  ?Problem Relation Age of Onset  ? Hypertension Mother   ? Diverticulosis Mother   ? Sleep apnea Mother   ? Celiac disease Mother   ? Ulcers Mother   ? Cancer Father   ?     Brain  ? Diverticulosis Brother   ? Ulcers Brother   ? Celiac disease Brother   ? Hypotension Brother   ? Cancer Maternal Grandmother   ?     Breast  ? Ulcers Maternal Grandfather   ? ALS Maternal Grandfather   ? Diabetes Maternal Grandfather   ? Heart attack Paternal Grandmother   ? Hypertension Paternal Grandmother   ? Cancer Paternal Grandfather   ?     Melanoma  ? Diverticulosis Daughter   ?  ?Past Surgical History:  ?Procedure Laterality Date  ? BACK SURGERY  2021  ? "two-level instrumented fusion without interbody cages" in [place]  ? COLOSTOMY REVERSAL    ? KNEE ARTHROSCOPY Right   ? KNEE ARTHROSCOPY  Right 01/20/2013  ? Procedure: ARTHROSCOPY KNEE;  Surgeon: Darreld Mclean, MD;  Location: [facility];  Service: Orthopedics;  Laterality: Right;  ? MENISECTOMY Right 01/20/2013  ? Procedure: partial medial menisectomy;  Surgeon: Darreld Mclean, MD;  Location: [facility];  Service: Orthopedics;  Laterality: Right;  ? PARTIAL COLECTOMY    ? 2 subsequent surgeries to reverse his colostomy-diverticulosis  ? TOTAL KNEE ARTHROPLASTY Left 04/04/2021  ? Procedure: Left TOTAL KNEE ARTHROPLASTY;  Surgeon: [name], MD;  Location: [facility] OR;  Service: Orthopedics;  Laterality: Left;  ? WISDOM TOOTH EXTRACTION    ? ?Social History  ? ?Occupational History  ? Not on file  ?Tobacco Use  ? Smoking status: Every Day  ?  Packs/day: 1.00  ?  Years: 25.00  ?  Pack years: 25.00  ?  Types: Cigarettes  ? Smokeless tobacco: Former  ?Vaping Use  ? Vaping Use: Some days  ? Substances: THC  ?Substance and Sexual  Activity  ? Alcohol use: Yes  ?  Comment: On occasion, 1 or 2 beers per month  ? Drug use: No  ? Sexual activity: Yes  ?  Birth control/protection: None  ? ? ? ?

## 2021-07-12 ENCOUNTER — Other Ambulatory Visit: Payer: Self-pay | Admitting: *Deleted

## 2021-07-12 ENCOUNTER — Telehealth: Payer: Self-pay | Admitting: Cardiovascular Disease

## 2021-07-12 MED ORDER — AMLODIPINE BESYLATE 10 MG PO TABS: 10.0000 mg | ORAL_TABLET | Freq: Every day | ORAL | 0 refills | Status: DC

## 2021-07-12 NOTE — Telephone Encounter (Signed)
Spoke with patient - appointment was for himself, not his mother.  Patient last seen 10/08/2019 by Dr. Sallyanne Kuster in [place] office.  States he would like to make the switch because he doesn't want to drive to [place] anymore.  Appointment scheduled with Estella Husk, PA for Tuesday, 07/19/21.

## 2021-07-12 NOTE — Telephone Encounter (Signed)
Detailed voice message left asking for him to let us know mothers name & DOB - he can reply via mychart or call office back.

## 2021-07-12 NOTE — Telephone Encounter (Signed)
*  STAT* If patient is at the pharmacy, call can be transferred to refill team.   1. Which medications need to be refilled? (please list name of each medication and dose if known) amLODipine (NORVASC) 10 MG tablet  2. Which pharmacy/location (including street and city if local pharmacy) is medication to be sent to? [id number] - [place], [place] - [place]  3. Do they need a 30 day or 90 day supply? 15 day supply   Prescription sent did not advise how patient is to take medication do they are unable to distribute to the patient.    Patient has not scheduled needed appt due to being in the process of a provider switch to the [place] office.

## 2021-07-12 NOTE — Telephone Encounter (Signed)
Patient called stating his mother is a patient at the [facility] office, and was advised by a nurse at the [facility] location when he took his mother to her last appt he could switch to that location. Patient is requesting the switch due to this office being closer for him. Unsure of which provider to submit switch request to. Patient was unsure of nurses name who he spoke with. Please advise.

## 2021-07-12 NOTE — Progress Notes (Signed)
Cardiology Office Note    Date:  07/19/2021   ID:  Jahmal Dunavant Yo, DOB 1965/03/21, MRN [id number]   PCP:  Pearson Grippe, MD   [facility]  Cardiologist:  Thurmon Fair, MD   Advanced Practice Provider:  No care team member to display Electrophysiologist:  None   [phone]   Chief Complaint  Patient presents with   Follow-up    History of Present Illness:  Kierre Hintz Rea is a 56 y.o. male with history of HTN, HLD, smoker, obestiy.   Last saw Dr. Royann Shivers 10/09/2019 and having trouble with back pain.  Patient can no longer travel to [place] for appt so will be followed in [place].  Patient comes in for f/u. Had left knee replacement in March and Rodriges having a lot of trouble with it. BP up today and he thinks it's because of knee and back pain. BP 124/90's at home. He eats out a lot mostly fried foods. Smoking 1 ppd. Quit drinking daliy.    Past Medical History:  Diagnosis Date   Chronic back pain    Diverticulosis    a. s/p partial colectomy 2005 for anastamotic leak with reversal.   History of kidney stones    Hyperlipidemia    Hypertension    Normal cardiac stress test 2015   low risk   Obesity    Sciatica, left side    Tobacco abuse     Past Surgical History:  Procedure Laterality Date   BACK SURGERY  2021   "two-level instrumented fusion without interbody cages" in [place]   COLOSTOMY REVERSAL     KNEE ARTHROSCOPY Right    KNEE ARTHROSCOPY Right 01/20/2013   Procedure: ARTHROSCOPY KNEE;  Surgeon: Darreld Mclean, MD;  Location: [facility];  Service: Orthopedics;  Laterality: Right;   MENISECTOMY Right 01/20/2013   Procedure: partial medial menisectomy;  Surgeon: Darreld Mclean, MD;  Location: [facility];  Service: Orthopedics;  Laterality: Right;   PARTIAL COLECTOMY     2 subsequent surgeries to reverse his colostomy-diverticulosis   TOTAL KNEE ARTHROPLASTY Left 04/04/2021   Procedure: Left TOTAL KNEE ARTHROPLASTY;  Surgeon: [name], MD;  Location: [facility];  Service: Orthopedics;  Laterality: Left;   WISDOM TOOTH EXTRACTION      Current Medications: Current Meds  Medication Sig   amLODipine (NORVASC) 10 MG tablet Take 1 tablet (10 mg total) by mouth daily. Please schedule appt for future refills. 2nd attempt   carvedilol (COREG) 25 MG tablet Take 1 tablet (25 mg total) by mouth 2 (two) times daily with a meal.   clindamycin (CLEOCIN) 300 MG capsule SMARTSIG:2 Capsule(s) By Mouth   losartan (COZAAR) 25 MG tablet Take 1 tablet (25 mg total) by mouth daily.     Allergies:   Penicillins   Social History   Socioeconomic History   Marital status: Married    Spouse name: Not on file   Number of children: Not on file   Years of education: Not on file   Highest education level: Not on file  Occupational History   Not on file  Tobacco Use   Smoking status: Every Day    Packs/day: 1.00    Years: 25.00    Total pack years: 25.00    Types: Cigarettes   Smokeless tobacco: Former  Building services engineer Use: Some days   Substances: THC  Substance and Sexual Activity   Alcohol use: Yes    Comment: On occasion, 1 or  2 beers per month   Drug use: No   Sexual activity: Yes    Birth control/protection: None  Other Topics Concern   Not on file  Social History Narrative   Not on file   Social Determinants of Health   Financial Resource Strain: Low Risk  (02/20/2020)   Overall Financial Resource Strain (CARDIA)    Difficulty of Paying Living Expenses: Not hard at all  Food Insecurity: No Food Insecurity (02/20/2020)   Hunger Vital Sign    Worried About Running Out of Food in the Last Year: Never true    Ran Out of Food in the Last Year: Never true  Transportation Needs: No Transportation Needs (02/20/2020)   PRAPARE - Administrator, Civil Service (Medical): No    Lack of Transportation (Non-Medical): No  Physical Activity: Sufficiently Active (02/20/2020)   Exercise Vital Sign    Days of Exercise per  Week: 3 days    Minutes of Exercise per Session: 60 min  Stress: No Stress Concern Present (02/20/2020)   [employer] of Occupational Health - Occupational Stress Questionnaire    Feeling of Stress : Not at all  Social Connections: Moderately Isolated (02/20/2020)   Social Connection and Isolation Panel [NHANES]    Frequency of Communication with Friends and Family: More than three times a week    Frequency of Social Gatherings with Friends and Family: More than three times a week    Attends Religious Services: Never    Database administrator or Organizations: No    Attends Engineer, structural: Never    Marital Status: Married     Family History:  The patient's  family history includes ALS in his maternal grandfather; Cancer in his father, maternal grandmother, and paternal grandfather; Celiac disease in his brother and mother; Diabetes in his maternal grandfather; Diverticulosis in his brother, daughter, and mother; Heart attack in his paternal grandmother; Hypertension in his mother and paternal grandmother; Hypotension in his brother; Sleep apnea in his mother; Ulcers in his brother, maternal grandfather, and mother.   ROS:   Please see the history of present illness.    ROS All other systems reviewed and are negative.   PHYSICAL EXAM:   VS:  BP (!) 142/98 (BP Location: Left Arm, Patient Position: Sitting, Cuff Size: Large)   Pulse 80   Ht  (1.854 m)   Wt 222 lb (100.7 kg)   SpO2 98%   BMI 29.29 kg/m   Physical Exam  GEN: Well nourished, well developed, in no acute distress  Neck: no JVD, carotid bruits, or masses Cardiac:RRR; no murmurs, rubs, or gallops  Respiratory:  decreased breath sounds but clear to auscultation bilaterally, normal work of breathing GI: soft, nontender, nondistended, + BS Ext: without cyanosis, clubbing, or edema, Good distal pulses bilaterally Neuro:  Alert and Oriented x 3,  Psych: euthymic mood, full affect  Wt Readings from  Last 3 Encounters:  07/19/21 222 lb (100.7 kg)  05/26/21 227 lb (103 kg)  04/21/21 230 lb (104.3 kg)      Studies/Labs Reviewed:   EKG:  EKG is not ordered today.     Recent Labs: 03/31/2021: ALT 20 04/05/2021: BUN 13; Creatinine, Ser 0.89; Potassium 3.9; Sodium 134 04/06/2021: Hemoglobin 13.6; Platelets 233   Lipid Panel    Component Value Date/Time   CHOL 174 10/08/2019 1512   TRIG 74 10/08/2019 1512   HDL 43 10/08/2019 1512   CHOLHDL 4.0 10/08/2019 1512  LDLCALC 117 (H) 10/08/2019 1512    Additional studies/ records that were reviewed today include:    Echo 2018 Study Conclusions   - Left ventricle: The cavity size was normal. Wall thickness was    increased in a pattern of moderate LVH. Systolic function was    normal. The estimated ejection fraction was in the range of 60%    to 65%. Wall motion was normal; there were no regional wall    motion abnormalities. Doppler parameters are consistent with    abnormal left ventricular relaxation (grade 1 diastolic    dysfunction). The E/e&' ratio is between 8-15, suggesting    indeterminate LV filling pressure.  - Aorta: Dilated aortic root and ascending aorta. Aortic root    dimension: 41 mm (ED). Ascending aortic diameter: 40 mm (S).  - Mitral valve: Mildly thickened leaflets . There was trivial    regurgitation.  - Left atrium: The atrium was normal in size.  - Inferior vena cava: The vessel was normal in size. The    respirophasic diameter changes were in the normal range (>= 50%),    consistent with normal central venous pressure.   Impressions:   - LVEF 60-65, moderate LVH, normal wall motion, grade 1 DD with    indeterminate LV filling pressure, dilated aortic root and    ascending aorta, trivial MR, normal LA size, normal IVC.   -------------------------------------------------------------------  Study data:   Study status:  Routine.  Procedure:  The patient  reported no pain pre or post test. Transthoracic  echocardiography.  Image quality was adequate.  Study completion:  There were no  complications.          Transthoracic echocardiography.  M-mode,  complete 2D, spectral Doppler, and color Doppler.  Birthdate:  Patient birthdate: Sep 18, 1965.  Age:  Patient is 56 yr old.  Sex:  Gender: male.    BMI: 32.1 kg/m^2.  Blood pressure:     140/88  Patient status:  Outpatient.  Study date:  Study date: 05/11/2016.  Study time: 03:50 PM.  Location:  [place] [facility]   -------------------------------------------------------------------   -------------------------------------------------------------------  Left ventricle:  The cavity size was normal. Wall thickness was  increased in a pattern of moderate LVH. Systolic function was  normal. The estimated ejection fraction was in the range of 60% to  65%. Wall motion was normal; there were no regional wall motion  abnormalities. Doppler parameters are consistent with abnormal left  ventricular relaxation (grade 1 diastolic dysfunction). The E/e&'  ratio is between 8-15, suggesting indeterminate LV filling  pressure.   -------------------------------------------------------------------  Aortic valve:   Structurally normal valve. Trileaflet. Cusp  separation was normal.  Doppler:  Transvalvular velocity was within  the normal range. There was no stenosis. There was no  regurgitation.   -------------------------------------------------------------------  Aorta: Dilated aortic root and ascending aorta.   -------------------------------------------------------------------  Mitral valve:   Mildly thickened leaflets .  Doppler:  There was  trivial regurgitation.    Peak gradient (D): 2 mm Hg.   -------------------------------------------------------------------  Left atrium:  The atrium was normal in size.   -------------------------------------------------------------------  Atrial septum:  No defect or patent foramen ovale was identified.     -------------------------------------------------------------------  Right ventricle:  The cavity size was normal. Wall thickness was  normal. Systolic function was normal.   -------------------------------------------------------------------  Pulmonic valve:    The valve appears to be grossly normal.  Doppler:  There was no significant regurgitation.   -------------------------------------------------------------------  Tricuspid valve:  Doppler:  There was no significant  regurgitation.   -------------------------------------------------------------------  Pulmonary artery:   The main pulmonary artery was normal-sized.   -------------------------------------------------------------------  Right atrium:  The atrium was normal in size.   -------------------------------------------------------------------  Pericardium: There was no pericardial effusion.   -------------------------------------------------------------------  Systemic veins:  Inferior vena cava: The vessel was normal in size. The  respirophasic diameter changes were in the normal range (>= 50%),  consistent with normal central venous pressure.   -------------------------------------------------------------------  Post procedure conclusions  Ascending Aorta:   - Dilated aortic root and ascending aorta.   -------------------------------------------------------------------  Measurements   Risk Assessment/Calculations:         ASSESSMENT:    1. Essential hypertension   2. Hyperlipidemia, unspecified hyperlipidemia type   3. Smoking      PLAN:  In order of problems listed above:  HTN-BP remains elevated. Eats out daily. 2 gm sodium diet. Add losartan 25 mg daily. Bmet stable in march.repeat bmet in 2 weeks  HLD-needs FLP not on meds  Tobacco abuse-smoking cessation discussed. Has smoked 39 yrs. Check screening CT.  Shared Decision Making/Informed Consent        Medication Adjustments/Labs and  Tests Ordered: Current medicines are reviewed at length with the patient today.  Concerns regarding medicines are outlined above.  Medication changes, Labs and Tests ordered today are listed in the Patient Instructions below. Patient Instructions  Medication Instructions:  Your physician has recommended you make the following change in your medication:  Start Losartan 25 mg tablets daily   Labwork: In 2 Weeks: BMET Lipid- Fasting (Nothing to eat/drink 8 hours prior to lab work)  Testing/Procedures: Chest CT  Follow-Up: Follow up in 6 months to establish with new cardiologist.   Any Other Special Instructions Will Be Listed Below (If Applicable).  Managing the Challenge of Quitting Smoking Quitting smoking is a physical and mental challenge. You may have cravings, withdrawal symptoms, and temptation to smoke. Before quitting, work with your health care provider to make a plan that can help you manage quitting. Making a plan before you quit may keep you from smoking when you have the urge to smoke while trying to quit. How to manage lifestyle changes Managing stress Stress can make you want to smoke, and wanting to smoke may cause stress. It is important to find ways to manage your stress. You could try some of the following: Practice relaxation techniques. Breathe slowly and deeply, in through your nose and out through your mouth. Listen to music. Soak in a bath or take a shower. Imagine a peaceful place or vacation. Get some support. Talk with family or friends about your stress. Join a support group. Talk with a counselor or therapist. Get some physical activity. Go for a walk, run, or bike ride. Play a favorite sport. Practice yoga.  Medicines Talk with your health care provider about medicines that might help you deal with cravings and make quitting easier for you. Relationships Social situations can be difficult when you are quitting smoking. To manage this, you  can: Avoid parties and other social situations where people might be smoking. Avoid alcohol. Leave right away if you have the urge to smoke. Explain to your family and friends that you are quitting smoking. Ask for support and let them know you might be a bit grumpy. Plan activities where smoking is not an option. General instructions Be aware that many people gain weight after they quit smoking. However, not everyone does. To keep from  gaining weight, have a plan in place before you quit, and stick to the plan after you quit. Your plan should include: Eating healthy snacks. When you have a craving, it may help to: Eat popcorn, or try carrots, celery, or other cut vegetables. Chew sugar-free gum. Changing how you eat. Eat small portion sizes at meals. Eat 4-6 small meals throughout the day instead of 1-2 large meals a day. Be mindful when you eat. You should avoid watching television or doing other things that might distract you as you eat. Exercising regularly. Make time to exercise each day. If you do not have time for a long workout, do short bouts of exercise for 5-10 minutes several times a day. Do some form of strengthening exercise, such as weight lifting. Do some exercise that gets your heart beating and causes you to breathe deeply, such as walking fast, running, swimming, or biking. This is very important. Drinking plenty of water or other low-calorie or no-calorie drinks. Drink enough fluid to keep your urine pale yellow.  How to recognize withdrawal symptoms Your body and mind may experience discomfort as you try to get used to not having nicotine in your system. These effects are called withdrawal symptoms. They may include: Feeling hungrier than normal. Having trouble concentrating. Feeling irritable or restless. Having trouble sleeping. Feeling depressed. Craving a cigarette. These symptoms may surprise you, but they are normal to have when quitting smoking. To manage  withdrawal symptoms: Avoid places, people, and activities that trigger your cravings. Remember why you want to quit. Get plenty of sleep. Avoid coffee and other drinks that contain caffeine. These may worsen some of your symptoms. How to manage cravings Come up with a plan for how to deal with your cravings. The plan should include the following: A definition of the specific situation you want to deal with. An activity or action you will take to replace smoking. A clear idea for how this action will help. The name of someone who could help you with this. Cravings usually last for 5-10 minutes. Consider taking the following actions to help you with your plan to deal with cravings: Keep your mouth busy. Chew sugar-free gum. Suck on hard candies or a straw. Brush your teeth. Keep your hands and body busy. Change to a different activity right away. Squeeze or play with a ball. Do an activity or a hobby, such as making bead jewelry, practicing needlepoint, or working with wood. Mix up your normal routine. Take a short exercise break. Go for a quick walk, or run up and down stairs. Focus on doing something kind or helpful for someone else. Call a friend or family member to talk during a craving. Join a support group. Contact a quitline. Where to find support To get help or find a support group: Call the National Cancer Institute's Smoking Quitline: 1-800-QUIT-NOW 772-664-0603((380)831-3178) Text QUIT to SmokefreeTXT: 401027: 478848 Where to find more information Visit these websites to find more information on quitting smoking: U.S. Department of Health and Human Services: www.smokefree.gov American Lung Association: www.freedomfromsmoking.org Centers for Disease Control and Prevention (CDC): FootballExhibition.com.brwww.cdc.gov American Heart Association: www.heart.org Contact a health care provider if: You want to change your plan for quitting. The medicines you are taking are not helping. Your eating feels out of control or you  cannot sleep. You feel depressed or become very anxious. Summary Quitting smoking is a physical and mental challenge. You will face cravings, withdrawal symptoms, and temptation to smoke again. Preparation can help you as you  go through these challenges. Try different techniques to manage stress, handle social situations, and prevent weight gain. You can deal with cravings by keeping your mouth busy (such as by chewing gum), keeping your hands and body busy, calling family or friends, or contacting a quitline for people who want to quit smoking. You can deal with withdrawal symptoms by avoiding places where people smoke, getting plenty of rest, and avoiding drinks that contain caffeine. This information is not intended to replace advice given to you by your health care provider. Make sure you discuss any questions you have with your health care provider. Document Revised: 01/07/2021 Document Reviewed: 01/07/2021 Elsevier Patient Education  2023 Elsevier Inc.    Two Gram Sodium Diet 2000 mg  What is Sodium? Sodium is a mineral found naturally in many foods. The most significant source of sodium in the diet is table salt, which is about 40% sodium.  Processed, convenience, and preserved foods also contain a large amount of sodium.  The body needs only 500 mg of sodium daily to function,  A normal diet provides more than enough sodium even if you do not use salt.  Why Limit Sodium? A build up of sodium in the body can cause thirst, increased blood pressure, shortness of breath, and water retention.  Decreasing sodium in the diet can reduce edema and risk of heart attack or stroke associated with high blood pressure.  Keep in mind that there are many other factors involved in these health problems.  Heredity, obesity, lack of exercise, cigarette smoking, stress and what you eat all play a role.  General Guidelines: Do not add salt at the table or in cooking.  One teaspoon of salt contains over 2  grams of sodium. Read food labels Avoid processed and convenience foods Ask your dietitian before eating any foods not dicussed in the menu planning guidelines Consult your physician if you wish to use a salt substitute or a sodium containing medication such as antacids.  Limit milk and milk products to 16 oz (2 cups) per day.  Shopping Hints: READ LABELS!! "Dietetic" does not necessarily mean low sodium. Salt and other sodium ingredients are often added to foods during processing.    Menu Planning Guidelines Food Group Choose More Often Avoid  Beverages (see also the milk group All fruit juices, low-sodium, salt-free vegetables juices, low-sodium carbonated beverages Regular vegetable or tomato juices, commercially softened water used for drinking or cooking  Breads and Cereals Enriched white, wheat, rye and pumpernickel bread, hard rolls and dinner rolls; muffins, cornbread and waffles; most dry cereals, cooked cereal without added salt; unsalted crackers and breadsticks; low sodium or homemade bread crumbs Bread, rolls and crackers with salted tops; quick breads; instant hot cereals; pancakes; commercial bread stuffing; self-rising flower and biscuit mixes; regular bread crumbs or cracker crumbs  Desserts and Sweets Desserts and sweets mad with mild should be within allowance Instant pudding mixes and cake mixes  Fats Butter or margarine; vegetable oils; unsalted salad dressings, regular salad dressings limited to 1 Tbs; light, sour and heavy cream Regular salad dressings containing bacon fat, bacon bits, and salt pork; snack dips made with instant soup mixes or processed cheese; salted nuts  Fruits Most fresh, frozen and canned fruits Fruits processed with salt or sodium-containing ingredient (some dried fruits are processed with sodium sulfites        Vegetables Fresh, frozen vegetables and low- sodium canned vegetables Regular canned vegetables, sauerkraut, pickled vegetables, and  others prepared in brine;  frozen vegetables in sauces; vegetables seasoned with ham, bacon or salt pork  Condiments, Sauces, Miscellaneous  Salt substitute with physician's approval; pepper, herbs, spices; vinegar, lemon or lime juice; hot pepper sauce; garlic powder, onion powder, low sodium soy sauce (1 Tbs.); low sodium condiments (ketchup, chili sauce, mustard) in limited amounts (1 tsp.) fresh ground horseradish; unsalted tortilla chips, pretzels, potato chips, popcorn, salsa (1/4 cup) Any seasoning made with salt including garlic salt, celery salt, onion salt, and seasoned salt; sea salt, rock salt, kosher salt; meat tenderizers; monosodium glutamate; mustard, regular soy sauce, barbecue, sauce, chili sauce, teriyaki sauce, steak sauce, Worcestershire sauce, and most flavored vinegars; canned gravy and mixes; regular condiments; salted snack foods, olives, picles, relish, horseradish sauce, catsup   Food preparation: Try these seasonings Meats:    Pork Sage, onion Serve with applesauce  Chicken Poultry seasoning, thyme, parsley Serve with cranberry sauce  Lamb Curry powder, rosemary, garlic, thyme Serve with mint sauce or jelly  Veal Marjoram, basil Serve with current jelly, cranberry sauce  Beef Pepper, bay leaf Serve with dry mustard, unsalted chive butter  Fish Bay leaf, dill Serve with unsalted lemon butter, unsalted parsley butter  Vegetables:    Asparagus Lemon juice   Broccoli Lemon juice   Carrots Mustard dressing parsley, mint, nutmeg, glazed with unsalted butter and sugar   Green beans Marjoram, lemon juice, nutmeg,dill seed   Tomatoes Basil, marjoram, onion   Spice /blend for [employer]" 4 tsp ground thyme 1 tsp ground sage 3 tsp ground rosemary 4 tsp ground marjoram   Test your knowledge A product that says "Salt Free" may [place] contain sodium. True or False Garlic Powder and Hot Pepper Sauce an be used as alternative seasonings.True or False Processed foods have more  sodium than fresh foods.  True or False Canned Vegetables have less sodium than froze True or False   WAYS TO DECREASE YOUR SODIUM INTAKE Avoid the use of added salt in cooking and at the table.  Table salt (and other prepared seasonings which contain salt) is probably one of the greatest sources of sodium in the diet.  Unsalted foods can gain flavor from the sweet, sour, and butter taste sensations of herbs and spices.  Instead of using salt for seasoning, try the following seasonings with the foods listed.  Remember: how you use them to enhance natural food flavors is limited only by your creativity... Allspice-Meat, fish, eggs, fruit, peas, red and yellow vegetables Almond Extract-Fruit baked goods Anise Seed-Sweet breads, fruit, carrots, beets, cottage cheese, cookies (tastes like licorice) Basil-Meat, fish, eggs, vegetables, rice, vegetables salads, soups, sauces Bay Leaf-Meat, fish, stews, poultry Burnet-Salad, vegetables (cucumber-like flavor) Caraway Seed-Bread, cookies, cottage cheese, meat, vegetables, cheese, rice Cardamon-Baked goods, fruit, soups Celery Powder or seed-Salads, salad dressings, sauces, meatloaf, soup, bread.Do not use  celery salt Chervil-Meats, salads, fish, eggs, vegetables, cottage cheese (parsley-like flavor) Chili Power-Meatloaf, chicken cheese, corn, eggplant, egg dishes Chives-Salads cottage cheese, egg dishes, soups, vegetables, sauces Cilantro-Salsa, casseroles Cinnamon-Baked goods, fruit, pork, lamb, chicken, carrots Cloves-Fruit, baked goods, fish, pot roast, green beans, beets, carrots Coriander-Pastry, cookies, meat, salads, cheese (lemon-orange flavor) Cumin-Meatloaf, fish,cheese, eggs, cabbage,fruit pie (caraway flavor) [employer], fruit, eggs, fish, poultry, cottage cheese, vegetables Dill Seed-Meat, cottage cheese, poultry, vegetables, fish, salads, bread Fennel Seed-Bread, cookies, apples, pork, eggs, fish, beets, cabbage, cheese,  Licorice-like flavor Garlic-(buds or powder) Salads, meat, poultry, fish, bread, butter, vegetables, potatoes.Do not  use garlic salt Ginger-Fruit, vegetables, baked goods, meat, fish, poultry Horseradish Root-Meet, vegetables, butter  Lemon Juice or Extract-Vegetables, fruit, tea, baked goods, fish salads Mace-Baked goods fruit, vegetables, fish, poultry (taste like nutmeg) Maple Extract-Syrups Marjoram-Meat, chicken, fish, vegetables, breads, green salads (taste like Sage) Mint-Tea, lamb, sherbet, vegetables, desserts, carrots, cabbage Mustard, Dry or Seed-Cheese, eggs, meats, vegetables, poultry Nutmeg-Baked goods, fruit, chicken, eggs, vegetables, desserts Onion Powder-Meat, fish, poultry, vegetables, cheese, eggs, bread, rice salads (Do not use   Onion salt) Orange Extract-Desserts, baked goods Oregano-Pasta, eggs, cheese, onions, pork, lamb, fish, chicken, vegetables, green salads Paprika-Meat, fish, poultry, eggs, cheese, vegetables Parsley Flakes-Butter, vegetables, meat fish, poultry, eggs, bread, salads (certain forms may   Contain sodium Pepper-Meat fish, poultry, vegetables, eggs Peppermint Extract-Desserts, baked goods Poppy Seed-Eggs, bread, cheese, fruit dressings, baked goods, noodles, vegetables, cottage  [employer], poultry, meat, fish, cauliflower, turnips,eggs bread Saffron-Rice, bread, veal, chicken, fish, eggs Sage-Meat, fish, poultry, onions, eggplant, tomateos, pork, stews Savory-Eggs, salads, poultry, meat, rice, vegetables, soups, pork Tarragon-Meat, poultry, fish, eggs, butter, vegetables (licorice-like flavor)  Thyme-Meat, poultry, fish, eggs, vegetables, (clover-like flavor), sauces, soups Tumeric-Salads, butter, eggs, fish, rice, vegetables (saffron-like flavor) Vanilla Extract-Baked goods, candy Vinegar-Salads, vegetables, meat marinades Walnut Extract-baked goods, candy   2. Choose your Foods Wisely   The  following is a list of foods to avoid which are high in sodium:  Meats-Avoid all smoked, canned, salt cured, dried and kosher meat and fish as well as Anchovies   [employer] meats:Bologna, Liverwurst, Pastrami Canned meat or fish  Marinated herring Caviar    Pepperoni Corned Beef   Pizza Dried chipped beef  Salami Frozen breaded fish or meat Salt pork Frankfurters or hot dogs  Sardines Gefilte fish   Sausage Ham (boiled ham, Proscuitto Smoked butt    spiced ham)   Spam      TV Dinners Vegetables Canned vegetables (Regular) Relish Canned mushrooms  Sauerkraut Olives    Tomato juice Pickles  Bakery and Dessert Products Canned puddings  Cream pies Cheesecake   Decorated cakes Cookies  Beverages/Juices Tomato juice, regular  Gatorade   V-8 vegetable juice, regular  Breads and Cereals Biscuit mixes   Salted potato chips, corn chips, pretzels Bread stuffing mixes  Salted crackers and rolls Pancake and waffle mixes Self-rising flour  Seasonings Accent    Meat sauces Barbecue sauce  Meat tenderizer Catsup    Monosodium glutamate (MSG) Celery salt   Onion salt Chili sauce   Prepared mustard Garlic salt   Salt, seasoned salt, sea salt Gravy mixes   Soy sauce Horseradish   Steak sauce Ketchup   Tartar sauce Lite salt    Teriyaki sauce Marinade mixes   Worcestershire sauce  Others Baking powder   Cocoa and cocoa mixes Baking soda   Commercial casserole mixes Candy-caramels, chocolate  Dehydrated soups    Bars, fudge,nougats  Instant rice and pasta mixes Canned broth or soup  Maraschino cherries Cheese, aged and processed cheese and cheese spreads  Learning Assessment Quiz  Indicated T (for True) or F (for False) for each of the following statements:  _____ Fresh fruits and vegetables and unprocessed grains are generally low in sodium _____ Water may contain a considerable amount of sodium, depending on the source _____ You can always tell if a food is high  in sodium by tasting it _____ Certain laxatives my be high in sodium and should be avoided unless prescribed   by a physician or pharmacist _____ Salt substitutes may be used freely by anyone on a sodium restricted diet _____  Sodium is present in table salt, food additives and as a natural component of   most foods _____ Table salt is approximately 90% sodium _____ Limiting sodium intake may help prevent excess fluid accumulation in the body _____ On a sodium-restricted diet, seasonings such as bouillon soy sauce, and    cooking wine should be used in place of table salt _____ On an ingredient list, a product which lists monosodium glutamate as the first   ingredient is an appropriate food to include on a low sodium diet  Circle the best answer(s) to the following statements (Hint: there may be more than one correct answer)  11. On a low-sodium diet, some acceptable snack items are:    A. Olives  F. Bean dip   K. Grapefruit juice    B. Salted Pretzels G. Commercial Popcorn   L. Canned peaches    C. Carrot Sticks  H. Bouillon   M. Unsalted nuts   D. [place] fries  I. Peanut butter crackers N. Salami   E. Sweet pickles J. Tomato Juice   O. Pizza  12.  Seasonings that may be used freely on a reduced - sodium diet include   A. Lemon wedges F.Monosodium glutamate K. Celery seed    B.Soysauce   G. Pepper   L. Mustard powder   C. Sea salt  H. Cooking wine  M. Onion flakes   D. Vinegar  E. Prepared horseradish N. Salsa   E. Sage   J. Worcestershire sauce  O. Chutney     If you need a refill on your cardiac medications before your next appointment, please call your pharmacy.    Elson Clan, PA-C  07/19/2021 11:40 AM    [facility] [address] [place], [place], [place]  [address] Phone: [phone]; Fax: [phone]

## 2021-07-19 ENCOUNTER — Encounter: Payer: Self-pay | Admitting: Physician Assistant

## 2021-07-19 ENCOUNTER — Ambulatory Visit (INDEPENDENT_AMBULATORY_CARE_PROVIDER_SITE_OTHER): Payer: 59 | Admitting: Physician Assistant

## 2021-07-19 VITALS — BP 142/98 | HR 80 | Ht 73.0 in | Wt 222.0 lb

## 2021-07-19 DIAGNOSIS — I10 Essential (primary) hypertension: Secondary | ICD-10-CM | POA: Diagnosis not present

## 2021-07-19 DIAGNOSIS — E785 Hyperlipidemia, unspecified: Secondary | ICD-10-CM | POA: Diagnosis not present

## 2021-07-19 DIAGNOSIS — F17200 Nicotine dependence, unspecified, uncomplicated: Secondary | ICD-10-CM | POA: Diagnosis not present

## 2021-07-19 MED ORDER — LOSARTAN POTASSIUM 25 MG PO TABS: 25.0000 mg | ORAL_TABLET | Freq: Every day | ORAL | 3 refills | Status: DC

## 2021-07-19 NOTE — Patient Instructions (Addendum)
Medication Instructions:  Your physician has recommended you make the following change in your medication:  Start Losartan 25 mg tablets daily   Labwork: In 2 Weeks: BMET Lipid- Fasting (Nothing to eat/drink 8 hours prior to lab work)  Testing/Procedures: Chest CT  Follow-Up: Follow up in 6 months to establish with new cardiologist.   Any Other Special Instructions Will Be Listed Below (If Applicable).  Managing the Challenge of Quitting Smoking Quitting smoking is a physical and mental challenge. You may have cravings, withdrawal symptoms, and temptation to smoke. Before quitting, work with your health care provider to make a plan that can help you manage quitting. Making a plan before you quit may keep you from smoking when you have the urge to smoke while trying to quit. How to manage lifestyle changes Managing stress Stress can make you want to smoke, and wanting to smoke may cause stress. It is important to find ways to manage your stress. You could try some of the following: Practice relaxation techniques. Breathe slowly and deeply, in through your nose and out through your mouth. Listen to music. Soak in a bath or take a shower. Imagine a peaceful place or vacation. Get some support. Talk with family or friends about your stress. Join a support group. Talk with a counselor or therapist. Get some physical activity. Go for a walk, run, or bike ride. Play a favorite sport. Practice yoga.  Medicines Talk with your health care provider about medicines that might help you deal with cravings and make quitting easier for you. Relationships Social situations can be difficult when you are quitting smoking. To manage this, you can: Avoid parties and other social situations where people might be smoking. Avoid alcohol. Leave right away if you have the urge to smoke. Explain to your family and friends that you are quitting smoking. Ask for support and let them know you might be  a bit grumpy. Plan activities where smoking is not an option. General instructions Be aware that many people gain weight after they quit smoking. However, not everyone does. To keep from gaining weight, have a plan in place before you quit, and stick to the plan after you quit. Your plan should include: Eating healthy snacks. When you have a craving, it may help to: Eat popcorn, or try carrots, celery, or other cut vegetables. Chew sugar-free gum. Changing how you eat. Eat small portion sizes at meals. Eat 4-6 small meals throughout the day instead of 1-2 large meals a day. Be mindful when you eat. You should avoid watching television or doing other things that might distract you as you eat. Exercising regularly. Make time to exercise each day. If you do not have time for a long workout, do short bouts of exercise for 5-10 minutes several times a day. Do some form of strengthening exercise, such as weight lifting. Do some exercise that gets your heart beating and causes you to breathe deeply, such as walking fast, running, swimming, or biking. This is very important. Drinking plenty of water or other low-calorie or no-calorie drinks. Drink enough fluid to keep your urine pale yellow.  How to recognize withdrawal symptoms Your body and mind may experience discomfort as you try to get used to not having nicotine in your system. These effects are called withdrawal symptoms. They may include: Feeling hungrier than normal. Having trouble concentrating. Feeling irritable or restless. Having trouble sleeping. Feeling depressed. Craving a cigarette. These symptoms may surprise you, but they are normal to have  when quitting smoking. To manage withdrawal symptoms: Avoid places, people, and activities that trigger your cravings. Remember why you want to quit. Get plenty of sleep. Avoid coffee and other drinks that contain caffeine. These may worsen some of your symptoms. How to manage  cravings Come up with a plan for how to deal with your cravings. The plan should include the following: A definition of the specific situation you want to deal with. An activity or action you will take to replace smoking. A clear idea for how this action will help. The name of someone who could help you with this. Cravings usually last for 5-10 minutes. Consider taking the following actions to help you with your plan to deal with cravings: Keep your mouth busy. Chew sugar-free gum. Suck on hard candies or a straw. Brush your teeth. Keep your hands and body busy. Change to a different activity right away. Squeeze or play with a ball. Do an activity or a hobby, such as making bead jewelry, practicing needlepoint, or working with wood. Mix up your normal routine. Take a short exercise break. Go for a quick walk, or run up and down stairs. Focus on doing something kind or helpful for someone else. Call a friend or family member to talk during a craving. Join a support group. Contact a quitline. Where to find support To get help or find a support group: Call the National [employer] Institute's Smoking Quitline: 1-800-QUIT-NOW 807-313-6000) Text QUIT to SmokefreeTXT: 696295 Where to find more information Visit these websites to find more information on quitting smoking: U.[place]. Department of Health and Human Services: www.smokefree.gov American Lung Association: www.freedomfromsmoking.org Centers for Disease Control and Prevention (CDC): FootballExhibition.com.br American Heart Association: www.heart.org Contact a health care provider if: You want to change your plan for quitting. The medicines you are taking are not helping. Your eating feels out of control or you cannot sleep. You feel depressed or become very anxious. Summary Quitting smoking is a physical and mental challenge. You will face cravings, withdrawal symptoms, and temptation to smoke again. Preparation can help you as you go through these  challenges. Try different techniques to manage stress, handle social situations, and prevent weight gain. You can deal with cravings by keeping your mouth busy (such as by chewing gum), keeping your hands and body busy, calling family or friends, or contacting a quitline for people who want to quit smoking. You can deal with withdrawal symptoms by avoiding places where people smoke, getting plenty of rest, and avoiding drinks that contain caffeine. This information is not intended to replace advice given to you by your health care provider. Make sure you discuss any questions you have with your health care provider. Document Revised: 01/07/2021 Document Reviewed: 01/07/2021 Elsevier Patient Education  2023 [employer]    Two Gram Sodium Diet 2000 mg  What is Sodium? Sodium is a mineral found naturally in many foods. The most significant source of sodium in the diet is table salt, which is about 40% sodium.  Processed, convenience, and preserved foods also contain a large amount of sodium.  The body needs only 500 mg of sodium daily to function,  A normal diet provides more than enough sodium even if you do not use salt.  Why Limit Sodium? A build up of sodium in the body can cause thirst, increased blood pressure, shortness of breath, and water retention.  Decreasing sodium in the diet can reduce edema and risk of heart attack or stroke associated with high blood pressure.  Keep in mind that there are many other factors involved in these health problems.  Heredity, obesity, lack of exercise, cigarette smoking, stress and what you eat all play a role.  General Guidelines: Do not add salt at the table or in cooking.  One teaspoon of salt contains over 2 grams of sodium. Read food labels Avoid processed and convenience foods Ask your dietitian before eating any foods not dicussed in the menu planning guidelines Consult your physician if you wish to use a salt substitute or a sodium containing  medication such as antacids.  Limit milk and milk products to 16 oz (2 cups) per day.  Shopping Hints: READ LABELS!! "Dietetic" does not necessarily mean low sodium. Salt and other sodium ingredients are often added to foods during processing.    Menu Planning Guidelines Food Group Choose More Often Avoid  Beverages (see also the milk group All fruit juices, low-sodium, salt-free vegetables juices, low-sodium carbonated beverages Regular vegetable or tomato juices, commercially softened water used for drinking or cooking  Breads and Cereals Enriched white, wheat, rye and pumpernickel bread, hard rolls and dinner rolls; muffins, cornbread and waffles; most dry cereals, cooked cereal without added salt; unsalted crackers and breadsticks; low sodium or homemade bread crumbs Bread, rolls and crackers with salted tops; quick breads; instant hot cereals; pancakes; commercial bread stuffing; self-rising flower and biscuit mixes; regular bread crumbs or cracker crumbs  Desserts and Sweets Desserts and sweets mad with mild should be within allowance Instant pudding mixes and cake mixes  Fats Butter or margarine; vegetable oils; unsalted salad dressings, regular salad dressings limited to 1 Tbs; light, sour and heavy cream Regular salad dressings containing bacon fat, bacon bits, and salt pork; snack dips made with instant soup mixes or processed cheese; salted nuts  Fruits Most fresh, frozen and canned fruits Fruits processed with salt or sodium-containing ingredient (some dried fruits are processed with sodium sulfites        Vegetables Fresh, frozen vegetables and low- sodium canned vegetables Regular canned vegetables, sauerkraut, pickled vegetables, and others prepared in brine; frozen vegetables in sauces; vegetables seasoned with ham, bacon or salt pork  Condiments, Sauces, Miscellaneous  Salt substitute with physician's approval; pepper, herbs, spices; vinegar, lemon or lime juice; hot pepper  sauce; garlic powder, onion powder, low sodium soy sauce (1 Tbs.); low sodium condiments (ketchup, chili sauce, mustard) in limited amounts (1 tsp.) fresh ground horseradish; unsalted tortilla chips, pretzels, potato chips, popcorn, salsa (1/4 cup) Any seasoning made with salt including garlic salt, celery salt, onion salt, and seasoned salt; sea salt, rock salt, kosher salt; meat tenderizers; monosodium glutamate; mustard, regular soy sauce, barbecue, sauce, chili sauce, teriyaki sauce, steak sauce, Worcestershire sauce, and most flavored vinegars; canned gravy and mixes; regular condiments; salted snack foods, olives, picles, relish, horseradish sauce, catsup   Food preparation: Try these seasonings Meats:    Pork Sage, onion Serve with applesauce  Chicken Poultry seasoning, thyme, parsley Serve with cranberry sauce  Lamb Curry powder, rosemary, garlic, thyme Serve with mint sauce or jelly  Veal Marjoram, basil Serve with current jelly, cranberry sauce  Beef Pepper, bay leaf Serve with dry mustard, unsalted chive butter  Fish Bay leaf, dill Serve with unsalted lemon butter, unsalted parsley butter  Vegetables:    Asparagus Lemon juice   Broccoli Lemon juice   Carrots Mustard dressing parsley, mint, nutmeg, glazed with unsalted butter and sugar   Green beans Marjoram, lemon juice, nutmeg,dill seed   Tomatoes Basil, marjoram, onion  Spice /blend for "Salt Shaker" 4 tsp ground thyme 1 tsp ground sage 3 tsp ground rosemary 4 tsp ground marjoram   Test your knowledge A product that says "Salt Free" may Labella contain sodium. True or False Garlic Powder and Hot Pepper Sauce an be used as alternative seasonings.True or False Processed foods have more sodium than fresh foods.  True or False Canned Vegetables have less sodium than froze True or False   WAYS TO DECREASE YOUR SODIUM INTAKE Avoid the use of added salt in cooking and at the table.  Table salt (and other prepared seasonings which  contain salt) is probably one of the greatest sources of sodium in the diet.  Unsalted foods can gain flavor from the sweet, sour, and butter taste sensations of herbs and spices.  Instead of using salt for seasoning, try the following seasonings with the foods listed.  Remember: how you use them to enhance natural food flavors is limited only by your creativity... Allspice-Meat, fish, eggs, fruit, peas, red and yellow vegetables Almond Extract-Fruit baked goods Anise Seed-Sweet breads, fruit, carrots, beets, cottage cheese, cookies (tastes like licorice) Basil-Meat, fish, eggs, vegetables, rice, vegetables salads, soups, sauces Bay Leaf-Meat, fish, stews, poultry Burnet-Salad, vegetables (cucumber-like flavor) Caraway Seed-Bread, cookies, cottage cheese, meat, vegetables, cheese, rice Cardamon-Baked goods, fruit, soups Celery Powder or seed-Salads, salad dressings, sauces, meatloaf, soup, bread.Do not use  celery salt Chervil-Meats, salads, fish, eggs, vegetables, cottage cheese (parsley-like flavor) Chili Power-Meatloaf, chicken cheese, corn, eggplant, egg dishes Chives-Salads cottage cheese, egg dishes, soups, vegetables, sauces Cilantro-Salsa, casseroles Cinnamon-Baked goods, fruit, pork, lamb, chicken, carrots Cloves-Fruit, baked goods, fish, pot roast, green beans, beets, carrots Coriander-Pastry, cookies, meat, salads, cheese (lemon-orange flavor) Cumin-Meatloaf, fish,cheese, eggs, cabbage,fruit pie (caraway flavor) [employer], fruit, eggs, fish, poultry, cottage cheese, vegetables Dill Seed-Meat, cottage cheese, poultry, vegetables, fish, salads, bread Fennel Seed-Bread, cookies, apples, pork, eggs, fish, beets, cabbage, cheese, Licorice-like flavor Garlic-(buds or powder) Salads, meat, poultry, fish, bread, butter, vegetables, potatoes.Do not  use garlic salt Ginger-Fruit, vegetables, baked goods, meat, fish, poultry Horseradish Root-Meet, vegetables, butter Lemon Juice or  Extract-Vegetables, fruit, tea, baked goods, fish salads Mace-Baked goods fruit, vegetables, fish, poultry (taste like nutmeg) Maple Extract-Syrups Marjoram-Meat, chicken, fish, vegetables, breads, green salads (taste like Sage) Mint-Tea, lamb, sherbet, vegetables, desserts, carrots, cabbage Mustard, Dry or Seed-Cheese, eggs, meats, vegetables, poultry Nutmeg-Baked goods, fruit, chicken, eggs, vegetables, desserts Onion Powder-Meat, fish, poultry, vegetables, cheese, eggs, bread, rice salads (Do not use   Onion salt) Orange Extract-Desserts, baked goods Oregano-Pasta, eggs, cheese, onions, pork, lamb, fish, chicken, vegetables, green salads Paprika-Meat, fish, poultry, eggs, cheese, vegetables Parsley Flakes-Butter, vegetables, meat fish, poultry, eggs, bread, salads (certain forms may   Contain sodium Pepper-Meat fish, poultry, vegetables, eggs Peppermint Extract-Desserts, baked goods Poppy Seed-Eggs, bread, cheese, fruit dressings, baked goods, noodles, vegetables, cottage  [employer], poultry, meat, fish, cauliflower, turnips,eggs bread Saffron-Rice, bread, veal, chicken, fish, eggs Sage-Meat, fish, poultry, onions, eggplant, tomateos, pork, stews Savory-Eggs, salads, poultry, meat, rice, vegetables, soups, pork Tarragon-Meat, poultry, fish, eggs, butter, vegetables (licorice-like flavor)  Thyme-Meat, poultry, fish, eggs, vegetables, (clover-like flavor), sauces, soups Tumeric-Salads, butter, eggs, fish, rice, vegetables (saffron-like flavor) Vanilla Extract-Baked goods, candy Vinegar-Salads, vegetables, meat marinades Walnut Extract-baked goods, candy   2. Choose your Foods Wisely   The following is a list of foods to avoid which are high in sodium:  Meats-Avoid all smoked, canned, salt cured, dried and kosher meat and fish as well as Anchovies   Lox Tomasa Blase  Luncheon meats:Bologna, Liverwurst, Pastrami Canned meat or fish  Marinated  herring Caviar    Pepperoni Corned Beef   Pizza Dried chipped beef  Salami Frozen breaded fish or meat Salt pork Frankfurters or hot dogs  Sardines Gefilte fish   Sausage Ham (boiled ham, Proscuitto Smoked butt    spiced ham)   Spam      TV Dinners Vegetables Canned vegetables (Regular) Relish Canned mushrooms  Sauerkraut Olives    Tomato juice Pickles  Bakery and Dessert Products Canned puddings  Cream pies Cheesecake   Decorated cakes Cookies  Beverages/Juices Tomato juice, regular  Gatorade   V-8 vegetable juice, regular  Breads and Cereals Biscuit mixes   Salted potato chips, corn chips, pretzels Bread stuffing mixes  Salted crackers and rolls Pancake and waffle mixes Self-rising flour  Seasonings Accent    Meat sauces Barbecue sauce  Meat tenderizer Catsup    Monosodium glutamate (MSG) Celery salt   Onion salt Chili sauce   Prepared mustard Garlic salt   Salt, seasoned salt, sea salt Gravy mixes   Soy sauce Horseradish   Steak sauce Ketchup   Tartar sauce Lite salt    Teriyaki sauce Marinade mixes   Worcestershire sauce  Others Baking powder   Cocoa and cocoa mixes Baking soda   Commercial casserole mixes Candy-caramels, chocolate  Dehydrated soups    Bars, fudge,nougats  Instant rice and pasta mixes Canned broth or soup  Maraschino cherries Cheese, aged and processed cheese and cheese spreads  Learning Assessment Quiz  Indicated T (for True) or F (for False) for each of the following statements:  _____ Fresh fruits and vegetables and unprocessed grains are generally low in sodium _____ Water may contain a considerable amount of sodium, depending on the source _____ You can always tell if a food is high in sodium by tasting it _____ Certain laxatives my be high in sodium and should be avoided unless prescribed   by a physician or pharmacist _____ Salt substitutes may be used freely by anyone on a sodium restricted diet _____ Sodium is present in table  salt, food additives and as a natural component of   most foods _____ Table salt is approximately 90% sodium _____ Limiting sodium intake may help prevent excess fluid accumulation in the body _____ On a sodium-restricted diet, seasonings such as bouillon soy sauce, and    cooking wine should be used in place of table salt _____ On an ingredient list, a product which lists monosodium glutamate as the first   ingredient is an appropriate food to include on a low sodium diet  Circle the best answer(s) to the following statements (Hint: there may be more than one correct answer)  11. On a low-sodium diet, some acceptable snack items are:    A. Olives  F. Bean dip   K. Grapefruit juice    B. Salted Pretzels G. Commercial Popcorn   L. Canned peaches    C. Carrot Sticks  H. Bouillon   M. Unsalted nuts   D. [place] fries  I. Peanut butter crackers N. Salami   E. Sweet pickles J. Tomato Juice   O. Pizza  12.  Seasonings that may be used freely on a reduced - sodium diet include   A. Lemon wedges F.Monosodium glutamate K. Celery seed    B.Soysauce   G. Pepper   L. Mustard powder   C. Sea salt  H. Cooking wine  M. Onion flakes   D. Vinegar  E. Prepared  horseradish N. Salsa   E. Sage   J. Worcestershire sauce  O. Chutney     If you need a refill on your cardiac medications before your next appointment, please call your pharmacy.

## 2021-07-20 ENCOUNTER — Telehealth: Payer: Self-pay | Admitting: Cardiovascular Disease

## 2021-07-20 MED ORDER — AMLODIPINE BESYLATE 10 MG PO TABS: 10.0000 mg | ORAL_TABLET | Freq: Every day | ORAL | 1 refills | Status: DC

## 2021-07-20 NOTE — Telephone Encounter (Signed)
*  STAT* If patient is at the pharmacy, call can be transferred to refill team.   1. Which medications need to be refilled? (please list name of each medication and dose if known)   amLODipine (NORVASC) 10 MG tablet    2. Which pharmacy/location (including street and city if local pharmacy) is medication to be sent to? [id number] - [place], [place] - [address]  3. Do they need a 30 day or 90 day supply? [id number]  Pharmacy called and said the last refill stated that he needed to be seen and he was seen yesterday.

## 2021-07-28 ENCOUNTER — Ambulatory Visit (INDEPENDENT_AMBULATORY_CARE_PROVIDER_SITE_OTHER): Payer: 59 | Admitting: Orthopaedic Surgery

## 2021-07-28 ENCOUNTER — Encounter: Payer: Self-pay | Admitting: Orthopaedic Surgery

## 2021-07-28 VITALS — Ht 73.0 in | Wt 222.0 lb

## 2021-07-28 DIAGNOSIS — Z96652 Presence of left artificial knee joint: Secondary | ICD-10-CM | POA: Diagnosis not present

## 2021-07-28 NOTE — Progress Notes (Signed)
Office Visit Note   Patient: Brent Nguyen           Date of Birth: [birth year]           MRN: [id number] Visit Date: 07/28/2021              Requested by: Pearson Grippe, MD [address] [place],  [place] [address] PCP: Pearson Grippe, MD   Assessment & Plan: Visit Diagnoses:  1. S/P total knee arthroplasty, left     Plan: Patient has weak quad post total knee arthroplasty mild swelling and some problems with occasional hyperextension of his knee from his weak quad when he fatigues.  He will work hard on quad strengthening recheck 5 weeks.  Follow-Up Instructions: Return in about 5 weeks (around 09/01/2021).   Orders:  No orders of the defined types were placed in this encounter.  No orders of the defined types were placed in this encounter.     Procedures: No procedures performed   Clinical Data: No additional findings.   Subjective: Chief Complaint  Patient presents with   Left Knee - Routine Post Op    L TKR DOS 04/04/21    HPI post left total knee arthroplasty.  He Ludemann has some mild swelling at the end of the day noticed some limping he grabs the rails when he uses stairs he has noticed problems going down he will.  Previous right total knee takes good quad strength left is weak.  Review of Systems unchanged no fever or chills.   Objective: Vital Signs: Ht 6\' 1"  (1.854 m)   Wt 222 lb (100.7 kg)   BMI 29.29 kg/m   Physical Exam Constitutional:      Appearance: He is well-developed.  HENT:     Head: Normocephalic and atraumatic.     Right Ear: External ear normal.     Left Ear: External ear normal.  Eyes:     Pupils: Pupils are equal, round, and reactive to light.  Neck:     Thyroid: No thyromegaly.     Trachea: No tracheal deviation.  Cardiovascular:     Rate and Rhythm: Normal rate.  Pulmonary:     Effort: Pulmonary effort is normal.     Breath sounds: No wheezing.  Abdominal:     General: Bowel sounds are normal.     Palpations:  Abdomen is soft.  Musculoskeletal:     Cervical back: Neck supple.  Skin:    General: Skin is warm and dry.     Capillary Refill: Capillary refill takes less than 2 seconds.  Neurological:     Mental Status: He is alert and oriented to person, place, and time.  Psychiatric:        Behavior: Behavior normal.        Thought Content: Thought content normal.        Judgment: Judgment normal.     Ortho Exam I can overcome his quad with 2 fingers at the ankle on the left opposite right takes full hand pressure.  Good flexion full passive extension he is amatory with a slight weak quad gait with some hip flexion when he weightbears on the left side.  Pulses are normal trace knee effusion.  Specialty Comments:  No specialty comments available.  Imaging: No results found.   PMFS History: Patient Active Problem List   Diagnosis Date Noted   S/P total knee arthroplasty, left 04/21/2021   Chest pain 05/17/2013   Palpitations 05/17/2013  Hyperlipidemia 04/25/2013   Diverticulosis s/p partial colectomy with reversal    HTN (hypertension), severe 09/17/2012   Tobacco abuse 09/17/2012   Obesity (BMI 30-39.9) 09/17/2012   Past Medical History:  Diagnosis Date   Chronic back pain    Diverticulosis    a. s/p partial colectomy 2005 for anastamotic leak with reversal.   History of kidney stones    Hyperlipidemia    Hypertension    Normal cardiac stress test 2015   low risk   Obesity    Sciatica, left side    Tobacco abuse     Family History  Problem Relation Age of Onset   Hypertension Mother    Diverticulosis Mother    Sleep apnea Mother    Celiac disease Mother    Ulcers Mother    Cancer Father        Brain   Diverticulosis Brother    Ulcers Brother    Celiac disease Brother    Hypotension Brother    Cancer Maternal Grandmother        Breast   Ulcers Maternal Grandfather    ALS Maternal Grandfather    Diabetes Maternal Grandfather    Heart attack Paternal Grandmother     Hypertension Paternal Grandmother    Cancer Paternal Grandfather        Melanoma   Diverticulosis Daughter     Past Surgical History:  Procedure Laterality Date   BACK SURGERY  2021   "two-level instrumented fusion without interbody cages" in [place]   COLOSTOMY REVERSAL     KNEE ARTHROSCOPY Right    KNEE ARTHROSCOPY Right 01/20/2013   Procedure: ARTHROSCOPY KNEE;  Surgeon: Darreld Mclean, MD;  Location: [facility];  Service: Orthopedics;  Laterality: Right;   MENISECTOMY Right 01/20/2013   Procedure: partial medial menisectomy;  Surgeon: Darreld Mclean, MD;  Location: [facility];  Service: Orthopedics;  Laterality: Right;   PARTIAL COLECTOMY     2 subsequent surgeries to reverse his colostomy-diverticulosis   TOTAL KNEE ARTHROPLASTY Left 04/04/2021   Procedure: Left TOTAL KNEE ARTHROPLASTY;  Surgeon: [name], MD;  Location: [facility];  Service: Orthopedics;  Laterality: Left;   WISDOM TOOTH EXTRACTION     Social History   Occupational History   Not on file  Tobacco Use   Smoking status: Every Day    Packs/day: 1.00    Years: 25.00    Total pack years: 25.00    Types: Cigarettes   Smokeless tobacco: Former  Building services engineer Use: Some days   Substances: THC  Substance and Sexual Activity   Alcohol use: Yes    Comment: On occasion, 1 or 2 beers per month   Drug use: No   Sexual activity: Yes    Birth control/protection: None

## 2021-08-22 ENCOUNTER — Ambulatory Visit (HOSPITAL_COMMUNITY): Payer: 59

## 2021-08-23 ENCOUNTER — Telehealth: Payer: Self-pay

## 2021-08-23 NOTE — Telephone Encounter (Signed)
Patients insurance company, [employer], is requesting an updated work status for patient. He was last seen in [place] on 07/28/21.  Please advise and I can create work note for him. He had surgery in March and they are requesting and update from 05/27/21 to present.

## 2021-08-23 NOTE — Telephone Encounter (Signed)
I do have this and will fax back to [employer]. Thanks.

## 2021-09-01 ENCOUNTER — Ambulatory Visit (INDEPENDENT_AMBULATORY_CARE_PROVIDER_SITE_OTHER): Payer: 59 | Admitting: Orthopaedic Surgery

## 2021-09-01 DIAGNOSIS — M6281 Muscle weakness (generalized): Secondary | ICD-10-CM | POA: Diagnosis not present

## 2021-09-01 DIAGNOSIS — Z96652 Presence of left artificial knee joint: Secondary | ICD-10-CM | POA: Diagnosis not present

## 2021-09-01 NOTE — Progress Notes (Signed)
Office Visit Note   Patient: Brent Nguyen           Date of Birth: 1966/01/11           MRN: [id number] Visit Date: 09/01/2021              Requested by: Pearson Grippe, MD [address] [place],  [place] [address] PCP: Pearson Grippe, MD   Assessment & Plan: Visit Diagnoses:  1. S/P total knee arthroplasty, left   2. Quadriceps weakness     Plan: Patient Brent Nguyen has quad strength deficit we gave him several exercises to work on.  We demonstrated his opposite quad strength he knows exactly what he needs to work on to fix his strength deficit and give him resolution of his mild knee swelling and knee aching since he Brent Nguyen has quad weakness.  He can follow-up as needed.  Follow-Up Instructions: No follow-ups on file.   Orders:  No orders of the defined types were placed in this encounter.  No orders of the defined types were placed in this encounter.     Procedures: No procedures performed   Clinical Data: No additional findings.   Subjective: No chief complaint on file.   HPI 56 year old male returns 5 months post left total knee arthroplasty.  He is ambulatory without a cane.  He states he continues to have some pains and swelling at the end of the day and some problems with activities.  He has problems when he does yard work.  Patient is happy with his full extension and has good flexion.  Review of Systems positive for tobacco abuse ,history of chest pain hypertension.  All the systems are noncontributory HPI.   Objective: Vital Signs: There were no vitals taken for this visit.  Physical Exam Constitutional:      Appearance: He is well-developed.  HENT:     Head: Normocephalic and atraumatic.     Right Ear: External ear normal.     Left Ear: External ear normal.  Eyes:     Pupils: Pupils are equal, round, and reactive to light.  Neck:     Thyroid: No thyromegaly.     Trachea: No tracheal deviation.  Cardiovascular:     Rate and Rhythm: Normal  rate.  Pulmonary:     Effort: Pulmonary effort is normal.     Breath sounds: No wheezing.  Abdominal:     General: Bowel sounds are normal.     Palpations: Abdomen is soft.  Musculoskeletal:     Cervical back: Neck supple.  Skin:    General: Skin is warm and dry.     Capillary Refill: Capillary refill takes less than 2 seconds.  Neurological:     Mental Status: He is alert and oriented to person, place, and time.  Psychiatric:        Behavior: Behavior normal.        Thought Content: Thought content normal.        Judgment: Judgment normal.     Ortho Exam patient has slight hop step when he goes up on the single step in the exam room left foot first.  Has some problems doing left leg single stance going down with toe touches on the right from the step.  Full extension good flexion trace effusion.  Specialty Comments:  No specialty comments available.  Imaging: No results found.   PMFS History: Patient Active Problem List   Diagnosis Date Noted   Quadriceps weakness 09/01/2021  S/P total knee arthroplasty, left 04/21/2021   Chest pain 05/17/2013   Palpitations 05/17/2013   Hyperlipidemia 04/25/2013   Diverticulosis s/p partial colectomy with reversal    HTN (hypertension), severe 09/17/2012   Tobacco abuse 09/17/2012   Obesity (BMI 30-39.9) 09/17/2012   Past Medical History:  Diagnosis Date   Chronic back pain    Diverticulosis    a. s/p partial colectomy 2005 for anastamotic leak with reversal.   History of kidney stones    Hyperlipidemia    Hypertension    Normal cardiac stress test 2015   low risk   Obesity    Sciatica, left side    Tobacco abuse     Family History  Problem Relation Age of Onset   Hypertension Mother    Diverticulosis Mother    Sleep apnea Mother    Celiac disease Mother    Ulcers Mother    Cancer Father        Brain   Diverticulosis Brother    Ulcers Brother    Celiac disease Brother    Hypotension Brother    Cancer Maternal  Grandmother        Breast   Ulcers Maternal Grandfather    ALS Maternal Grandfather    Diabetes Maternal Grandfather    Heart attack Paternal Grandmother    Hypertension Paternal Grandmother    Cancer Paternal Grandfather        Melanoma   Diverticulosis Daughter     Past Surgical History:  Procedure Laterality Date   BACK SURGERY  2021   "two-level instrumented fusion without interbody cages" in [place]   COLOSTOMY REVERSAL     KNEE ARTHROSCOPY Right    KNEE ARTHROSCOPY Right 01/20/2013   Procedure: ARTHROSCOPY KNEE;  Surgeon: Darreld Mclean, MD;  Location: [facility];  Service: Orthopedics;  Laterality: Right;   MENISECTOMY Right 01/20/2013   Procedure: partial medial menisectomy;  Surgeon: Darreld Mclean, MD;  Location: [facility];  Service: Orthopedics;  Laterality: Right;   PARTIAL COLECTOMY     2 subsequent surgeries to reverse his colostomy-diverticulosis   TOTAL KNEE ARTHROPLASTY Left 04/04/2021   Procedure: Left TOTAL KNEE ARTHROPLASTY;  Surgeon: [name], MD;  [facility];  Service: Orthopedics;  Laterality: Left;   WISDOM TOOTH EXTRACTION     Social History   Occupational History   Not on file  Tobacco Use   Smoking status: Every Day    Packs/day: 1.00    Years: 25.00    Total pack years: 25.00    Types: Cigarettes   Smokeless tobacco: Former  Building services engineer Use: Some days   Substances: THC  Substance and Sexual Activity   Alcohol use: Yes    Comment: On occasion, 1 or 2 beers per month   Drug use: No   Sexual activity: Yes    Birth control/protection: None

## 2021-12-28 DIAGNOSIS — Z809 Family history of malignant neoplasm, unspecified: Secondary | ICD-10-CM | POA: Diagnosis not present

## 2021-12-28 DIAGNOSIS — Z880 Allergy status to penicillin: Secondary | ICD-10-CM | POA: Diagnosis not present

## 2021-12-28 DIAGNOSIS — Z833 Family history of diabetes mellitus: Secondary | ICD-10-CM | POA: Diagnosis not present

## 2021-12-28 DIAGNOSIS — Z9181 History of falling: Secondary | ICD-10-CM | POA: Diagnosis not present

## 2021-12-28 DIAGNOSIS — R69 Illness, unspecified: Secondary | ICD-10-CM | POA: Diagnosis not present

## 2021-12-28 DIAGNOSIS — Z8249 Family history of ischemic heart disease and other diseases of the circulatory system: Secondary | ICD-10-CM | POA: Diagnosis not present

## 2021-12-28 DIAGNOSIS — I10 Essential (primary) hypertension: Secondary | ICD-10-CM | POA: Diagnosis not present

## 2021-12-28 DIAGNOSIS — Z720 Tobacco use: Secondary | ICD-10-CM | POA: Diagnosis not present

## 2021-12-28 DIAGNOSIS — G8929 Other chronic pain: Secondary | ICD-10-CM | POA: Diagnosis not present

## 2022-01-05 ENCOUNTER — Other Ambulatory Visit: Payer: Self-pay | Admitting: Cardiovascular Disease

## 2022-01-12 ENCOUNTER — Other Ambulatory Visit: Payer: Self-pay | Admitting: Cardiovascular Disease

## 2022-01-19 NOTE — Progress Notes (Signed)
Cardiology Office Note    Date:  01/27/2022   ID:  Brent Nguyen, DOB January 16, 1966, MRN [id number]   PCP:  Pearson Grippe, MD   [facility]  Cardiologist:  Thurmon Fair, MD  New to me  Advanced Practice Provider:  No care team member to display Electrophysiologist:  None   [id number]}   History of Present Illness:  Brent Nguyen is a 56 y.o. male with history of HTN, HLD, smoker, obestiy.   Last saw Dr. Royann Shivers 10/09/2019 and having trouble with back pain.  Patient can no longer travel to [place] for appt so will be followed in [place].  He is new to me 01/27/2022   Patient comes in for f/u. Had left knee replacement in March and [name] having a lot of trouble with it.  Seen by Ophelia Charter 09/01/21 describes swelling and quad weakness BP improved  He eats out a lot mostly fried foods. Smoking 1 ppd. Quit drinking daliy.  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 Deferred back stimulator as it was too painful   Bruises easily No frank bleeding Not on ASA Compliant with BP meds   Past Medical History:  Diagnosis Date   Chronic back pain    Diverticulosis    a. s/p partial colectomy 2005 for anastamotic leak with reversal.   History of kidney stones    Hyperlipidemia    Hypertension    Normal cardiac stress test 2015   low risk   Obesity    Sciatica, left side    Tobacco abuse     Past Surgical History:  Procedure Laterality Date   BACK SURGERY  2021   "two-level instrumented fusion without interbody cages" in [place]   COLOSTOMY REVERSAL     KNEE ARTHROSCOPY Right    KNEE ARTHROSCOPY Right 01/20/2013   Procedure: ARTHROSCOPY KNEE;  Surgeon: Darreld Mclean, MD;  Location: [facility];  Service: Orthopedics;  Laterality: Right;    MENISECTOMY Right 01/20/2013   Procedure: partial medial menisectomy;  Surgeon: Darreld Mclean, MD;  Location: [facility];  Service: Orthopedics;  Laterality: Right;   PARTIAL COLECTOMY     2 subsequent surgeries to reverse his colostomy-diverticulosis   TOTAL KNEE ARTHROPLASTY Left 04/04/2021   Procedure: Left TOTAL KNEE ARTHROPLASTY;  Surgeon: [name], MD;  [facility];  Service: Orthopedics;  Laterality: Left;   WISDOM TOOTH EXTRACTION      Current Medications: Current Meds  Medication Sig   amLODipine (NORVASC) 10 MG tablet TAKE 1 TABLET BY MOUTH EVERY DAY   carvedilol (COREG) 25 MG tablet TAKE 1 TABLET (25 MG TOTAL) BY MOUTH TWICE A DAY WITH MEALS   losartan (COZAAR) 25 MG tablet Take 1 tablet (25 mg total) by mouth daily.     Allergies:   Penicillins   Social History   Socioeconomic History   Marital status: Married    Spouse name: Not on file   Number of children: Not on file   Years of education: Not on file   Highest education level: Not on file  Occupational History   Not on file  Tobacco Use   Smoking status: Every Day    Packs/day: 1.00    Years: 25.00  Total pack years: 25.00    Types: Cigarettes   Smokeless tobacco: Former  Building services engineerVaping Use   Vaping Use: Some days   Substances: THC  Substance and Sexual Activity   Alcohol use: Yes    Comment: On occasion, 1 or 2 beers per month   Drug use: No   Sexual activity: Yes    Birth control/protection: None  Other Topics Concern   Not on file  Social History Narrative   Not on file   Social Determinants of Health   Financial Resource Strain: Low Risk  (02/20/2020)   Overall Financial Resource Strain (CARDIA)    Difficulty of Paying Living Expenses: Not hard at all  Food Insecurity: No Food Insecurity (02/20/2020)   Hunger Vital Sign    Worried About Running Out of Food in the Last Year: Never true    Ran Out of Food in the Last Year: Never true  Transportation Needs: No Transportation Needs (02/20/2020)    PRAPARE - Administrator, Civil ServiceTransportation    Lack of Transportation (Medical): No    Lack of Transportation (Non-Medical): No  Physical Activity: Sufficiently Active (02/20/2020)   Exercise Vital Sign    Days of Exercise per Week: 3 days    Minutes of Exercise per Session: 60 min  Stress: No Stress Concern Present (02/20/2020)   [employer] - Occupational Stress Questionnaire    Feeling of Stress : Not at all  Social Connections: Moderately Isolated (02/20/2020)   Social Connection and Isolation Panel [NHANES]    Frequency of Communication with Friends and Family: More than three times a week    Frequency of Social Gatherings with Friends and Family: More than three times a week    Attends Religious Services: Never    Database administratorActive Member of Clubs or Organizations: No    Attends Engineer, structuralClub or Organization Meetings: Never    Marital Status: Married     Family History:  The patient's  family history includes ALS in his maternal grandfather; Cancer in his father, maternal grandmother, and paternal grandfather; Celiac disease in his brother and mother; Diabetes in his maternal grandfather; Diverticulosis in his brother, daughter, and mother; Heart attack in his paternal grandmother; Hypertension in his mother and paternal grandmother; Hypotension in his brother; Sleep apnea in his mother; Ulcers in his brother, maternal grandfather, and mother.   ROS:   Please see the history of present illness.    ROS All other systems reviewed and are negative.   PHYSICAL EXAM:   VS:  BP 119/81   Pulse 81   Ht 6\' 1"  (1.854 m)   Wt 228 lb 9.6 oz (103.7 kg)   SpO2 95%   BMI 30.16 kg/m   Physical Exam   Affect appropriate Healthy:  appears stated age HEENT: normal Neck supple with no adenopathy JVP normal no bruits no thyromegaly Lungs clear with no wheezing and good diaphragmatic motion Heart:  S1/S2 no murmur, no rub, gallop or click PMI normal Abdomen: benighn, BS positve, no tenderness, no  AAA no bruit.  No HSM or HJR Distal pulses intact with no bruits No edema Neuro non-focal Post left TKR  Post lumbar back surgery   Wt Readings from Last 3 Encounters:  01/27/22 228 lb 9.6 oz (103.7 kg)  07/28/21 222 lb (100.7 kg)  07/19/21 222 lb (100.7 kg)      Studies/Labs Reviewed:   EKG:  EKG is not ordered today.     Recent Labs: 03/31/2021: ALT 20 04/05/2021: BUN 13; Creatinine,  Ser 0.89; Potassium 3.9; Sodium 134 04/06/2021: Hemoglobin 13.6; Platelets 233   Lipid Panel    Component Value Date/Time   CHOL 174 10/08/2019 1512   TRIG 74 10/08/2019 1512   HDL 43 10/08/2019 1512   CHOLHDL 4.0 10/08/2019 1512   LDLCALC 117 (H) 10/08/2019 1512    Additional studies/ records that were reviewed today include:    Echo 2018 Study Conclusions   - Left ventricle: The cavity size was normal. Wall thickness was    increased in a pattern of moderate LVH. Systolic function was    normal. The estimated ejection fraction was in the range of 60%    to 65%. Wall motion was normal; there were no regional wall    motion abnormalities. Doppler parameters are consistent with    abnormal left ventricular relaxation (grade 1 diastolic    dysfunction). The E/e&' ratio is between 8-15, suggesting    indeterminate LV filling pressure.  - Aorta: Dilated aortic root and ascending aorta. Aortic root    dimension: 41 mm (ED). Ascending aortic diameter: 40 mm (S).  - Mitral valve: Mildly thickened leaflets . There was trivial    regurgitation.  - Left atrium: The atrium was normal in size.  - Inferior vena cava: The vessel was normal in size. The    respirophasic diameter changes were in the normal range (>= 50%),    consistent with normal central venous pressure.   Impressions:   - LVEF 60-65, moderate LVH, normal wall motion, grade 1 DD with    indeterminate LV filling pressure, dilated aortic root and    ascending aorta, trivial MR, normal LA size, normal IVC.    -------------------------------------------------------------------  Study data:   Study status:  Routine.  Procedure:  The patient  reported no pain pre or post test. Transthoracic echocardiography.  Image quality was adequate.  Study completion:  There were no  complications.          Transthoracic echocardiography.  M-mode,  complete 2D, spectral Doppler, and color Doppler.  Birthdate:  Patient birthdate: [birth year].  Age:  Patient is 56 yr old.  Sex:  Gender: male.    BMI: 32.1 kg/m^2.  Blood pressure:     140/88  Patient status:  Outpatient.  Study date:  Study date: 05/11/2016.  Study time: 03:50 PM.  Location:  [facility]   -------------------------------------------------------------------   -------------------------------------------------------------------  Left ventricle:  The cavity size was normal. Wall thickness was  increased in a pattern of moderate LVH. Systolic function was  normal. The estimated ejection fraction was in the range of 60% to  65%. Wall motion was normal; there were no regional wall motion  abnormalities. Doppler parameters are consistent with abnormal left  ventricular relaxation (grade 1 diastolic dysfunction). The E/e&'  ratio is between 8-15, suggesting indeterminate LV filling  pressure.   -------------------------------------------------------------------  Aortic valve:   Structurally normal valve. Trileaflet. Cusp  separation was normal.  Doppler:  Transvalvular velocity was within  the normal range. There was no stenosis. There was no  regurgitation.   -------------------------------------------------------------------  Aorta: Dilated aortic root and ascending aorta.   -------------------------------------------------------------------  Mitral valve:   Mildly thickened leaflets .  Doppler:  There was  trivial regurgitation.    Peak gradient (D): 2 mm Hg.   -------------------------------------------------------------------  Left  atrium:  The atrium was normal in size.   -------------------------------------------------------------------  Atrial septum:  No defect or patent foramen ovale was identified.    -------------------------------------------------------------------  Right  ventricle:  The cavity size was normal. Wall thickness was  normal. Systolic function was normal.   -------------------------------------------------------------------  Pulmonic valve:    The valve appears to be grossly normal.  Doppler:  There was no significant regurgitation.   -------------------------------------------------------------------  Tricuspid valve:   Doppler:  There was no significant  regurgitation.   -------------------------------------------------------------------  Pulmonary artery:   The main pulmonary artery was normal-sized.   -------------------------------------------------------------------  Right atrium:  The atrium was normal in size.   -------------------------------------------------------------------  Pericardium: There was no pericardial effusion.   -------------------------------------------------------------------  Systemic veins:  Inferior vena cava: The vessel was normal in size. The  respirophasic diameter changes were in the normal range (>= 50%),  consistent with normal central venous pressure.   -------------------------------------------------------------------  Post procedure conclusions  Ascending Aorta:   - Dilated aortic root and ascending aorta.   -------------------------------------------------------------------  Measurements   PLAN:  In order of problems listed above:  HTN-BP better on losartan [place] needs work on diet and salt intake    HLD-needs FLP not on meds  Tobacco abuse-smoking cessation discussed. Has smoked 40 yrs. Check screening CT.  Bruising:  encouraged him to f/u with primary and get lab testing PLT normal 04/06/21 and no untoward Bleeding during back  and knee surgery   Shared Decision Making/Informed Consent      Lung cancer screening CT  F/U  in a year   Medication Adjustments/Labs and Tests Ordered: Current medicines are reviewed at length with the patient today.  Concerns regarding medicines are outlined above.  Medication changes, Labs and Tests ordered today are listed in the Patient Instructions below. There are no Patient Instructions on file for this visit.    Signed, Charlton Haws, MD  01/27/2022 10:42 AM    [facility] [address] [place], [place], [place]  [address] Phone: [phone]; Fax: [phone]

## 2022-01-27 ENCOUNTER — Ambulatory Visit: Payer: Medicare HMO | Attending: Cardiovascular Disease | Admitting: Cardiovascular Disease

## 2022-01-27 ENCOUNTER — Encounter: Payer: Self-pay | Admitting: Cardiovascular Disease

## 2022-01-27 VITALS — BP 119/81 | HR 81 | Ht 73.0 in | Wt 228.6 lb

## 2022-01-27 DIAGNOSIS — Z122 Encounter for screening for malignant neoplasm of respiratory organs: Secondary | ICD-10-CM | POA: Diagnosis not present

## 2022-01-27 DIAGNOSIS — E785 Hyperlipidemia, unspecified: Secondary | ICD-10-CM

## 2022-01-27 DIAGNOSIS — F17200 Nicotine dependence, unspecified, uncomplicated: Secondary | ICD-10-CM

## 2022-01-27 DIAGNOSIS — T148XXA Other injury of unspecified body region, initial encounter: Secondary | ICD-10-CM | POA: Diagnosis not present

## 2022-01-27 DIAGNOSIS — I10 Essential (primary) hypertension: Secondary | ICD-10-CM

## 2022-01-27 DIAGNOSIS — R69 Illness, unspecified: Secondary | ICD-10-CM | POA: Diagnosis not present

## 2022-01-27 NOTE — Patient Instructions (Signed)
Medication Instructions:  Your physician recommends that you continue on your current medications as directed. Please refer to the Current Medication list given to you today.   Labwork: None  Testing/Procedures: Lung Cancer Screening CT  Follow-Up: Follow up with Dr. Eden Emms in 1 year.   Any Other Special Instructions Will Be Listed Below (If Applicable).     If you need a refill on your cardiac medications before your next appointment, please call your pharmacy.

## 2022-02-16 ENCOUNTER — Telehealth: Payer: Self-pay | Admitting: Cardiovascular Disease

## 2022-02-16 MED ORDER — LOSARTAN POTASSIUM 25 MG PO TABS: 25.0000 mg | ORAL_TABLET | Freq: Every day | ORAL | 3 refills | Status: DC

## 2022-02-16 NOTE — Telephone Encounter (Signed)
*  STAT* If patient is at the pharmacy, call can be transferred to refill team.   1. Which medications need to be refilled? (please list name of each medication and dose if known) losartan (COZAAR) 25 MG tablet   2. Which pharmacy/location (including street and city if local pharmacy) is medication to be sent to?  [id number] - [place], [place] - [place]    3. Do they need a 30 day or 90 day supply? 30  Pharmacy states they tried to send it under [place], [place] however her license came up inactive in their system.

## 2022-02-16 NOTE — Telephone Encounter (Signed)
*  STAT* If patient is at the pharmacy, call can be transferred to refill team.   1. Which medications need to be refilled? (please list name of each medication and dose if known)   losartan (COZAAR) 25 MG tablet   2. Which pharmacy/location (including street and city if local pharmacy) is medication to be sent to?  [id number] - [place], [place] - [place]   3. Do they need a 30 day or 90 day supply?  90 day  Patient stated that he is completely out of this medication.

## 2022-03-13 ENCOUNTER — Ambulatory Visit (HOSPITAL_COMMUNITY): Payer: 59

## 2022-05-26 DIAGNOSIS — Z008 Encounter for other general examination: Secondary | ICD-10-CM | POA: Diagnosis not present

## 2022-12-09 ENCOUNTER — Encounter (HOSPITAL_COMMUNITY): Payer: Self-pay

## 2022-12-09 ENCOUNTER — Other Ambulatory Visit: Payer: Self-pay

## 2022-12-09 ENCOUNTER — Emergency Department (HOSPITAL_COMMUNITY): Payer: 59

## 2022-12-09 ENCOUNTER — Emergency Department (HOSPITAL_COMMUNITY)
Admission: EM | Admit: 2022-12-09 | Discharge: 2022-12-09 | Disposition: A | Discharge status: Home / Self Care | Payer: 59 | Attending: Emergency Medicine | Admitting: Emergency Medicine

## 2022-12-09 DIAGNOSIS — M25562 Pain in left knee: Secondary | ICD-10-CM | POA: Diagnosis not present

## 2022-12-09 DIAGNOSIS — W1789XA Other fall from one level to another, initial encounter: Secondary | ICD-10-CM | POA: Insufficient documentation

## 2022-12-09 DIAGNOSIS — Z043 Encounter for examination and observation following other accident: Secondary | ICD-10-CM | POA: Diagnosis not present

## 2022-12-09 DIAGNOSIS — Z96652 Presence of left artificial knee joint: Secondary | ICD-10-CM | POA: Diagnosis not present

## 2022-12-09 DIAGNOSIS — Z471 Aftercare following joint replacement surgery: Secondary | ICD-10-CM | POA: Diagnosis not present

## 2022-12-09 DIAGNOSIS — I10 Essential (primary) hypertension: Secondary | ICD-10-CM | POA: Insufficient documentation

## 2022-12-09 DIAGNOSIS — M25462 Effusion, left knee: Secondary | ICD-10-CM | POA: Diagnosis not present

## 2022-12-09 DIAGNOSIS — S638X2A Sprain of other part of left wrist and hand, initial encounter: Secondary | ICD-10-CM | POA: Diagnosis not present

## 2022-12-09 DIAGNOSIS — M51369 Other intervertebral disc degeneration, lumbar region without mention of lumbar back pain or lower extremity pain: Secondary | ICD-10-CM | POA: Diagnosis not present

## 2022-12-09 DIAGNOSIS — Z4789 Encounter for other orthopedic aftercare: Secondary | ICD-10-CM | POA: Diagnosis not present

## 2022-12-09 DIAGNOSIS — S63502A Unspecified sprain of left wrist, initial encounter: Secondary | ICD-10-CM | POA: Diagnosis not present

## 2022-12-09 DIAGNOSIS — M545 Low back pain, unspecified: Secondary | ICD-10-CM | POA: Insufficient documentation

## 2022-12-09 DIAGNOSIS — S6992XA Unspecified injury of left wrist, hand and finger(s), initial encounter: Secondary | ICD-10-CM | POA: Diagnosis not present

## 2022-12-09 DIAGNOSIS — Z79899 Other long term (current) drug therapy: Secondary | ICD-10-CM | POA: Diagnosis not present

## 2022-12-09 DIAGNOSIS — M25532 Pain in left wrist: Secondary | ICD-10-CM | POA: Diagnosis not present

## 2022-12-09 DIAGNOSIS — M549 Dorsalgia, unspecified: Secondary | ICD-10-CM | POA: Diagnosis not present

## 2022-12-09 NOTE — ED Provider Notes (Signed)
[place] EMERGENCY DEPARTMENT AT [facility] Provider Note   CSN: [id number] Arrival date & time: 12/09/22  1130     History  Chief Complaint  Patient presents with   Brent Nguyen is a 57 y.o. male.  Has past history of hypertension, past surgical history of lumbar fusion and left total knee of the prostate.  Presents to the ER today for evaluation of left wrist left knee pain and back pain after fall yesterday he was standing on the second step of a stepstool yesterday, was reaching resulting and fell down, fell primarily on the left side, states his left wrist "bent back".   He denies head injury loss of consciousness, he is not on blood thinners.   He states that his pain is worse in the left wrist, states he tried to swat an insect by clapping his hands together and had severe pain in the left wrist at that time.  He states the pain was even worse this morning and given his prior knee replacement and lumbar fusion he will to make sure everything  with the hardware was okay.    He states he did not take any medication at home because he does not like to take pain medicine.  Is any numbness or tingling, no saddle esthesia or paresthesia, no bowel or bladder incontinence, he is able to ambulate without difficulty.  Fall       Home Medications Prior to Admission medications   Medication Sig Start Date End Date Taking? Authorizing Provider  amLODipine (NORVASC) 10 MG tablet TAKE 1 TABLET BY MOUTH EVERY DAY 01/05/22   Croitoru, Mihai, MD  carvedilol (COREG) 25 MG tablet TAKE 1 TABLET (25 MG TOTAL) BY MOUTH TWICE A DAY WITH MEALS 01/12/22   Croitoru, Mihai, MD  losartan (COZAAR) 25 MG tablet Take 1 tablet (25 mg total) by mouth daily. 02/16/22 02/11/23  Croitoru, Mihai, MD      Allergies    Penicillins    Review of Systems   Review of Systems  Physical Exam Updated Vital Signs BP (!) 132/97 (BP Location: Right Arm)   Pulse 79   Temp 98.1 F (36.7 C)  (Oral)   Resp 16   Ht 6\' 1"  (1.854 m)   Wt 103.9 kg   SpO2 95%   BMI 30.21 kg/m  Physical Exam Vitals and nursing note reviewed.  Constitutional:      General: He is not in acute distress.    Appearance: He is well-developed.  HENT:     Head: Normocephalic and atraumatic.     Mouth/Throat:     Mouth: Mucous membranes are moist.  Eyes:     Extraocular Movements: Extraocular movements intact.     Conjunctiva/sclera: Conjunctivae normal.     Pupils: Pupils are equal, round, and reactive to light.  Cardiovascular:     Rate and Rhythm: Normal rate and regular rhythm.     Heart sounds: No murmur heard. Pulmonary:     Effort: Pulmonary effort is normal. No respiratory distress.     Breath sounds: Normal breath sounds.  Abdominal:     Palpations: Abdomen is soft.     Tenderness: There is no abdominal tenderness.  Musculoskeletal:     Cervical back: Neck supple.     Comments: Left knee has well-healed scar from prior TKA, small effusion noted.  No overlying erythema.  Patient can flex and extend without difficulty and has tenderness over the suprapatellar area.  No  crepitus.  Patient can bear full weight and ambulate.  Left wrist has mild swelling with  tenderness to the dorsum of the wrist diffusely, radial pulse intact, no snuffbox tenderness.    Skin:    General: Skin is warm and dry.     Capillary Refill: Capillary refill takes less than 2 seconds.     Findings: No lesion or rash.  Neurological:     General: No focal deficit present.     Mental Status: He is alert and oriented to person, place, and time.     Motor: No weakness.  Psychiatric:        Mood and Affect: Mood normal.     ED Results / Procedures / Treatments   Labs (all labs ordered are listed, but only abnormal results are displayed) Labs Reviewed - No data to display  EKG None  Radiology DG Wrist Complete Left  Result Date: 12/09/2022 CLINICAL DATA:  Fall with wrist pain. EXAM: LEFT WRIST - COMPLETE  3+ VIEW COMPARISON:  None Available. FINDINGS: There is no evidence of fracture or dislocation. Soft tissues are unremarkable. IMPRESSION: Negative. Electronically Signed   By: Romona Curls M.D.   On: 12/09/2022 13:06   DG Lumbar Spine Complete  Result Date: 12/09/2022 CLINICAL DATA:  Fall with back pain. EXAM: LUMBAR SPINE - COMPLETE 4+ VIEW COMPARISON:  Lumbar spine radiographs dated 05/02/2012 CT abdomen dated 03/07/2016. FINDINGS: The patient is status post instrumented posterior spinal fusion from L4-S1. The hardware appears intact. There is no evidence of lumbar spine fracture. Alignment is normal. Moderate multilevel degenerative disc disease. IMPRESSION: No acute fracture or traumatic malalignment. Electronically Signed   By: Romona Curls M.D.   On: 12/09/2022 13:05   DG Knee Complete 4 Views Left  Result Date: 12/09/2022 CLINICAL DATA:  Status post fall. EXAM: LEFT KNEE - COMPLETE 4+ VIEW COMPARISON:  None Available. FINDINGS: Trace suprapatellar joint effusion. Status post right scratch set status post total knee arthroplasty. No signs of acute fracture or dislocation. Subcutaneous soft tissue edema noted along the superficial soft tissues of the lateral knee. IMPRESSION: 1. No acute fracture or dislocation. 2. Trace suprapatellar joint effusion. 3. Status post total knee arthroplasty. Electronically Signed   By: Signa Kell M.D.   On: 12/09/2022 13:04    Procedures Procedures    Medications Ordered in ED Medications - No data to display  ED Course/ Medical Decision Making/ A&P                                 Medical Decision Making Differential diagnosis: Fracture, sprain, strain, contusion, other  Course: Patient presents for evaluation of low back, left knee and left wrist pain after mechanical fall from a stepstool yesterday.  He had no head injury or loss conscious.  He is not having any numbness tingling or weakness, no saddle anesthesia or paresthesia, no bowel or  bladder incontinence.  Patient states he wanted to make sure his hardware is okay and was worried about a possible fracture of the left wrist.  X-rays ordered.  I viewed and interpreted x-rays within the scope of determining acute emergent findings: X-ray left wrist-no fracture or traumatic malalignment X-ray lumbar spine, prior fusion, hardware intact, no traumatic fracture or malalignment X-ray left knee: Patient's status post total knee arthroplasty, no fracture or malalignment  Agree with radiology reading of above  Amount and/or Complexity of Data Reviewed Radiology: ordered.  Final Clinical Impression(s) / ED Diagnoses Final diagnoses:  Wrist sprain, left, initial encounter  Acute pain of left knee  Acute bilateral low back pain without sciatica    Rx / DC Orders ED Discharge Orders     None         Ma Rings, PA-C 12/09/22 1447    Vanetta Mulders, MD 12/10/22 1143

## 2022-12-09 NOTE — ED Notes (Signed)
Patient transported to X-ray 

## 2022-12-09 NOTE — Discharge Instructions (Signed)
Seen in the emergency room today for evaluation of left wrist, left knee and low back pain after a fall.  Your x-rays were all reassuring.  I have a sprain of your wrist and may have some ligamentous injury to your knee.  Follow-up with your orthopedic doctor, you can take over-the-counter medication as needed for pain.  Come back to the ER if you have any new or worsening symptoms.

## 2022-12-09 NOTE — ED Triage Notes (Signed)
Wednesday feel on top of shop vac Was on 2 step step stool  LEFT wrist folded backwards Step stool landed on LEFT knee Back pain around L4-L5, fusion at L5-S1

## 2022-12-13 ENCOUNTER — Encounter: Payer: Self-pay | Admitting: Orthopaedic Surgery

## 2022-12-13 ENCOUNTER — Ambulatory Visit (INDEPENDENT_AMBULATORY_CARE_PROVIDER_SITE_OTHER): Payer: 59 | Admitting: Orthopaedic Surgery

## 2022-12-13 VITALS — Ht 73.0 in | Wt 226.0 lb

## 2022-12-13 DIAGNOSIS — Z981 Arthrodesis status: Secondary | ICD-10-CM | POA: Diagnosis not present

## 2022-12-13 DIAGNOSIS — S63502A Unspecified sprain of left wrist, initial encounter: Secondary | ICD-10-CM | POA: Diagnosis not present

## 2022-12-13 DIAGNOSIS — Z96652 Presence of left artificial knee joint: Secondary | ICD-10-CM

## 2022-12-13 NOTE — Progress Notes (Unsigned)
Office Visit Note   Patient: Brent Nguyen           Date of Birth: Mar 29, 1965           MRN: [id number] Visit Date: 12/13/2022              Requested by: Pearson Grippe, MD [address] [place],  [place] [address] PCP: Pearson Grippe, MD   Assessment & Plan: Visit Diagnoses:  1. S/P total knee arthroplasty, left   2. History of lumbar fusion   3. Sprain of left wrist, initial encounter     Plan: Fall with knee contusion.  X-rays reviewed negative.  He appears to had lumbar fusion without interbody space or.  He did have iliac crest bone no loosening of the screws.  Neurologically intact.  He can gradually work his way out of the wrist splint.  X-rays of his knee show satisfactory total knee arthroplasty.  Follow-Up Instructions: Return if symptoms worsen or fail to improve.   Orders:  No orders of the defined types were placed in this encounter.  No orders of the defined types were placed in this encounter.     Procedures: No procedures performed   Clinical Data: No additional findings.   Subjective: Chief Complaint  Patient presents with   Left Knee - Pain   Lower Back - Pain   Left Wrist - Pain    HPI 57 year old male fell off a stepstool when stretching out too far landing on the left side.  Seen at [facility], ED had x-rays.  He was standing on the fender of a car stepping off on the stool reaching shelf slipped.  He hit his left knee on the stepstool hurt his wrist x-rays in his wrist were negative.  Previous lumbar fusion done in [place].  Yesterday patient left the mailbox slipped fell backwards hit his head and back.  Previous left total knee arthroplasty 04/04/2021.  He has had some pain anteriorly over the tibial tubercle.  Review of Systems all other systems noncontributory to HPI.   Objective: Vital Signs: Ht 6\' 1"  (1.854 m)   Wt 226 lb (102.5 kg)   BMI 29.82 kg/m   Physical Exam Constitutional:      Appearance: He is well-developed.   HENT:     Head: Normocephalic and atraumatic.     Right Ear: External ear normal.     Left Ear: External ear normal.  Eyes:     Pupils: Pupils are equal, round, and reactive to light.  Neck:     Thyroid: No thyromegaly.     Trachea: No tracheal deviation.  Cardiovascular:     Rate and Rhythm: Normal rate.  Pulmonary:     Effort: Pulmonary effort is normal.     Breath sounds: No wheezing.  Abdominal:     General: Bowel sounds are normal.     Palpations: Abdomen is soft.  Musculoskeletal:     Cervical back: Neck supple.  Skin:    General: Skin is warm and dry.     Capillary Refill: Capillary refill takes less than 2 seconds.  Neurological:     Mental Status: He is alert and oriented to person, place, and time.  Psychiatric:        Behavior: Behavior normal.        Thought Content: Thought content normal.        Judgment: Judgment normal.     Ortho Exam some tenderness anterior over the tibial tubercle and  inferior pole of the patella with slight thickening prepatellar bursa.  No knee effusion collateral ligaments are stable.  No AP instability of the knee.  Negative logroll hips.  No snuffbox tenderness to his wrist.  Patient's been immobilized in a small wrist splint.  Negative straight leg raising 90 degrees negative popliteal compression test.  Specialty Comments:  No specialty comments available.  Imaging: No results found.   PMFS History: Patient Active Problem List   Diagnosis Date Noted   History of lumbar fusion 12/13/2022   Left wrist sprain 12/13/2022   Quadriceps weakness 09/01/2021   S/P total knee arthroplasty, left 04/21/2021   Chest pain 05/17/2013   Palpitations 05/17/2013   Hyperlipidemia 04/25/2013   Diverticulosis s/p partial colectomy with reversal    HTN (hypertension), severe 09/17/2012   Tobacco abuse 09/17/2012   Obesity (BMI 30-39.9) 09/17/2012   Past Medical History:  Diagnosis Date   Chronic back pain    Diverticulosis    a. s/p  partial colectomy 2005 for anastamotic leak with reversal.   History of kidney stones    Hyperlipidemia    Hypertension    Normal cardiac stress test 2015   low risk   Obesity    Sciatica, left side    Tobacco abuse     Family History  Problem Relation Age of Onset   Hypertension Mother    Diverticulosis Mother    Sleep apnea Mother    Celiac disease Mother    Ulcers Mother    Cancer Father        Brain   Diverticulosis Brother    Ulcers Brother    Celiac disease Brother    Hypotension Brother    Cancer Maternal Grandmother        Breast   Ulcers Maternal Grandfather    ALS Maternal Grandfather    Diabetes Maternal Grandfather    Heart attack Paternal Grandmother    Hypertension Paternal Grandmother    Cancer Paternal Grandfather        Melanoma   Diverticulosis Daughter     Past Surgical History:  Procedure Laterality Date   BACK SURGERY  2021   "two-level instrumented fusion without interbody cages" in [place]   COLOSTOMY REVERSAL     KNEE ARTHROSCOPY Right    KNEE ARTHROSCOPY Right 01/20/2013   Procedure: ARTHROSCOPY KNEE;  Surgeon: Darreld Mclean, MD;  Location: [facility];  Service: Orthopedics;  Laterality: Right;   MENISECTOMY Right 01/20/2013   Procedure: partial medial menisectomy;  Surgeon: Darreld Mclean, MD;  Location: [facility];  Service: Orthopedics;  Laterality: Right;   PARTIAL COLECTOMY     2 subsequent surgeries to reverse his colostomy-diverticulosis   TOTAL KNEE ARTHROPLASTY Left 04/04/2021   Procedure: Left TOTAL KNEE ARTHROPLASTY;  Surgeon: [name], MD;  Location: [facility];  Service: Orthopedics;  Laterality: Left;   WISDOM TOOTH EXTRACTION     Social History   Occupational History   Not on file  Tobacco Use   Smoking status: Every Day    Current packs/day: 1.00    Average packs/day: 1 pack/day for 25.0 years (25.0 ttl pk-yrs)    Types: Cigarettes   Smokeless tobacco: Former  Building services engineer status: Some Days   Substances: THC   Substance and Sexual Activity   Alcohol use: Yes    Comment: On occasion, 1 or 2 beers per month   Drug use: No   Sexual activity: Yes    Birth control/protection: None

## 2022-12-23 ENCOUNTER — Other Ambulatory Visit: Payer: Self-pay | Admitting: Cardiovascular Disease

## 2023-01-21 ENCOUNTER — Other Ambulatory Visit: Payer: Self-pay | Admitting: Cardiovascular Disease

## 2023-01-22 ENCOUNTER — Other Ambulatory Visit: Payer: Self-pay | Admitting: Cardiovascular Disease

## 2023-01-26 NOTE — Progress Notes (Unsigned)
Cardiology Office Note    Date:  01/29/2023   ID:  Brent Nguyen, DOB [birth year], MRN [id number]   PCP:  Pearson Grippe, MD   [facility]  Cardiologist:  Thurmon Fair, MD  New to me  [phone]}   History of Present Illness:  Brent Nguyen is a 57 y.o. male with history of HTN, HLD, smoker, obestiy.   Last saw Dr. Royann Shivers 10/09/2019 and having trouble with back pain.  Patient can no longer travel to [place] for appt so will be followed in [place].  He is new to me 01/27/22   Patient comes in for f/u. Had left knee replacement in March 2023 and [place] having a lot of trouble with it.  Seen by Ophelia Charter 09/01/21 describes swelling and quad weakness BP improved  He eats out a lot mostly fried foods. Smoking 1 ppd. Quit drinking daliy.  He is now on disability use to work at [employer] and [employer] Did Naval architect work Hurt his left knee and back at work and has had back surgery in [place] and left TKR with prior arthroscopic surgery on right knee  Has had a lot of steroid injections for pain. 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 s/p partial colectomy 2005 for anastamotic leak with reversal.   History of kidney stones    Hyperlipidemia    Hypertension    Normal cardiac stress test 2015   low risk   Obesity    Sciatica, left side    Tobacco abuse     Past Surgical History:  Procedure Laterality Date   BACK SURGERY  2021   "two-level instrumented fusion without interbody cages" in [place]   COLOSTOMY REVERSAL     KNEE  ARTHROSCOPY Right    KNEE ARTHROSCOPY Right 01/20/2013   Procedure: ARTHROSCOPY KNEE;  Surgeon: Darreld Mclean, MD;  Location: [facility];  Service: Orthopedics;  Laterality: Right;   MENISECTOMY Right 01/20/2013   Procedure: partial medial menisectomy;  Surgeon: Darreld Mclean, MD;  Location: [facility];  Service: Orthopedics;  Laterality: Right;   PARTIAL COLECTOMY     2 subsequent surgeries to reverse his colostomy-diverticulosis   TOTAL KNEE ARTHROPLASTY Left 04/04/2021   Procedure: Left TOTAL KNEE ARTHROPLASTY;  Surgeon: [name], MD;  Location: [facility];  Service: Orthopedics;  Laterality: Left;   WISDOM TOOTH EXTRACTION      Current Medications: Current Meds  Medication Sig   amLODipine (NORVASC) 10 MG tablet TAKE 1 TABLET BY MOUTH EVERY DAY   carvedilol (COREG) 25 MG tablet TAKE 1 TABLET (25 MG TOTAL) BY MOUTH TWICE A DAY WITH MEALS   losartan (COZAAR) 25 MG tablet Take 1 tablet (25 mg total) by mouth daily. Please call [phone] to schedule an appointment for future refills. Thank you.     Allergies:   Penicillins   Social History   Socioeconomic History   Marital status: Married    Spouse name: Not on file   Number of  children: Not on file   Years of education: Not on file   Highest education level: Not on file  Occupational History   Not on file  Tobacco Use   Smoking status: Every Day    Current packs/day: 1.00    Average packs/day: 1 pack/day for 25.0 years (25.0 ttl pk-yrs)    Types: Cigarettes   Smokeless tobacco: Former  Building services engineer status: Some Days   Substances: THC  Substance and Sexual Activity   Alcohol use: Yes    Comment: On occasion, 1 or 2 beers per month   Drug use: No   Sexual activity: Yes    Birth control/protection: None  Other Topics Concern   Not on file  Social History Narrative   Not on file   Social Drivers of Health   Financial Resource Strain: Low Risk  (02/20/2020)   Overall Financial Resource Strain (CARDIA)    Difficulty  of Paying Living Expenses: Not hard at all  Food Insecurity: No Food Insecurity (02/20/2020)   Hunger Vital Sign    Worried About Running Out of Food in the Last Year: Never true    Ran Out of Food in the Last Year: Never true  Transportation Needs: No Transportation Needs (02/20/2020)   PRAPARE - Administrator, Civil Service (Medical): No    Lack of Transportation (Non-Medical): No  Physical Activity: Sufficiently Active (02/20/2020)   Exercise Vital Sign    Days of Exercise per Week: 3 days    Minutes of Exercise per Session: 60 min  Stress: No Stress Concern Present (02/20/2020)   [employer] of Occupational Health - Occupational Stress Questionnaire    Feeling of Stress : Not at all  Social Connections: Unknown (06/14/2021)   Received from [facility]   Social Network    Social Network: Not on file     Family History:  The patient's  family history includes ALS in his maternal grandfather; Cancer in his father, maternal grandmother, and paternal grandfather; Celiac disease in his brother and mother; Diabetes in his maternal grandfather; Diverticulosis in his brother, daughter, and mother; Heart attack in his paternal grandmother; Hypertension in his mother and paternal grandmother; Hypotension in his brother; Sleep apnea in his mother; Ulcers in his brother, maternal grandfather, and mother.   ROS:   Please see the history of present illness.    ROS All other systems reviewed and are negative.   PHYSICAL EXAM:   VS:  Ht 6\' 1"  (1.854 m)   Wt 233 lb 12.8 oz (106.1 kg)   SpO2 95%   BMI 30.85 kg/m   Physical Exam   Affect appropriate Healthy:  appears stated age HEENT: normal Neck supple with no adenopathy JVP normal no bruits no thyromegaly Lungs clear with no wheezing and good diaphragmatic motion Heart:  S1/S2 no murmur, no rub, gallop or click PMI normal Abdomen: benighn, BS positve, no tenderness, no AAA no bruit.  No HSM or HJR Distal  pulses intact with no bruits No edema Neuro non-focal Post left TKR  Post lumbar back surgery   Wt Readings from Last 3 Encounters:  01/29/23 233 lb 12.8 oz (106.1 kg)  12/13/22 226 lb (102.5 kg)  12/09/22 229 lb (103.9 kg)      Studies/Labs Reviewed:   EKG:  01/29/2023 NSR rate 74 normal   Recent Labs: No results found for requested labs within last 365 days.   Lipid Panel    Component Value  Date/Time   CHOL 174 10/08/2019 1512   TRIG 74 10/08/2019 1512   HDL 43 10/08/2019 1512   CHOLHDL 4.0 10/08/2019 1512   LDLCALC 117 (H) 10/08/2019 1512    Additional studies/ records that were reviewed today include:    Echo 2018 Study Conclusions   - Left ventricle: The cavity size was normal. Wall thickness was    increased in a pattern of moderate LVH. Systolic function was    normal. The estimated ejection fraction was in the range of 60%    to 65%. Wall motion was normal; there were no regional wall    motion abnormalities. Doppler parameters are consistent with    abnormal left ventricular relaxation (grade 1 diastolic    dysfunction). The E/e&' ratio is between 8-15, suggesting    indeterminate LV filling pressure.  - Aorta: Dilated aortic root and ascending aorta. Aortic root    dimension: 41 mm (ED). Ascending aortic diameter: 40 mm (S).  - Mitral valve: Mildly thickened leaflets . There was trivial    regurgitation.  - Left atrium: The atrium was normal in size.  - Inferior vena cava: The vessel was normal in size. The    respirophasic diameter changes were in the normal range (>= 50%),    consistent with normal central venous pressure.   Impressions:   - LVEF 60-65, moderate LVH, normal wall motion, grade 1 DD with    indeterminate LV filling pressure, dilated aortic root and    ascending aorta, trivial MR, normal LA size, normal IVC.   -------------------------------------------------------------------  Study data:   Study status:  Routine.  Procedure:  The  patient  reported no pain pre or post test. Transthoracic echocardiography.  Image quality was adequate.  Study completion:  There were no  complications.          Transthoracic echocardiography.  M-mode,  complete 2D, spectral Doppler, and color Doppler.  Birthdate:  Patient birthdate: Apr 11, 1965.  Age:  Patient is 57 yr old.  Sex:  Gender: male.    BMI: 32.1 kg/m^2.  Blood pressure:     140/88  Patient status:  Outpatient.  Study date:  Study date: 05/11/2016.  Study time: 03:50 PM.  Location:  [facility]   -------------------------------------------------------------------   -------------------------------------------------------------------  Left ventricle:  The cavity size was normal. Wall thickness was  increased in a pattern of moderate LVH. Systolic function was  normal. The estimated ejection fraction was in the range of 60% to  65%. Wall motion was normal; there were no regional wall motion  abnormalities. Doppler parameters are consistent with abnormal left  ventricular relaxation (grade 1 diastolic dysfunction). The E/e&'  ratio is between 8-15, suggesting indeterminate LV filling  pressure.   -------------------------------------------------------------------  Aortic valve:   Structurally normal valve. Trileaflet. Cusp  separation was normal.  Doppler:  Transvalvular velocity was within  the normal range. There was no stenosis. There was no  regurgitation.   -------------------------------------------------------------------  Aorta: Dilated aortic root and ascending aorta.   -------------------------------------------------------------------  Mitral valve:   Mildly thickened leaflets .  Doppler:  There was  trivial regurgitation.    Peak gradient (D): 2 mm Hg.   -------------------------------------------------------------------  Left atrium:  The atrium was normal in size.   -------------------------------------------------------------------  Atrial septum:   No defect or patent foramen ovale was identified.    -------------------------------------------------------------------  Right ventricle:  The cavity size was normal. Wall thickness was  normal. Systolic function was normal.   -------------------------------------------------------------------  Pulmonic valve:    The valve appears to be grossly normal.  Doppler:  There was no significant regurgitation.   -------------------------------------------------------------------  Tricuspid valve:   Doppler:  There was no significant  regurgitation.   -------------------------------------------------------------------  Pulmonary artery:   The main pulmonary artery was normal-sized.   -------------------------------------------------------------------  Right atrium:  The atrium was normal in size.   -------------------------------------------------------------------  Pericardium: There was no pericardial effusion.   -------------------------------------------------------------------  Systemic veins:  Inferior vena cava: The vessel was normal in size. The  respirophasic diameter changes were in the normal range (>= 50%),  consistent with normal central venous pressure.   -------------------------------------------------------------------  Post procedure conclusions  Ascending Aorta:   - Dilated aortic root and ascending aorta.   -------------------------------------------------------------------  Measurements   PLAN:  In order of problems listed above:  HTN-BP better on losartan Keefe needs work on diet and salt intake    HLD-needs FLP not on meds Has not seen primary in a while will get labs   Tobacco abuse-smoking cessation discussed. Has smoked 40 yrs. Check screening CT. Assess coronary calcium at this time as well   Bruising:  encouraged him to f/u with primary and get lab testing PLT normal 04/06/21 and no untoward Bleeding during back and knee surgery   Shared Decision  Making/Informed Consent      Lung cancer screening CT CBC/PLT/CMET/Lipid s    F/U  in a year   Medication Adjustments/Labs and Tests Ordered: Current medicines are reviewed at length with the patient today.  Concerns regarding medicines are outlined above.  Medication changes, Labs and Tests ordered today are listed in the Patient Instructions below. There are no Patient Instructions on file for this visit.    Signed, Charlton Haws, MD  01/29/2023 1:17 PM    [facility] [address] [place], [place], [place]  [address] Phone: [phone]; Fax: [phone]

## 2023-01-29 ENCOUNTER — Encounter: Payer: Self-pay | Admitting: Cardiovascular Disease

## 2023-01-29 ENCOUNTER — Ambulatory Visit: Payer: 59 | Attending: Cardiovascular Disease | Admitting: Cardiovascular Disease

## 2023-01-29 VITALS — BP 130/88 | HR 74 | Ht 73.0 in | Wt 233.8 lb

## 2023-01-29 DIAGNOSIS — I10 Essential (primary) hypertension: Secondary | ICD-10-CM

## 2023-01-29 DIAGNOSIS — F17200 Nicotine dependence, unspecified, uncomplicated: Secondary | ICD-10-CM

## 2023-01-29 NOTE — Patient Instructions (Signed)
Medication Instructions:  Your physician recommends that you continue on your current medications as directed. Please refer to the Current Medication list given to you today.  *If you need a refill on your cardiac medications before your next appointment, please call your pharmacy*   Lab Work: Your physician recommends that you return for lab work in: today   If you have labs (blood work) drawn today and your tests are completely normal, you will receive your results only by: MyChart Message (if you have MyChart) OR A paper copy in the mail If you have any lab test that is abnormal or we need to change your treatment, we will call you to review the results.   Testing/Procedures:  Lung Cancer Screening CT    Follow-Up: At [facility], you and your health needs are our priority.  As part of our continuing mission to provide you with exceptional heart care, we have created designated Provider Care Teams.  These Care Teams include your primary Cardiologist (physician) and Advanced Practice Providers (APPs -  Physician Assistants and Nurse Practitioners) who all work together to provide you with the care you need, when you need it.  We recommend signing up for the patient portal called "MyChart".  Sign up information is provided on this After Visit Summary.  MyChart is used to connect with patients for Virtual Visits (Telemedicine).  Patients are able to view lab/test results, encounter notes, upcoming appointments, etc.  Non-urgent messages can be sent to your provider as well.   To learn more about what you can do with MyChart, go to [email or web address].    Your next appointment:   1 year(s)  Provider:   Charlton Haws, MD    Other Instructions Thank you for choosing [facility]!

## 2023-02-09 ENCOUNTER — Telehealth: Payer: Self-pay | Admitting: Cardiovascular Disease

## 2023-02-09 NOTE — Telephone Encounter (Unsigned)
*  STAT* If patient is at the pharmacy, call can be transferred to refill team.   1. Which medications need to be refilled? (please list name of each medication and dose if known) amLODipine  (NORVASC ) 10 MG tablet   2. Which pharmacy/location (including street and city if local pharmacy) is medication to be sent to  [id number] - [place], [place] - [address]    3. Do they need a 30 day or 90 day supply? 90

## 2023-02-10 ENCOUNTER — Other Ambulatory Visit: Payer: Self-pay | Admitting: Cardiovascular Disease

## 2023-02-12 ENCOUNTER — Other Ambulatory Visit: Payer: Self-pay

## 2023-02-12 MED ORDER — AMLODIPINE BESYLATE 10 MG PO TABS: 10.0000 mg | ORAL_TABLET | Freq: Every day | ORAL | 6 refills | Status: DC

## 2023-02-14 ENCOUNTER — Other Ambulatory Visit (HOSPITAL_COMMUNITY)
Admission: RE | Admit: 2023-02-14 | Discharge: 2023-02-14 | Disposition: A | Discharge status: Home / Self Care | Payer: Medicare Other | Source: Ambulatory Visit | Attending: Cardiovascular Disease | Admitting: Cardiovascular Disease

## 2023-02-14 ENCOUNTER — Encounter: Payer: Self-pay | Admitting: Orthopaedic Surgery

## 2023-02-14 ENCOUNTER — Ambulatory Visit (INDEPENDENT_AMBULATORY_CARE_PROVIDER_SITE_OTHER): Payer: Medicare Other | Admitting: Orthopaedic Surgery

## 2023-02-14 VITALS — Ht 73.0 in | Wt 233.0 lb

## 2023-02-14 DIAGNOSIS — I10 Essential (primary) hypertension: Secondary | ICD-10-CM | POA: Insufficient documentation

## 2023-02-14 DIAGNOSIS — M461 Sacroiliitis, not elsewhere classified: Secondary | ICD-10-CM

## 2023-02-14 DIAGNOSIS — Z981 Arthrodesis status: Secondary | ICD-10-CM

## 2023-02-14 LAB — CBC
HCT: 49.2 % (ref 39.0–52.0)
Hemoglobin: 16.4 g/dL (ref 13.0–17.0)
MCH: 29.8 pg (ref 26.0–34.0)
MCHC: 33.3 g/dL (ref 30.0–36.0)
MCV: 89.5 fL (ref 80.0–100.0)
Platelets: 269 10*3/uL (ref 150–400)
RBC: 5.5 MIL/uL (ref 4.22–5.81)
RDW: 13 % (ref 11.5–15.5)
WBC: 7.1 10*3/uL (ref 4.0–10.5)
nRBC: 0 % (ref 0.0–0.2)

## 2023-02-14 LAB — COMPREHENSIVE METABOLIC PANEL
ALT: 21 U/L (ref 0–44)
AST: 22 U/L (ref 15–41)
Albumin: 4.2 g/dL (ref 3.5–5.0)
Alkaline Phosphatase: 65 U/L (ref 38–126)
Anion gap: 7 (ref 5–15)
BUN: 13 mg/dL (ref 6–20)
CO2: 29 mmol/L (ref 22–32)
Calcium: 9.3 mg/dL (ref 8.9–10.3)
Chloride: 101 mmol/L (ref 98–111)
Creatinine, Ser: 0.77 mg/dL (ref 0.61–1.24)
GFR, Estimated: >60 mL/min (ref >60)
Glucose, Bld: 98 mg/dL (ref 70–99)
Potassium: 4.3 mmol/L (ref 3.5–5.1)
Sodium: 137 mmol/L (ref 135–145)
Total Bilirubin: 1.1 mg/dL (ref 0.0–1.2)
Total Protein: 7.9 g/dL (ref 6.5–8.1)

## 2023-02-14 LAB — LIPID PANEL
Cholesterol: 170 mg/dL (ref 0–200)
HDL: 37 mg/dL — ABNORMAL LOW (ref >40)
LDL Cholesterol: 123 mg/dL — ABNORMAL HIGH (ref 0–99)
Total CHOL/HDL Ratio: 4.6 {ratio}
Triglycerides: 48 mg/dL (ref <150)
VLDL: 10 mg/dL (ref 0–40)

## 2023-02-14 MED ORDER — LIDOCAINE HCL 1 % IJ SOLN
1.0000 mL | INTRAMUSCULAR | Status: AC | PRN
  Administered 2023-02-14: 1 mL

## 2023-02-14 MED ORDER — BUPIVACAINE HCL 0.25 % IJ SOLN
1.0000 mL | INTRAMUSCULAR | Status: AC | PRN
  Administered 2023-02-14: 1 mL via INTRA_ARTICULAR

## 2023-02-14 MED ORDER — METHYLPREDNISOLONE ACETATE 40 MG/ML IJ SUSP
40.0000 mg | INTRAMUSCULAR | Status: AC | PRN
  Administered 2023-02-14: 40 mg via INTRA_ARTICULAR

## 2023-02-14 NOTE — Progress Notes (Signed)
 Office Visit Note   Patient: Brent Nguyen           Date of Birth: 04-23-1965           MRN: [id number] Visit Date: 02/14/2023              Requested by: Vanita Gens, MD [address] [place],  [place] [address] PCP: Vanita Gens, MD   Assessment & Plan: Visit Diagnoses:  1. History of lumbar fusion   2. Sacroiliitis, not elsewhere classified (HCC)     Plan: Right sacroiliac injection performed.  He has ongoing symptoms we can consider therapy.  Ultimately he may require imaging studies and likely has some adjacent level progression at L3-4 with associated pain referred to his right SI joint region.  Follow-Up Instructions: No follow-ups on file.   Orders:  No orders of the defined types were placed in this encounter.  No orders of the defined types were placed in this encounter.     Procedures: Large Joint Inj on 02/14/2023 9:55 AM Details: posterior approach Medications: 1 mL lidocaine  1 %; 1 mL bupivacaine  0.25 %; 40 mg methylPREDNISolone  acetate 40 MG/ML   Sacroiliac right injection   Clinical Data: No additional findings.   Subjective: Chief Complaint  Patient presents with   Lower Back - Pain    HPI 58 year old male returns had previous decompression fusion L4-5 L5-S1 without interbody cage but bilateral lateral fusion done in [place].  When he fell in the fall he had back pain he states recently it has been excruciating is unable to handle the pain.  Total knee arthroplasty originally bothering him.  When he was standing up elevated and fell off a stepstool.  Pains exactly over his right SI joint he said previous right SI injections in the past which gave him good relief.  No bowel or bladder symptoms.  Plain radiographs 2 months ago showed endplate spurring and disc space narrowing at L2-3 and L3-4 with facet arthropathy.  No loosening of hardware.  Review of Systems   Objective: Vital Signs: Ht 6\' 1"  (1.854 m)   Wt 233 lb (105.7 kg)   BMI  30.74 kg/m   Physical Exam Constitutional:      Appearance: He is well-developed.  HENT:     Head: Normocephalic and atraumatic.     Right Ear: External ear normal.     Left Ear: External ear normal.  Eyes:     Pupils: Pupils are equal, round, and reactive to light.  Neck:     Thyroid: No thyromegaly.     Trachea: No tracheal deviation.  Cardiovascular:     Rate and Rhythm: Normal rate.  Pulmonary:     Effort: Pulmonary effort is normal.     Breath sounds: No wheezing.  Abdominal:     General: Bowel sounds are normal.     Palpations: Abdomen is soft.  Musculoskeletal:     Cervical back: Neck supple.  Skin:    General: Skin is warm and dry.     Capillary Refill: Capillary refill takes less than 2 seconds.  Neurological:     Mental Status: He is alert and oriented to person, place, and time.  Psychiatric:        Behavior: Behavior normal.        Thought Content: Thought content normal.        Judgment: Judgment normal.     Ortho Exam tenderness over the right SI joint negative logroll negative FABER.  Knee reaches full extension sensory lower extremities intact.  Specialty Comments:  No specialty comments available.  Imaging: No results found.   PMFS History: Patient Active Problem List   Diagnosis Date Noted   Sacroiliitis, not elsewhere classified (HCC) 02/14/2023   History of lumbar fusion 12/13/2022   Left wrist sprain 12/13/2022   Quadriceps weakness 09/01/2021   S/P total knee arthroplasty, left 04/21/2021   Chest pain 05/17/2013   Palpitations 05/17/2013   Hyperlipidemia 04/25/2013   Diverticulosis s/p partial colectomy with reversal    HTN (hypertension), severe 09/17/2012   Tobacco abuse 09/17/2012   Obesity (BMI 30-39.9) 09/17/2012   Past Medical History:  Diagnosis Date   Chronic back pain    Diverticulosis    a. s/p partial colectomy 2005 for anastamotic leak with reversal.   History of kidney stones    Hyperlipidemia    Hypertension     Normal cardiac stress test 2015   low risk   Obesity    Sciatica, left side    Tobacco abuse     Family History  Problem Relation Age of Onset   Hypertension Mother    Diverticulosis Mother    Sleep apnea Mother    Celiac disease Mother    Ulcers Mother    Cancer Father        Brain   Diverticulosis Brother    Ulcers Brother    Celiac disease Brother    Hypotension Brother    Cancer Maternal Grandmother        Breast   Ulcers Maternal Grandfather    ALS Maternal Grandfather    Diabetes Maternal Grandfather    Heart attack Paternal Grandmother    Hypertension Paternal Grandmother    Cancer Paternal Grandfather        Melanoma   Diverticulosis Daughter     Past Surgical History:  Procedure Laterality Date   BACK SURGERY  2021   "two-level instrumented fusion without interbody cages" in [place]    COLOSTOMY REVERSAL     KNEE ARTHROSCOPY Right    KNEE ARTHROSCOPY Right 01/20/2013   Procedure: ARTHROSCOPY KNEE;  Surgeon: Pleasant Brilliant, MD;  Location: [facility];  Service: Orthopedics;  Laterality: Right;   MENISECTOMY Right 01/20/2013   Procedure: partial medial menisectomy;  Surgeon: Pleasant Brilliant, MD;  Location: [facility];  Service: Orthopedics;  Laterality: Right;   PARTIAL COLECTOMY     2 subsequent surgeries to reverse his colostomy-diverticulosis   TOTAL KNEE ARTHROPLASTY Left 04/04/2021   Procedure: Left TOTAL KNEE ARTHROPLASTY;  Surgeon: Adah Acron, MD;  Location: [facility];  Service: Orthopedics;  Laterality: Left;   WISDOM TOOTH EXTRACTION     Social History   Occupational History   Not on file  Tobacco Use   Smoking status: Every Day    Current packs/day: 1.00    Average packs/day: 1 pack/day for 25.0 years (25.0 ttl pk-yrs)    Types: Cigarettes   Smokeless tobacco: Former  Building services engineer status: Some Days   Substances: THC  Substance and Sexual Activity   Alcohol use: Yes    Comment: On occasion, 1 or 2 beers per month   Drug use: No   Sexual activity:  Yes    Birth control/protection: None

## 2023-02-17 ENCOUNTER — Other Ambulatory Visit: Payer: Self-pay | Admitting: Cardiovascular Disease

## 2023-02-17 ENCOUNTER — Ambulatory Visit (HOSPITAL_COMMUNITY)
Admission: RE | Admit: 2023-02-17 | Discharge: 2023-02-17 | Disposition: A | Discharge status: Home / Self Care | Payer: Medicare Other | Source: Ambulatory Visit | Attending: Cardiovascular Disease | Admitting: Cardiovascular Disease

## 2023-02-17 DIAGNOSIS — F17210 Nicotine dependence, cigarettes, uncomplicated: Secondary | ICD-10-CM | POA: Diagnosis not present

## 2023-02-17 DIAGNOSIS — Z122 Encounter for screening for malignant neoplasm of respiratory organs: Secondary | ICD-10-CM | POA: Diagnosis not present

## 2023-02-17 DIAGNOSIS — J439 Emphysema, unspecified: Secondary | ICD-10-CM | POA: Insufficient documentation

## 2023-02-17 DIAGNOSIS — I2510 Atherosclerotic heart disease of native coronary artery without angina pectoris: Secondary | ICD-10-CM | POA: Diagnosis not present

## 2023-02-17 DIAGNOSIS — F17200 Nicotine dependence, unspecified, uncomplicated: Secondary | ICD-10-CM

## 2023-02-17 DIAGNOSIS — I700 Atherosclerosis of aorta: Secondary | ICD-10-CM | POA: Insufficient documentation

## 2023-02-17 DIAGNOSIS — I7121 Aneurysm of the ascending aorta, without rupture: Secondary | ICD-10-CM | POA: Diagnosis not present

## 2023-02-18 ENCOUNTER — Other Ambulatory Visit: Payer: Self-pay | Admitting: Cardiovascular Disease

## 2023-06-25 IMAGING — DX DG KNEE 1-2V*L*
2 series · 2 of 2 positions shown · non-contrast
Comparison: None.

CLINICAL DATA: Left knee replacement.

EXAM:
LEFT KNEE - 1-2 VIEW

[knee ap]
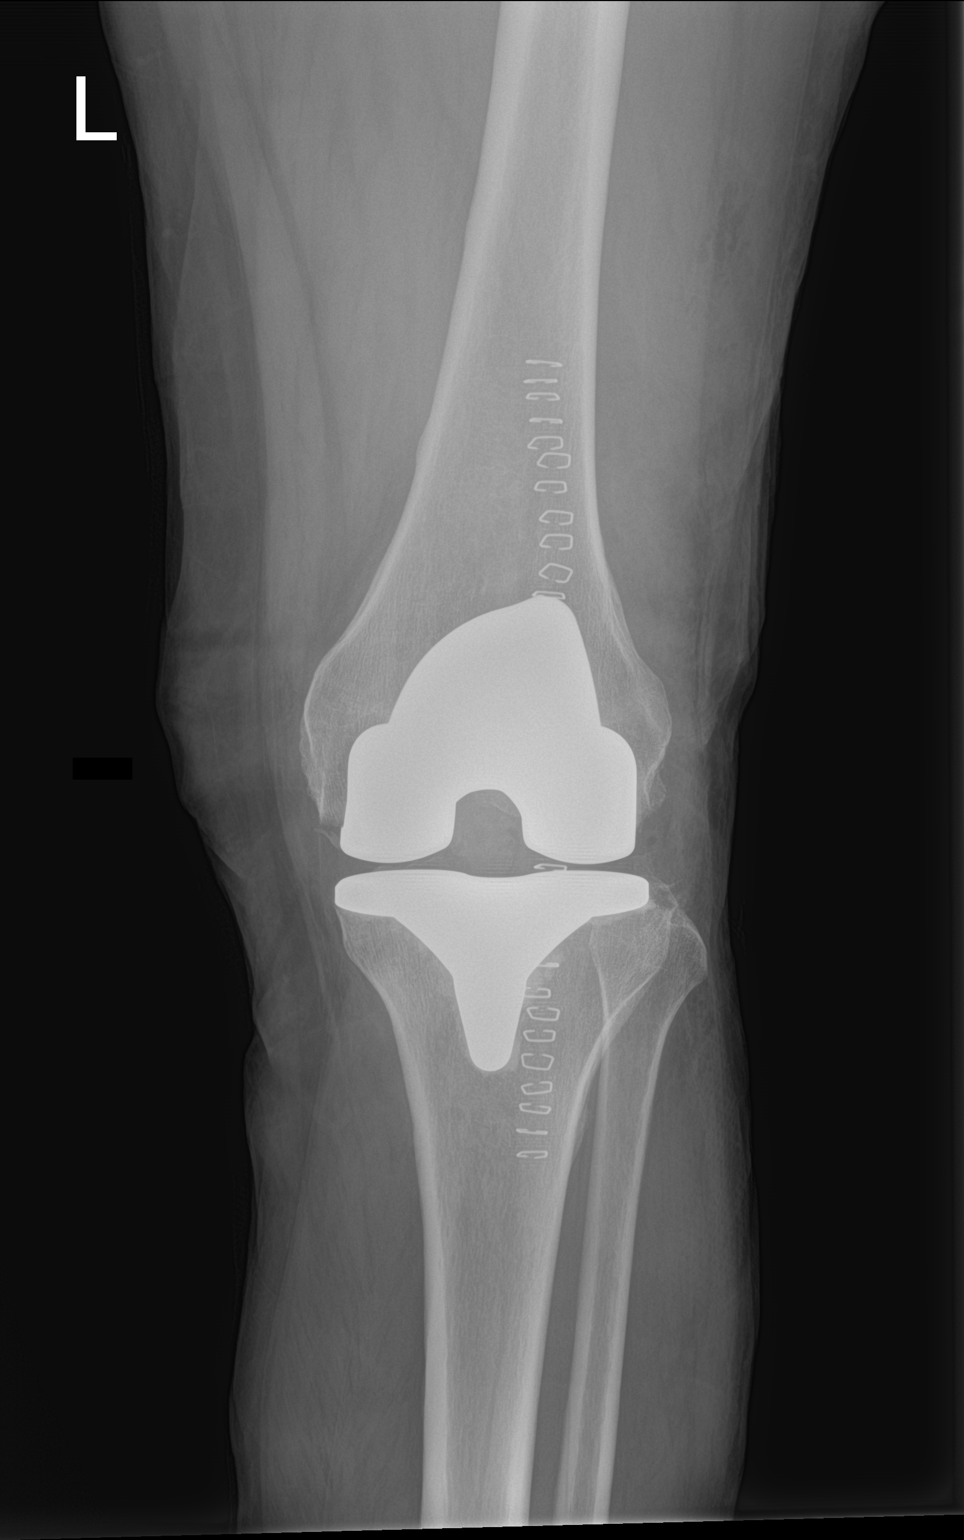

[knee lat]
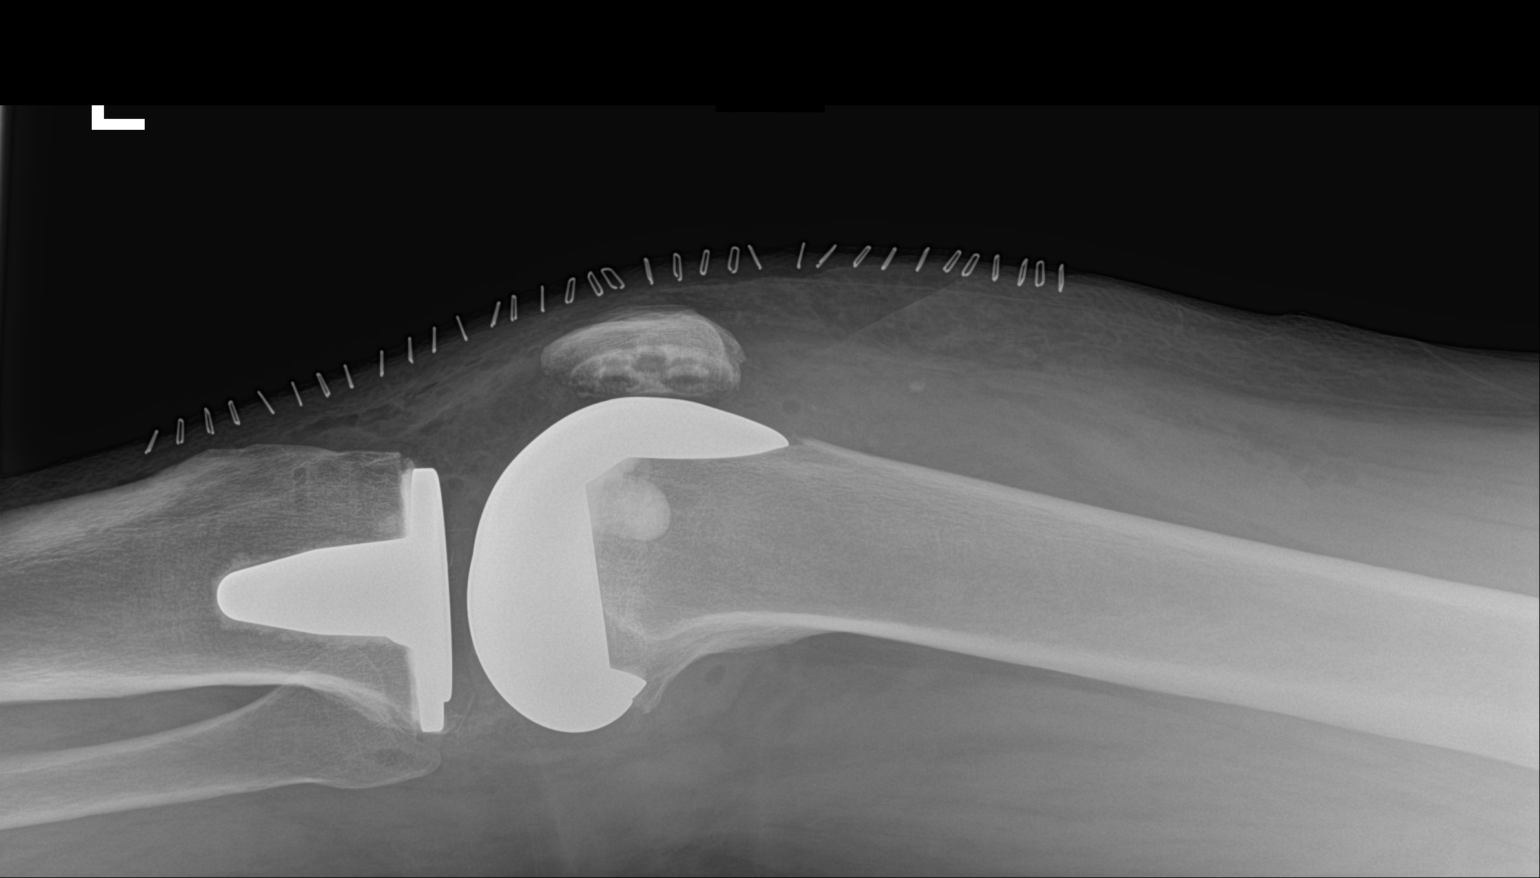

[2 of 2 positions shown; findings below may reference images not displayed]

FINDINGS: The knee demonstrates a total knee arthroplasty without evidence of
hardware failure or complication. There is expected intra-articular
air. There is no fracture or dislocation. The alignment is anatomic.
Post-surgical changes noted in the surrounding soft tissues.
IMPRESSION: 1. Left total knee arthroplasty without evidence of hardware
complication.

## 2023-09-05 ENCOUNTER — Other Ambulatory Visit: Payer: Self-pay | Admitting: Cardiovascular Disease

## 2023-12-03 ENCOUNTER — Encounter: Payer: Self-pay | Admitting: Radiology

## 2024-01-11 NOTE — ED Provider Notes (Signed)
 "                                                                                   Emergency Department Provider Note    ED Clinical Impression   Final diagnoses:  Colitis (Primary)    ED Assessment/Plan    Condition: Stable Disposition: Discharge  This chart has been completed using Dragon Medical Dictation software, and while attempts have been made to ensure accuracy, certain words and phrases may not be transcribed as intended.   History   Chief Complaint  Patient presents with   Abdominal Pain    Abdominal Pain Associated symptoms: chills, diarrhea, nausea and vomiting     Brent Nguyen. is a 58 y.o. male  who presents today to the  emergency department with concerns for acute onset diffuse abdominal pain, vomiting and what he describes as black diarrhea.  He initially thought it was secondary to food poisoning.  He and his wife ate [employer] bacon later last night and he had a milkshake.  Approximately 5 hours later he was awakened in bed around midnight with all the above symptoms.  He reports cleaning himself up feeling a little better after the initial way went back to bed and 4 hours later he awakened to the same.  He has been having bouts of bloody diarrhea throughout the day.  Patient reports a history of a colon resection in 2000.    Allergies: is allergic to penicillins. Medications: is not on any long-term medications. PMHx:  has no past medical history on file. PSHx:  has no past surgical history on file. SocHx:   Allergies, Medications, Medical, Surgical, and Social History were reviewed as documented above.   Social Drivers of Health with Concerns   Food Insecurity: Not on file  Tobacco Use: High Risk (02/14/2023)   Received from [facility]   Patient History    Smoking Tobacco Use: Every Day    Smokeless Tobacco Use: Former    Passive Exposure: Not on file  Transportation Needs: Not on file  Alcohol Use: Not on file  Housing: Not on file   Physical Activity: Not on file  Utilities: Not on file  Stress: Not on file  Interpersonal Safety: Not on file  Substance Use: Not on file (01/11/2024)  Intimate Partner Violence: Unknown (05/05/2021)   Received from [facility]   HITS    Physically Hurt: Not on file    Insult or Talk Down To: Not on file    Threaten Physical Harm: Not on file    Scream or Curse: Not on file  Social Connections: Unknown (06/14/2021)   Received from [employer]   Social Network    Social Network: Not on file  Financial Resource Strain: Not on file  Health Literacy: Not on file  Internet Connectivity: Not on file     Review Of Systems  Review of Systems  Constitutional:  Positive for chills and diaphoresis.  HENT: Negative.    Eyes: Negative.   Respiratory: Negative.    Cardiovascular: Negative.   Gastrointestinal:  Positive for abdominal pain, blood in stool, diarrhea, nausea and vomiting.  Genitourinary: Negative.   Musculoskeletal:  Negative.   Skin: Negative.   Allergic/Immunologic: Negative.   Neurological: Negative.   Hematological: Negative.   Psychiatric/Behavioral: Negative.    All other systems reviewed and are negative.   Physical Exam   BP 143/90   Pulse 91   Temp 36.9 C (98.4 F) (Temporal)   Resp 18   Ht 185.4 cm (6' 1)   Wt (!) 108.1 kg (238 lb 6.4 oz)   SpO2 97%   BMI 31.45 kg/m   Physical Exam Vitals and nursing note reviewed.  Constitutional:      General: He is not in acute distress.    Appearance: Normal appearance. He is well-developed. He is not ill-appearing, toxic-appearing or diaphoretic.  HENT:     Head: Normocephalic and atraumatic.     Right Ear: External ear normal.     Left Ear: External ear normal.     Nose: Nose normal.     Mouth/Throat:     Mouth: Mucous membranes are moist.  Eyes:     Conjunctiva/sclera: Conjunctivae normal.  Cardiovascular:     Rate and Rhythm: Normal rate and regular rhythm.     Pulses: Normal pulses.      Heart sounds: Normal heart sounds.  Pulmonary:     Effort: Pulmonary effort is normal.     Breath sounds: Normal breath sounds.  Abdominal:     General: Abdomen is flat. A surgical scar is present. Bowel sounds are normal.     Palpations: Abdomen is soft.     Tenderness: There is abdominal tenderness in the left lower quadrant.     Hernia: No hernia is present.  Musculoskeletal:        General: Normal range of motion.     Cervical back: Normal range of motion and neck supple.  Skin:    General: Skin is warm and dry.  Neurological:     General: No focal deficit present.     Mental Status: He is alert and oriented to person, place, and time. Mental status is at baseline.     Cranial Nerves: No cranial nerve deficit.     Motor: No weakness.  Psychiatric:        Mood and Affect: Mood normal. Mood is not anxious or depressed.        Behavior: Behavior normal.        Thought Content: Thought content normal.        Judgment: Judgment normal.     ED Course  Medical Decision Making Differential diagnose includes: Acute food poisoning, colitis, viral gastroenteritis, diverticulitis, small bowel obstruction lower GI bleed  Kahne Helfand Camilo Nguyen. is a 58 y.o. male  who presents today to the  emergency department with concerns for acute onset diffuse abdominal pain, vomiting and what he describes as black diarrhea.  He initially thought it was secondary to food poisoning.  He and his wife ate [employer] bacon later last night and he had a milkshake.  Approximately 5 hours later he was awakened in bed around midnight with all the above symptoms.  -Lipase resulted elevated at 342  - CBC and all of the chemistries unremarkable  - PT PTT normal  - CTA results pending  -CTA abdomen pelvis per radiology demonstrated no evidence of active arterial GI bleed.  Acute colitis at the level of the splenic flexure.  Findings most likely infectious or inflammatory in etiology.    - We discussed results at  the bedside.  Patient resting comfortably demonstrating no distress.  He  has had no recurrent vomiting and no diarrhea since his arrival.  He reports no continued rectal bleeding.  Patient given Cipro and Flagyl p.o.  - He will follow-up with his primary care physician for reevaluation return to the emergency department symptoms worsen  Amount and/or Complexity of Data Reviewed Labs: ordered. Decision-making details documented in ED Course. Radiology: ordered. Decision-making details documented in ED Course.  Risk Prescription drug management.     Procedures   No results found for this visit on 01/11/24 (from the past 4464 hours).   ED Results Results for orders placed or performed during the hospital encounter of 01/11/24  Comprehensive Metabolic Panel  Result Value Ref Range   Sodium 139 135 - 145 mmol/L   Potassium 4.0 3.5 - 5.0 mmol/L   Chloride 102 98 - 107 mmol/L   CO2 26.5 21.0 - 32.0 mmol/L   Anion Gap 11 3 - 11 mmol/L   BUN 13 8 - 20 mg/dL   Creatinine 9.03 9.19 - 1.30 mg/dL   BUN/Creatinine Ratio 14    eGFR CKD-EPI (2021) Male >90 >=60 mL/min/1.30m2   Glucose 103 70 - 179 mg/dL   Calcium 9.3 8.5 - 89.8 mg/dL   Albumin 4.1 3.5 - 5.0 g/dL   Total Protein 8.7 (H) 6.0 - 8.0 g/dL   Total Bilirubin 1.2 0.3 - 1.2 mg/dL   AST 24 15 - 40 U/L   ALT 30 12 - 78 U/L   Alkaline Phosphatase 84 46 - 116 U/L  Lipase  Result Value Ref Range   Lipase 342 (H) 16 - 77 U/L  Magnesium  Result Value Ref Range   Magnesium 2.1 1.6 - 2.6 mg/dL  PT-INR  Result Value Ref Range   PT 12.4 9.9 - 12.6 sec   INR 1.12 Undefined  PTT  Result Value Ref Range   APTT 35.3 24.8 - 38.4 sec  TSH  Result Value Ref Range   TSH 2.499 0.550 - 4.780 uIU/mL  CBC w/ Differential  Result Value Ref Range   WBC 9.6 4.0 - 10.5 10*9/L   RBC 5.53 4.10 - 5.60 10*12/L   HGB 16.6 12.5 - 17.0 g/dL   HCT 50.1 63.9 - 49.9 %   MCV 90.1 80.0 - 98.0 fL   MCH 30.0 27.0 - 34.0 pg   MCHC 33.3 32.0 - 36.0 g/dL    RDW 86.9 88.4 - 85.4 %   MPV 9.7 7.4 - 10.4 fL   Platelet 289 140 - 415 10*9/L   Neutrophils % 73.5 %   Lymphocytes % 20.1 %   Monocytes % 5.2 %   Eosinophils % 0.5 %   Basophils % 0.5 %   Absolute Neutrophils 7.1 1.8 - 7.8 10*9/L   Absolute Lymphocytes 1.9 0.7 - 4.5 10*9/L   Absolute Monocytes 0.5 0.1 - 1.0 10*9/L   Absolute Eosinophils 0.1 0.0 - 0.4 10*9/L   Absolute Basophils 0.1 0.0 - 0.2 10*9/L   CTA Abdomen Pelvis GI Bleed Result Date: 01/11/2024 Exam:  CT Angiogram of the Abdomen and Pelvis  History:  Evaluate for GI bleed  Technique:  CT angiography of the abdomen and pelvis without and with IV contrast. Image postprocessing and 3-D reconstructions at an independent workstation is performed and the rendered images that are generated are sent to the patient's image file in PACS. AEC (automated exposure control) and/or manual techniques such as size-specific kV and mAs are employed where appropriate to reduce radiation exposure for all CT exams.  Comparison:  None  Abdomen and Pelvis CT Findings:  VASCULAR:  Normal caliber abdominal aorta with calcified atheromatous plaque. Patent portal vasculature.  LOWER THORAX:  The lung bases are clear.  HEPATOBILIARY:  No abnormal findings.  PANCREAS:  Normal.  SPLEEN:  Normal.  ADRENALS:  Normal.  KIDNEYS/URETERS:  Normal.  LYMPH NODES:  No adenopathy.  BOWEL/MESENTERY:  Focal wall thickening and inflammatory stranding at the level of the splenic flexure. No bowel obstruction. Appendix is normal. No evidence of active arterial GI bleeding.  PELVIC ORGANS:  Enlarged prostate. Urinary bladder is unremarkable.  BONES/SOFT TISSUES:  L4-S1 posterior instrumented fusion. No acute osseous abnormality.     - No evidence of active arterial GI bleeding.  - Acute colitis at the level of the splenic flexure. Findings are most likely infectious or inflammatory in etiology, although ischemic etiology is not excluded.  Signed (Electronic Signature): 01/11/2024  5:37 PM Signed By: Reyes Going, MD   Medications Administered:  Medications  ciprofloxacin HCl (CIPRO) tablet 500 mg (has no administration in time range)  metroNIDAZOLE (FLAGYL) tablet 500 mg (has no administration in time range)  iohexol  (OMNIPAQUE ) 350 mg iodine/mL solution 100 mL (100 mL Intravenous Given 01/11/24 1727)  morphine 4 mg/mL injection 4 mg (4 mg Intravenous Given 01/11/24 1731)  ondansetron  (ZOFRAN ) injection 4 mg (4 mg Intravenous Given 01/11/24 1731)    Discharge Medications (Medications Prescribed during this  ED visit and Patient's Home Medications) :    Your Medication List     START taking these medications    ciprofloxacin HCl 500 MG tablet Commonly known as: CIPRO Take 1 tablet (500 mg total) by mouth two (2) times a day for 10 days.   metroNIDAZOLE 500 MG tablet Commonly known as: FLAGYL Take 1 tablet (500 mg total) by mouth two (2) times a day for 10 days.          Merilee Lani Sharper, MD 01/11/24 [phone]  "

## 2024-02-04 ENCOUNTER — Ambulatory Visit: Payer: Self-pay

## 2024-02-04 VITALS — BP 114/82 | HR 78 | Ht 73.0 in | Wt 229.1 lb

## 2024-02-04 DIAGNOSIS — K529 Noninfective gastroenteritis and colitis, unspecified: Secondary | ICD-10-CM

## 2024-02-04 DIAGNOSIS — I10 Essential (primary) hypertension: Secondary | ICD-10-CM | POA: Diagnosis not present

## 2024-02-04 DIAGNOSIS — K5790 Diverticulosis of intestine, part unspecified, without perforation or abscess without bleeding: Secondary | ICD-10-CM | POA: Diagnosis not present

## 2024-02-04 NOTE — Progress Notes (Signed)
 "  New Patient Office Visit  Subjective    Patient ID: Brent Nguyen, male    DOB: [birth year]  Age: 59 y.o. MRN: [id number]  CC:  Chief Complaint  Patient presents with   Establish Care    Pt here to establish care    HPI Brent Nguyen presents to establish care Discussed the use of AI scribe software for clinical note transcription with the patient, who gave verbal consent to proceed.  History of Present Illness    Brent Nguyen is a 59 year old male who presents for establishment of care and referral to a gastroenterologist.  Abdominal pain and gastrointestinal bleeding - Severe abdominal pain, nausea, and sweating began on December 12th, initially suspected to be food poisoning - Subsequent diarrhea with dark, foul-smelling stools and significant rectal bleeding - CT scan at hospital diagnosed colitis - Treated with ciprofloxacin and metronidazole; metronidazole caused grogginess and appetite issues  Colorectal surgical history - History of colon resection in 2000 due to diverticulitis  Hypertension - Hypertension first identified after experiencing dizziness and elevated blood pressure while working at [employer] - Recorded blood pressure of 168/124 at that time - Currently taking antihypertensive medication with adequate supply - High stress related to work and family issues contributing to blood pressure elevation  Upper respiratory symptoms - Recent head cold with congestion and clear nasal discharge - No fever or other systemic symptoms - Onset attributed to weather changes and exposure to a sick coworker of his wife     Outpatient Encounter Medications as of 02/04/2024  Medication Sig   amLODipine  (NORVASC ) 10 MG tablet TAKE 1 TABLET BY MOUTH EVERY DAY   carvedilol  (COREG ) 25 MG tablet TAKE 1 TABLET (25 MG TOTAL) BY MOUTH TWICE A DAY WITH MEALS   losartan  (COZAAR ) 25 MG tablet TAKE 1 TABLET (25 MG TOTAL) BY MOUTH DAILY CALL [phone] SCHEDULE  APPOINTMENT FOR FUTURE REFILL   No facility-administered encounter medications on file as of 02/04/2024.   Past Medical History:  Diagnosis Date   Chronic back pain    Diverticulosis    a. s/p partial colectomy 2005 for anastamotic leak with reversal.   History of kidney stones    Hyperlipidemia    Hypertension    Normal cardiac stress test 2015   low risk   Obesity    Sciatica, left side    Tobacco abuse     Past Surgical History:  Procedure Laterality Date   BACK SURGERY  2021   two-level instrumented fusion without interbody cages in [place]    COLOSTOMY REVERSAL     KNEE ARTHROSCOPY Right    KNEE ARTHROSCOPY Right 01/20/2013   Procedure: ARTHROSCOPY KNEE;  Surgeon: Lemond Stable, MD;  Location: [facility];  Service: Orthopedics;  Laterality: Right;   MENISECTOMY Right 01/20/2013   Procedure: partial medial menisectomy;  Surgeon: Lemond Stable, MD;  Location: [facility];  Service: Orthopedics;  Laterality: Right;   PARTIAL COLECTOMY     2 subsequent surgeries to reverse his colostomy-diverticulosis   TOTAL KNEE ARTHROPLASTY Left 04/04/2021   Procedure: Left TOTAL KNEE ARTHROPLASTY;  Surgeon: Barbarann Oneil BROCKS, MD;  Location: [facility];  Service: Orthopedics;  Laterality: Left;   WISDOM TOOTH EXTRACTION      Family History  Problem Relation Age of Onset   Hypertension Mother    Diverticulosis Mother    Sleep apnea Mother    Celiac disease Mother    Ulcers Mother    Cancer Father  Brain   Diverticulosis Brother    Ulcers Brother    Celiac disease Brother    Hypotension Brother    Cancer Maternal Grandmother        Breast   Ulcers Maternal Grandfather    ALS Maternal Grandfather    Diabetes Maternal Grandfather    Heart attack Paternal Grandmother    Hypertension Paternal Grandmother    Cancer Paternal Grandfather        Melanoma   Diverticulosis Daughter     Social History   Socioeconomic History   Marital status: Married    Spouse name: Not on file   Number of  children: Not on file   Years of education: Not on file   Highest education level: Not on file  Occupational History   Not on file  Tobacco Use   Smoking status: Every Day    Current packs/day: 1.00    Average packs/day: 1 pack/day for 25.0 years (25.0 ttl pk-yrs)    Types: Cigarettes   Smokeless tobacco: Former    Types: Engineer, Drilling   Vaping status: Some Days   Substances: THC  Substance and Sexual Activity   Alcohol use: Yes    Comment: On occasion, 1 or 2 beers per month   Drug use: Yes    Types: Marijuana   Sexual activity: Yes    Birth control/protection: None  Other Topics Concern   Not on file  Social History Narrative   Not on file   Social Drivers of Health   Tobacco Use: High Risk (02/04/2024)   Patient History    Smoking Tobacco Use: Every Day    Smokeless Tobacco Use: Former    Passive Exposure: Not on Actuary Strain: Not on file  Food Insecurity: No Food Insecurity (02/04/2024)   Epic    Worried About Programme Researcher, Broadcasting/film/video in the Last Year: Never true    Ran Out of Food in the Last Year: Never true  Transportation Needs: No Transportation Needs (02/04/2024)   Epic    Lack of Transportation (Medical): No    Lack of Transportation (Non-Medical): No  Physical Activity: Not on file  Stress: Not on file  Social Connections: Unknown (06/14/2021)   [facility]   Social Network    Social Network: Not on file  Intimate Partner Violence: Not At Risk (02/04/2024)   Epic    Fear of Current or Ex-Partner: No    Emotionally Abused: No    Physically Abused: No    Sexually Abused: No  Depression (PHQ2-9): Not on file  Alcohol Screen: Not on file  Housing: Low Risk (02/04/2024)   Epic    Unable to Pay for Housing in the Last Year: No    Number of Times Moved in the Last Year: 0    Homeless in the Last Year: No  Utilities: Not At Risk (02/04/2024)   Epic    Threatened with loss of utilities: No  Health Literacy: Not on file    ROS    Objective    BP 114/82 (BP Location: Left Arm, Patient Position: Sitting, Cuff Size: Normal)   Pulse 78   Ht 6' 1 (1.854 m)   Wt 229 lb 1.3 oz (103.9 kg)   SpO2 92%   BMI 30.22 kg/m   Physical Exam Vitals and nursing note reviewed.  Constitutional:      Appearance: Normal appearance.  HENT:     Head: Normocephalic.  Eyes:  Extraocular Movements: Extraocular movements intact.     Pupils: Pupils are equal, round, and reactive to light.  Cardiovascular:     Rate and Rhythm: Normal rate and regular rhythm.  Pulmonary:     Effort: Pulmonary effort is normal.     Breath sounds: Normal breath sounds.  Musculoskeletal:     Cervical back: Normal range of motion and neck supple.  Neurological:     Mental Status: He is alert and oriented to person, place, and time.  Psychiatric:        Mood and Affect: Mood normal.        Thought Content: Thought content normal.     Last CBC Lab Results  Component Value Date   WBC 7.1 02/14/2023   HGB 16.4 02/14/2023   HCT 49.2 02/14/2023   MCV 89.5 02/14/2023   MCH 29.8 02/14/2023   RDW 13.0 02/14/2023   PLT 269 02/14/2023   Last metabolic panel Lab Results  Component Value Date   GLUCOSE 98 02/14/2023   NA 137 02/14/2023   K 4.3 02/14/2023   CL 101 02/14/2023   CO2 29 02/14/2023   BUN 13 02/14/2023   CREATININE 0.77 02/14/2023   GFRNONAA >60 02/14/2023   CALCIUM 9.3 02/14/2023   PROT 7.9 02/14/2023   ALBUMIN 4.2 02/14/2023   LABGLOB 2.5 10/08/2019   AGRATIO 1.9 10/08/2019   BILITOT 1.1 02/14/2023   ALKPHOS 65 02/14/2023   AST 22 02/14/2023   ALT 21 02/14/2023   ANIONGAP 7 02/14/2023   Last lipids Lab Results  Component Value Date   CHOL 170 02/14/2023   HDL 37 (L) 02/14/2023   LDLCALC 123 (H) 02/14/2023   TRIG 48 02/14/2023   CHOLHDL 4.6 02/14/2023      Assessment & Plan:   Problem List Items Addressed This Visit       Cardiovascular and Mediastinum   HTN (hypertension), severe    Hypertension managed with medication. Blood pressure elevated during visit but improved upon recheck. Stress and anxiety may contribute. - Continue current antihypertensive medication regimen.        Digestive   Diverticulosis s/p partial colectomy with reversal - Primary   Diverticulosis status post bowel resection Diverticulosis with previous bowel resection in 2000. No recent endoscopic evaluation. - Referred to gastroenterology for evaluation and potential endoscopy.      Relevant Orders   Ambulatory referral to Gastroenterology   Colitis   Acute colitis with significant gastrointestinal bleeding confirmed by CT scan. Previous treatment with Cipro and Flagyl not tolerated. - Referred to gastroenterology for further evaluation and management. - Advised follow-up with GI specialist for potential endoscopy.      Relevant Orders   Ambulatory referral to Gastroenterology    Return in about 6 months (around 08/03/2024) for chronic follow-up with PCP.   Leita Longs, FNP   "

## 2024-02-05 DIAGNOSIS — K529 Noninfective gastroenteritis and colitis, unspecified: Secondary | ICD-10-CM | POA: Insufficient documentation

## 2024-02-05 NOTE — Assessment & Plan Note (Signed)
 Hypertension managed with medication. Blood pressure elevated during visit but improved upon recheck. Stress and anxiety may contribute. - Continue current antihypertensive medication regimen.

## 2024-02-05 NOTE — Assessment & Plan Note (Signed)
 Diverticulosis status post bowel resection Diverticulosis with previous bowel resection in 2000. No recent endoscopic evaluation. - Referred to gastroenterology for evaluation and potential endoscopy.

## 2024-02-05 NOTE — Assessment & Plan Note (Signed)
 Acute colitis with significant gastrointestinal bleeding confirmed by CT scan. Previous treatment with Cipro and Flagyl not tolerated. - Referred to gastroenterology for further evaluation and management. - Advised follow-up with GI specialist for potential endoscopy.

## 2024-02-07 ENCOUNTER — Other Ambulatory Visit: Payer: Self-pay | Admitting: Cardiovascular Disease

## 2024-02-20 ENCOUNTER — Other Ambulatory Visit: Payer: Self-pay | Admitting: Cardiovascular Disease

## 2024-02-25 ENCOUNTER — Encounter (INDEPENDENT_AMBULATORY_CARE_PROVIDER_SITE_OTHER): Payer: Self-pay

## 2024-02-25 ENCOUNTER — Ambulatory Visit (INDEPENDENT_AMBULATORY_CARE_PROVIDER_SITE_OTHER): Admitting: Gastroenterology

## 2024-03-04 ENCOUNTER — Other Ambulatory Visit: Payer: Self-pay | Admitting: Cardiovascular Disease

## 2024-08-04 ENCOUNTER — Ambulatory Visit: Payer: Self-pay
